# Patient Record
Sex: Male | Born: 1935
Health system: Southern US, Community
[De-identification: ages and names within clinical notes are randomized; demographics above are authoritative.]

## PROBLEM LIST (undated history)

## (undated) DIAGNOSIS — F329 Major depressive disorder, single episode, unspecified: Secondary | ICD-10-CM

## (undated) DIAGNOSIS — Z8249 Family history of ischemic heart disease and other diseases of the circulatory system: Secondary | ICD-10-CM

## (undated) DIAGNOSIS — G459 Transient cerebral ischemic attack, unspecified: Secondary | ICD-10-CM

## (undated) DIAGNOSIS — I350 Nonrheumatic aortic (valve) stenosis: Secondary | ICD-10-CM

## (undated) DIAGNOSIS — Z8781 Personal history of (healed) traumatic fracture: Secondary | ICD-10-CM

## (undated) DIAGNOSIS — F32A Depression, unspecified: Secondary | ICD-10-CM

## (undated) DIAGNOSIS — Z9889 Other specified postprocedural states: Secondary | ICD-10-CM

## (undated) DIAGNOSIS — K219 Gastro-esophageal reflux disease without esophagitis: Secondary | ICD-10-CM

## (undated) DIAGNOSIS — Z860100 Personal history of colon polyps, unspecified: Secondary | ICD-10-CM

## (undated) DIAGNOSIS — R112 Nausea with vomiting, unspecified: Secondary | ICD-10-CM

## (undated) DIAGNOSIS — I10 Essential (primary) hypertension: Secondary | ICD-10-CM

## (undated) DIAGNOSIS — F1011 Alcohol abuse, in remission: Secondary | ICD-10-CM

## (undated) DIAGNOSIS — E7800 Pure hypercholesterolemia, unspecified: Secondary | ICD-10-CM

## (undated) DIAGNOSIS — K5790 Diverticulosis of intestine, part unspecified, without perforation or abscess without bleeding: Secondary | ICD-10-CM

## (undated) DIAGNOSIS — F419 Anxiety disorder, unspecified: Secondary | ICD-10-CM

## (undated) HISTORY — DX: Pure hypercholesterolemia, unspecified: E78.00

## (undated) HISTORY — DX: Personal history of colon polyps, unspecified: Z86.0100

## (undated) HISTORY — PX: COLONOSCOPY: SHX174

## (undated) HISTORY — DX: Alcohol abuse, in remission: F10.11

## (undated) HISTORY — DX: Depression, unspecified: F32.A

## (undated) HISTORY — DX: Anxiety disorder, unspecified: F41.9

## (undated) HISTORY — DX: Essential (primary) hypertension: I10

## (undated) HISTORY — DX: Gastro-esophageal reflux disease without esophagitis: K21.9

## (undated) HISTORY — DX: Diverticulosis of intestine, part unspecified, without perforation or abscess without bleeding: K57.90

## (undated) HISTORY — DX: Major depressive disorder, single episode, unspecified: F32.9

## (undated) HISTORY — DX: Personal history of colonic polyps: Z86.010

## (undated) HISTORY — DX: Personal history of (healed) traumatic fracture: Z87.81

## (undated) HISTORY — DX: Family history of ischemic heart disease and other diseases of the circulatory system: Z82.49

## (undated) HISTORY — DX: Nonrheumatic aortic (valve) stenosis: I35.0

## (undated) HISTORY — DX: Transient cerebral ischemic attack, unspecified: G45.9

---

## 1998-11-23 ENCOUNTER — Ambulatory Visit (HOSPITAL_COMMUNITY): Admission: RE | Admit: 1998-11-23 | Discharge: 1998-11-23 | Payer: Self-pay | Admitting: *Deleted

## 2003-07-25 ENCOUNTER — Ambulatory Visit (HOSPITAL_COMMUNITY): Admission: RE | Admit: 2003-07-25 | Discharge: 2003-07-25 | Payer: Self-pay | Admitting: *Deleted

## 2005-12-04 DIAGNOSIS — G459 Transient cerebral ischemic attack, unspecified: Secondary | ICD-10-CM

## 2005-12-04 HISTORY — DX: Transient cerebral ischemic attack, unspecified: G45.9

## 2005-12-13 ENCOUNTER — Observation Stay (HOSPITAL_COMMUNITY): Admission: EM | Admit: 2005-12-13 | Discharge: 2005-12-14 | Payer: Self-pay | Admitting: Emergency Medicine

## 2005-12-14 ENCOUNTER — Ambulatory Visit: Payer: Self-pay | Admitting: Cardiovascular Disease

## 2010-07-23 ENCOUNTER — Inpatient Hospital Stay (HOSPITAL_COMMUNITY)
Admission: EM | Admit: 2010-07-23 | Discharge: 2010-07-26 | DRG: 603 | Disposition: A | Discharge status: Home / Self Care | Payer: MEDICARE | Attending: Internal Medicine | Admitting: Internal Medicine

## 2010-07-23 DIAGNOSIS — F329 Major depressive disorder, single episode, unspecified: Secondary | ICD-10-CM | POA: Diagnosis present

## 2010-07-23 DIAGNOSIS — I10 Essential (primary) hypertension: Secondary | ICD-10-CM | POA: Diagnosis present

## 2010-07-23 DIAGNOSIS — E785 Hyperlipidemia, unspecified: Secondary | ICD-10-CM | POA: Diagnosis present

## 2010-07-23 DIAGNOSIS — F3289 Other specified depressive episodes: Secondary | ICD-10-CM | POA: Diagnosis present

## 2010-07-23 DIAGNOSIS — W540XXA Bitten by dog, initial encounter: Secondary | ICD-10-CM | POA: Diagnosis present

## 2010-07-23 DIAGNOSIS — L02519 Cutaneous abscess of unspecified hand: Principal | ICD-10-CM | POA: Diagnosis present

## 2010-07-23 DIAGNOSIS — R6883 Chills (without fever): Secondary | ICD-10-CM | POA: Diagnosis present

## 2010-07-23 DIAGNOSIS — S6990XA Unspecified injury of unspecified wrist, hand and finger(s), initial encounter: Secondary | ICD-10-CM | POA: Diagnosis present

## 2010-07-23 DIAGNOSIS — Z8673 Personal history of transient ischemic attack (TIA), and cerebral infarction without residual deficits: Secondary | ICD-10-CM

## 2010-07-23 LAB — CBC
Hemoglobin: 13.8 g/dL (ref 13.0–17.0)
MCHC: 33.3 g/dL (ref 30.0–36.0)
RBC: 4.51 MIL/uL (ref 4.22–5.81)
RDW: 12.5 % (ref 11.5–15.5)
WBC: 7.9 10*3/uL (ref 4.0–10.5)

## 2010-07-23 LAB — DIFFERENTIAL
Basophils Relative: 0 % (ref 0–1)
Eosinophils Absolute: 0.1 10*3/uL (ref 0.0–0.7)
Eosinophils Relative: 1 % (ref 0–5)
Lymphs Abs: 2 10*3/uL (ref 0.7–4.0)
Monocytes Absolute: 0.7 10*3/uL (ref 0.1–1.0)
Monocytes Relative: 9 % (ref 3–12)
Neutro Abs: 5.1 10*3/uL (ref 1.7–7.7)

## 2010-07-23 LAB — COMPREHENSIVE METABOLIC PANEL
ALT: 23 U/L (ref 0–53)
AST: 22 U/L (ref 0–37)
GFR calc Af Amer: >60 mL/min (ref >60)
GFR calc non Af Amer: >60 mL/min (ref >60)
Potassium: 4 mEq/L (ref 3.5–5.1)
Sodium: 136 mEq/L (ref 135–145)
Total Bilirubin: 0.9 mg/dL (ref 0.3–1.2)

## 2010-07-24 ENCOUNTER — Inpatient Hospital Stay (HOSPITAL_COMMUNITY): Payer: MEDICARE

## 2010-07-24 LAB — URINALYSIS, ROUTINE W REFLEX MICROSCOPIC
Nitrite: NEGATIVE
Protein, ur: NEGATIVE mg/dL
Specific Gravity, Urine: 1.021 (ref 1.005–1.030)
Urine Glucose, Fasting: NEGATIVE mg/dL
pH: 5.5 (ref 5.0–8.0)

## 2010-07-24 LAB — CBC
HCT: 39.5 % (ref 39.0–52.0)
Hemoglobin: 13 g/dL (ref 13.0–17.0)
Platelets: 196 10*3/uL (ref 150–400)
WBC: 7.8 10*3/uL (ref 4.0–10.5)

## 2010-07-24 LAB — BASIC METABOLIC PANEL
BUN: 13 mg/dL (ref 6–23)
CO2: 26 mEq/L (ref 19–32)
Glucose, Bld: 103 mg/dL — ABNORMAL HIGH (ref 70–99)
Potassium: 3.8 mEq/L (ref 3.5–5.1)
Sodium: 134 mEq/L — ABNORMAL LOW (ref 135–145)

## 2010-07-24 LAB — URINE MICROSCOPIC-ADD ON: Urine-Other: NONE SEEN

## 2010-07-25 LAB — URINE CULTURE
Colony Count: NO GROWTH
Culture  Setup Time: 201202182024
Culture: NO GROWTH

## 2010-07-25 NOTE — H&P (Signed)
NAME:  Peter Gallegos, Peter Gallegos NO.:  [id number]  MEDICAL RECORD NO.:  [id number]           PATIENT TYPE:  E  LOCATION:  WLED                         FACILITY:  [facility]  PHYSICIAN:  Arne Cleveland, MD       DATE OF BIRTH:  [birth year]  DATE OF ADMISSION:  07/23/2010 DATE OF DISCHARGE:                             HISTORY & PHYSICAL   PRIMARY CARE PHYSICIAN:  Soyla Murphy. Renne Crigler, M.D.  CHIEF COMPLAINT:  Chills.  HISTORY OF PRESENT ILLNESS:  A 77 year old Caucasian male who had a history of dog bite 6 days ago.  He went and saw his primary care doctor 5 days ago and was seen by his PA who started him on Augmentin, Vicodin and Phenergan for the dog bite and wrapped up the hand and forearm because of the swelling.  The patient was doing fairly well and had the wound checked today at the doctor's office and everything seemed to be going well.  He did noted that he had some nausea and less anergy than usual but he felt that might be secondary to the antibiotics that he was on.  The swelling had decreased in size.  The wound looked fairly well. He took the wrapping out and he went home but at home he had 2 spells of bone shaking chills and he got concerned and called his primary care physician and was told to come to the emergency room.  PAST MEDICAL HISTORY:  Significant for: 1. Hypertension. 2. Transient ischemic event. 3. Hyperlipidemia. 4. Depression.  PAST SURGICAL HISTORY:  Negative.  FAMILY HISTORY:  Noncontributory.  SOCIAL HISTORY:  Is married, retired Pensions consultant.  He does not smoke, drink or use illicit drugs.  He does have a history of smoking of pipe for 10 years, he stopped smoking 25 years ago.  REVIEW OF SYSTEMS:  As per history of present illness.  PHYSICAL EXAMINATION:  GENERAL:  Well-developed, well-nourished, no acute distress. VITAL SIGNS:  Blood pressure 131/78, pulse 89, respirations 20, temperature 97.9. HEAD:  Examination of his head is atraumatic,  normocephalic. EYES:  Pupils equal, round, reactive to light.  Disks sharp. Extraocular muscle range of motion full. EAR, NOSE AND THROAT:  Mucous membranes are moist and there is no erythema or exudate or lesion noted.  NECK:  Supple without jugular venous distention, thyromegaly or thyroid mass. CHEST:  Nontender. LUNGS:  Clear to auscultation and percussion. HEART:  Regular rhythm and rate.  Normal S1, S2 without murmur, gallop or rub. ABDOMEN:  Soft, nontender.  Normoactive bowel sound.  No hepatomegaly. No splenomegaly.  No palpable mass.  GENITAL EXAM:  Normal. RECTAL EXAM:  Deferred at the patient's request. EXTREMITIES:  There is no clubbing, cyanosis or edema. NEURO:  Awake, alert and oriented x3.  Cranial nerves II-XII intact. Motor strength 5/5 in upper and lower extremities.  Sensory intact to fine touch and pinprick. SKIN:  His right hand has some swelling, 2 puncture wounds which appear to be healing well and some swelling and mild erythema of the right forearm.  IMPRESSION: 1. Dog bite right hand. 2. Right hand and forearm cellulitis.  3. Chills, new onset rule out sepsis.  PLAN:  My plan is to admit the patient for IV antibiotics.  Obtain blood cultures and monitor his course.  The patient's routine meds will be continued except for the Augmentin that will be discontinued and he will be put on Unasyn.  Further evaluation and treatment as indicated by hospital course.  The patient will be admitted to [facility] 3.     Arne Cleveland, MD     ML/MEDQ  D:  07/23/2010  T:  07/23/2010  Job:  [id number]  cc:   Soyla Murphy. Renne Crigler, M.D. Fax: [phone]  Electronically Signed by Arne Cleveland  on 07/25/2010 06:35:43 AM

## 2010-07-30 LAB — CULTURE, BLOOD (ROUTINE X 2)
Culture  Setup Time: 201202181732
Culture: NO GROWTH

## 2010-08-19 NOTE — Discharge Summary (Signed)
  NAME:  Peter Gallegos, Peter Gallegos                ACCOUNT NO.:  [id number]  MEDICAL RECORD NO.:  [id number]           PATIENT TYPE:  I  LOCATION:  1303                         FACILITY:  [facility]  PHYSICIAN:  Dayshaun Whobrey I Jakwan Sally, MD      DATE OF BIRTH:  10-19-1932  DATE OF ADMISSION:  07/23/2010 DATE OF DISCHARGE:  07/26/2010                              DISCHARGE SUMMARY   DISCHARGE DIAGNOSES: 1. Right hand dog bite. 2. Right hand cellulitis secondary to dog bite. 3. Febrile illness secondary to dog bite and cellulitis. 4. History of transient ischemic attack. 5. History of hypertension. 6. Depression and hyperlipidemia.  PROCEDURE:  X-ray of the right hand soft tissue swelling over the dorsum hand without underlying fracture, mild osteopenia without focal bone destruction to suggest osteomyelitis.  HISTORY OF PRESENT ILLNESS:  This is a 77 year old male who had a history of dog bite 6 days prior to admission.  He went and saw his primary care doctor who has started him on Augmentin, Vicodin, and Phenergan.  The patient was doing fairly well and had the wound checked today at the doctor's office and every thing seemed to be doing well. He did noted that he had some nausea and less energy.  He presented with 2 spells of bone-shaking chills and he called his primary care physician.  He asked him to come to the emergency room.  In emergency room, the patient was found to have temperature 97.9, blood pressure 131/78.  He noticed he had some right hand swelling and 2 punctate wounds, which appears to be healing well and some swelling and erythema of his right forearm.  PROBLEM LIST:  Right hand cellulitis, which was treated with Unasyn IV. Blood cultures were negative.  Urine cultures were negative.  Right hand did not show any evidence of cellulitis and his hand significantly improved.  He continued to have low grade fever.  He was kept during this period for close observation and the patient had no  further fever. There is evidence of sepsis.  The patient was advised to follow up with his primary care physician next week.  On examination of his right hand, the swelling decreased and redness decreased.  There is no evidence of compartment syndrome.  DISCHARGE MEDICATIONS: 1. Aggrenox 1 capsule twice daily. 2. Augmentin 1 tablet b.i.d. for 7 days. 3. Vicodin 1 tablet q.8 h. as needed. 4. Ibuprofen q.8 h. as needed. 5. Zocor 80 mg p.o. daily. 6. Ramipril/hydrochlorothiazide 7.5/25 mg 1 tablet daily. 7. Wellbutrin XL 300 mg p.o. daily. 8. Zoloft 50 mg p.o. daily.  As I mentioned, blood culture negative, his LFTs were normal.     Emry Tobin Bosie Helper, MD     HIE/MEDQ  D:  08/18/2010  T:  08/19/2010  Job:  [id number]  cc:   Soyla Murphy. Renne Crigler, M.D. Fax: [phone]  Electronically Signed by Ebony Cargo MD on 08/19/2010 10:04:09 AM

## 2013-02-13 ENCOUNTER — Encounter: Payer: Self-pay | Admitting: Physician Assistant

## 2013-02-14 ENCOUNTER — Encounter: Payer: Self-pay | Admitting: *Deleted

## 2013-02-18 ENCOUNTER — Encounter: Payer: Self-pay | Admitting: Physician Assistant

## 2013-02-18 ENCOUNTER — Other Ambulatory Visit: Payer: Self-pay | Admitting: *Deleted

## 2013-02-18 ENCOUNTER — Ambulatory Visit (INDEPENDENT_AMBULATORY_CARE_PROVIDER_SITE_OTHER): Payer: Medicare Other | Admitting: Physician Assistant

## 2013-02-18 ENCOUNTER — Encounter: Payer: Self-pay | Admitting: Internal Medicine

## 2013-02-18 VITALS — BP 118/68 | HR 64 | Ht 62.0 in | Wt 171.4 lb

## 2013-02-18 DIAGNOSIS — D369 Benign neoplasm, unspecified site: Secondary | ICD-10-CM

## 2013-02-18 DIAGNOSIS — Z8673 Personal history of transient ischemic attack (TIA), and cerebral infarction without residual deficits: Secondary | ICD-10-CM

## 2013-02-18 DIAGNOSIS — E785 Hyperlipidemia, unspecified: Secondary | ICD-10-CM | POA: Insufficient documentation

## 2013-02-18 DIAGNOSIS — F32A Depression, unspecified: Secondary | ICD-10-CM | POA: Insufficient documentation

## 2013-02-18 DIAGNOSIS — Z86010 Personal history of colonic polyps: Secondary | ICD-10-CM

## 2013-02-18 DIAGNOSIS — Z7901 Long term (current) use of anticoagulants: Secondary | ICD-10-CM

## 2013-02-18 DIAGNOSIS — F329 Major depressive disorder, single episode, unspecified: Secondary | ICD-10-CM

## 2013-02-18 MED ORDER — MOVIPREP 100 G PO SOLR: 1.0000 | Freq: Once | ORAL | Status: DC

## 2013-02-18 NOTE — Progress Notes (Signed)
Subjective:    Patient ID: Peter Gallegos, male    DOB: 09-23-1932, 79 y.o.   MRN: [id number]  HPI  Peter Gallegos is a pleasant 79 year old white male, and new to GI today referred by Dr. Renne Crigler for colon neoplasia surveillance. Patient is previously known to Dr. Virginia Rochester and had colonoscopy in 2000 and with removal of 1 adenomatous polyp. He had followup colonoscopy in February 2005 and was noted to have scattered diverticuli throughout the colon but no recurrent polyps He was recommended for 5 year followup but has not had another procedure since then . He does have history of a TIA about 7 years ago and has been on Aggrenox. He has not had any recurrent events He says he feels well and has no current GI symptoms. Specifically no complaints of abdominal pain change in bowel habits, melena, hematochezia, or dysphagia. His  appetite has been fine and his weight has been stable.    Review of Systems  Constitutional: Negative.   HENT: Negative.   Eyes: Negative.   Respiratory: Negative.   Cardiovascular: Negative.   Gastrointestinal: Negative.   Endocrine: Negative.   Genitourinary: Negative.   Musculoskeletal: Negative.   Skin: Negative.   Allergic/Immunologic: Negative.   Neurological: Negative.   Hematological: Negative.   Psychiatric/Behavioral: Negative.    Outpatient Prescriptions Prior to Visit  Medication Sig Dispense Refill  . buPROPion (WELLBUTRIN XL) 300 MG 24 hr tablet Take 300 mg by mouth daily.      Marland Kitchen dipyridamole-aspirin (AGGRENOX) 200-25 MG per 12 hr capsule Take 1 capsule by mouth 2 (two) times daily.      . Multiple Vitamin (MULTIVITAMIN) capsule Take 1 capsule by mouth daily.      . Omega-3 Fatty Acids (FISH OIL) 500 MG CAPS Take 1 capsule by mouth 2 (two) times daily before a meal.      . sertraline (ZOLOFT) 50 MG tablet Take 50 mg by mouth daily.      . Sildenafil Citrate (VIAGRA PO) Take by mouth daily.      . simvastatin (ZOCOR) 80 MG tablet Take 80 mg by mouth at  bedtime. Take 1/2 tab once a day      . triamterene-hydrochlorothiazide (MAXZIDE-25) 37.5-25 MG per tablet Take 1 tablet by mouth daily.       No facility-administered medications prior to visit.   No Known Allergies Patient Active Problem List   Diagnosis Date Noted  . H/O adenomatous polyp of colon 02/18/2013  . Hx of transient ischemic attack (TIA) 02/18/2013  . Depression 02/18/2013  . Other and unspecified hyperlipidemia 02/18/2013   History  Substance Use Topics  . Smoking status: Former Smoker    Types: Pipe  . Smokeless tobacco: Not on file     Comment: Quit smoking pipe years ago  . Alcohol Use: Not on file    family history includes Heart disease in his father and mother.      Objective:   Physical Exam   well-developed older white male in no acute distress, pleasant. Blood pressure 118/68 pulse 64 height 5 foot 2 weight 171. HEENT; nontraumatic normocephalic EOMI PERRLA sclera anicteric, Neck; supple no JVD, Cardiovascula;r regular rate and rhythm with S1-S2 no murmur or gallop, Pulmonary; clear, Abdomen ;soft nontender nondistended bowel sounds are active there is no palpable mass or hepatomegaly bowel sounds are active, Rectal; exam not done recently done per Dr. Talbert Forest Hemoccult negative, Extremities; no clubbing cyanosis or edema skin warm and dry, Psych; mood and affect normal and  appropriate        Assessment & Plan:  #65  79 year old white male with history of adenomatous colon polyp in 2000 and  negative colonoscopy 2005-referred for followup colonoscopy. Patient is asymptomatic #2 chronic antiplatelet therapy with Aggrenox #3 history of TIA 7 years ago #4 hyperlipidemia #5 history of depression  Plan; patient is scheduled for colonoscopy with Dr. Benjamine Mola discussed in detail with the patient and he is agreeable to proceed. We will obtain consent from Dr. Renne Crigler for patient to stop Aggrenox 3 days prior to his procedure-relative risk benefits of  holding antiplatelet therapy were discussed with the patient as well.

## 2013-02-18 NOTE — Patient Instructions (Addendum)
You have been scheduled for a colonoscopy with propofol. Please follow written instructions given to you at your visit today.  Please pick up your prep kit at the pharmacy within the next 1-3 days. If you use inhalers (even only as needed), please bring them with you on the day of your procedure. We will call you once we hear from Dr. Merri Brunette on the aggrenox instructions.

## 2013-02-20 ENCOUNTER — Telehealth: Payer: Self-pay | Admitting: *Deleted

## 2013-02-20 NOTE — Telephone Encounter (Signed)
I called the patient to advise that Dr. Merri Brunette responded and agreed that the patient can hold the Aggrenox for 3 days prior to the procedure date.  I told him to not take the medication on 9-30, 10-1, 10-2, 10-3, and resume it on 10-4. The patient repeated this back to me and understood the instructions.  ( Sent the documented anticoagulation letter to be scanned).

## 2013-02-20 NOTE — Progress Notes (Signed)
Agree with Ms. Oswald Hillock assessment and plan. He is close to 10 yrs from last colonoscopy (no polyps) so reasonable to proceed w/ repeat colonoscopy. Will probably be last routine exam unless major findings. Iva Boop, MD, [facility]

## 2013-03-08 ENCOUNTER — Encounter: Payer: Self-pay | Admitting: Internal Medicine

## 2013-03-08 ENCOUNTER — Ambulatory Visit (AMBULATORY_SURGERY_CENTER): Payer: Medicare Other | Admitting: Internal Medicine

## 2013-03-08 VITALS — BP 137/80 | HR 70 | Temp 96.7°F | Resp 24 | Ht 62.0 in | Wt 171.0 lb

## 2013-03-08 DIAGNOSIS — Z86010 Personal history of colonic polyps: Secondary | ICD-10-CM

## 2013-03-08 DIAGNOSIS — K5730 Diverticulosis of large intestine without perforation or abscess without bleeding: Secondary | ICD-10-CM

## 2013-03-08 DIAGNOSIS — D126 Benign neoplasm of colon, unspecified: Secondary | ICD-10-CM

## 2013-03-08 DIAGNOSIS — K648 Other hemorrhoids: Secondary | ICD-10-CM

## 2013-03-08 MED ORDER — SODIUM CHLORIDE 0.9 % IV SOLN: 500.0000 mL | INTRAVENOUS | Status: DC

## 2013-03-08 NOTE — Patient Instructions (Addendum)
I found and removed 6 polyps - they all look benign. You also have diverticulosis and hemorrhoids. The prostate is enlarged. Please be sure to keep follow-up of the prostate with Dr. Renne Crigler or urology.  I doubt you will need another routine colonoscopy. I will let you know pathology results.  I appreciate the opportunity to care for you. Iva Boop, MD, FACG   YOU HAD AN ENDOSCOPIC PROCEDURE TODAY AT THE [facility]: Refer to the procedure report that was given to you for any specific questions about what was found during the examination.  If the procedure report does not answer your questions, please call your gastroenterologist to clarify.  If you requested that your care partner not be given the details of your procedure findings, then the procedure report has been included in a sealed envelope for you to review at your convenience later.  YOU SHOULD EXPECT: Some feelings of bloating in the abdomen. Passage of more gas than usual.  Walking can help get rid of the air that was put into your GI tract during the procedure and reduce the bloating. If you had a lower endoscopy (such as a colonoscopy or flexible sigmoidoscopy) you may notice spotting of blood in your stool or on the toilet paper. If you underwent a bowel prep for your procedure, then you may not have a normal bowel movement for a few days.  DIET: Your first meal following the procedure should be a light meal and then it is ok to progress to your normal diet.  A half-sandwich or bowl of soup is an example of a good first meal.  Heavy or fried foods are harder to digest and may make you feel nauseous or bloated.  Likewise meals heavy in dairy and vegetables can cause extra gas to form and this can also increase the bloating.  Drink plenty of fluids but you should avoid alcoholic beverages for 24 hours.  ACTIVITY: Your care partner should take you home directly after the procedure.  You should plan to take it easy, moving  slowly for the rest of the day.  You can resume normal activity the day after the procedure however you should NOT DRIVE or use heavy machinery for 24 hours (because of the sedation medicines used during the test).    SYMPTOMS TO REPORT IMMEDIATELY: A gastroenterologist can be reached at any hour.  During normal business hours, 8:30 AM to 5:00 PM Monday through Friday, call [phone].  After hours and on weekends, please call the GI answering service at [phone] who will take a message and have the physician on call contact you.   Following lower endoscopy (colonoscopy or flexible sigmoidoscopy):  Excessive amounts of blood in the stool  Significant tenderness or worsening of abdominal pains  Swelling of the abdomen that is new, acute  Fever of 100F or higher FOLLOW UP: If any biopsies were taken you will be contacted by phone or by letter within the next 1-3 weeks.  Call your gastroenterologist if you have not heard about the biopsies in 3 weeks.  Our staff will call the home number listed on your records the next business day following your procedure to check on you and address any questions or concerns that you may have at that time regarding the information given to you following your procedure. This is a courtesy call and so if there is no answer at the home number and we have not heard from you through the emergency physician  on call, we will assume that you have returned to your regular daily activities without incident.  SIGNATURES/CONFIDENTIALITY: You and/or your care partner have signed paperwork which will be entered into your electronic medical record.  These signatures attest to the fact that that the information above on your After Visit Summary has been reviewed and is understood.  Full responsibility of the confidentiality of this discharge information lies with you and/or your care-partner.

## 2013-03-08 NOTE — Op Note (Addendum)
[facility] [address] [place], [address]   COLONOSCOPY PROCEDURE REPORT  PATIENT: Peter Gallegos, Peter Gallegos  MR#: [id number] BIRTHDATE: 06/25/1932 , 80  yrs. old GENDER: Male ENDOSCOPIST: Iva Boop, MD, [facility] REFERRED XB:JYNWGN Terri Piedra, M.D. PROCEDURE DATE:  03/08/2013 PROCEDURE:   Colonoscopy with biopsy and snare polypectomy First Screening Colonoscopy - Avg.  risk and is 50 yrs.  old or older - No.  Prior Negative Screening - Now for repeat screening. N/A  History of Adenoma - Now for follow-up colonoscopy & has been > or = to 3 yrs.  Yes hx of adenoma.  Has been 3 or more years since last colonoscopy.  Polyps Removed Today? Yes. ASA CLASS:   Class III INDICATIONS:Patient's personal history of adenomatous colon polyps.  MEDICATIONS: propofol (Diprivan) 250mg  IV, MAC sedation, administered by CRNA, and These medications were titrated to patient response per physician's verbal order  DESCRIPTION OF PROCEDURE:   After the risks benefits and alternatives of the procedure were thoroughly explained, informed consent was obtained.  A digital rectal exam revealed no prostatic nodules.   The LB [id number]  endoscope was introduced through the anus and advanced to the cecum, which was identified by both the appendix and ileocecal valve. No adverse events experienced.   The quality of the prep was Suprep good  The instrument was then slowly withdrawn as the colon was fully examined.   COLON FINDINGS: Six sessile polyps measuring 2-7 mm in size were found at the cecum, in the ascending colon, descending colon, and sigmoid colon.  A polypectomy was performed with cold forceps (2 x 2 mm polyps) and with a cold snare (4 others)  The resection was complete and the polyp tissue was completely retrieved.   Severe diverticulosis was noted The finding was in the left colon.   The colon mucosa was otherwise normal.  Retroflexed views revealed internal hemorrhoids. The  time to cecum=3 minutes 37 seconds. Withdrawal time=14 minutes 45 seconds.  The scope was withdrawn and the procedure completed. COMPLICATIONS: There were no complications.  ENDOSCOPIC IMPRESSION: 1.   Six sessile polyps measuring 2-7 mm in size were found at the cecum, in the ascending colon, descending colon, and sigmoid colon; polypectomy was performed with cold forceps and with a cold snare 2.   Severe diverticulosis was noted in the left colon 3.   The colon mucosa was otherwise normal - good prep 4.   Internal hemorrhoids  RECOMMENDATIONS: 1.  Await pathology results 2.   Resume Aggrenox tomorrow. May not need routine repeat colonoscopy - suspect not.  eSigned:  Iva Boop, MD, [facility] 03/08/2013 3:29 PMRevised: 03/08/2013 3:29 PM cc: Romero Liner, MD and The Patient

## 2013-03-08 NOTE — Progress Notes (Signed)
Report to pacu rn,, vss, bbs=clear 

## 2013-03-08 NOTE — Progress Notes (Signed)
Patient did not have preoperative order for IV antibiotic SSI prophylaxis. (G8918)  Patient did not experience any of the following events: a burn prior to discharge; a fall within the facility; wrong site/side/patient/procedure/implant event; or a hospital transfer or hospital admission upon discharge from the facility. (G8907)  

## 2013-03-08 NOTE — Progress Notes (Signed)
Called to room to assist during endoscopic procedure.  Patient ID and intended procedure confirmed with present staff. Received instructions for my participation in the procedure from the performing physician.  

## 2013-03-11 ENCOUNTER — Telehealth: Payer: Self-pay | Admitting: *Deleted

## 2013-03-11 NOTE — Telephone Encounter (Signed)
  Follow up Call-  Call back number 03/08/2013  Post procedure Call Back phone  # [phone]  Permission to leave phone message Yes     Patient questions:  Do you have a fever, pain , or abdominal swelling? no Pain Score  0 *  Have you tolerated food without any problems? yes  Have you been able to return to your normal activities? yes  Do you have any questions about your discharge instructions: Diet   no Medications  no Follow up visit  no  Do you have questions or concerns about your Care? no  Actions: * If pain score is 4 or above: No action needed, pain <4.

## 2013-03-15 ENCOUNTER — Encounter: Payer: Self-pay | Admitting: Internal Medicine

## 2013-03-15 NOTE — Progress Notes (Signed)
Quick Note:  6 adenomas No recall due to age ______

## 2013-11-20 ENCOUNTER — Emergency Department (HOSPITAL_COMMUNITY): Payer: Medicare Other

## 2013-11-20 ENCOUNTER — Encounter (HOSPITAL_COMMUNITY): Payer: Self-pay | Admitting: Emergency Medicine

## 2013-11-20 ENCOUNTER — Emergency Department (HOSPITAL_COMMUNITY)
Admission: EM | Admit: 2013-11-20 | Discharge: 2013-11-20 | Disposition: A | Discharge status: Home / Self Care | Payer: Medicare Other | Attending: Emergency Medicine | Admitting: Emergency Medicine

## 2013-11-20 DIAGNOSIS — W540XXA Bitten by dog, initial encounter: Secondary | ICD-10-CM | POA: Insufficient documentation

## 2013-11-20 DIAGNOSIS — Z8719 Personal history of other diseases of the digestive system: Secondary | ICD-10-CM | POA: Insufficient documentation

## 2013-11-20 DIAGNOSIS — Z87828 Personal history of other (healed) physical injury and trauma: Secondary | ICD-10-CM | POA: Insufficient documentation

## 2013-11-20 DIAGNOSIS — F3289 Other specified depressive episodes: Secondary | ICD-10-CM | POA: Insufficient documentation

## 2013-11-20 DIAGNOSIS — Z23 Encounter for immunization: Secondary | ICD-10-CM | POA: Insufficient documentation

## 2013-11-20 DIAGNOSIS — IMO0001 Reserved for inherently not codable concepts without codable children: Secondary | ICD-10-CM

## 2013-11-20 DIAGNOSIS — E7800 Pure hypercholesterolemia, unspecified: Secondary | ICD-10-CM | POA: Insufficient documentation

## 2013-11-20 DIAGNOSIS — F329 Major depressive disorder, single episode, unspecified: Secondary | ICD-10-CM | POA: Insufficient documentation

## 2013-11-20 DIAGNOSIS — Z860100 Personal history of colon polyps, unspecified: Secondary | ICD-10-CM | POA: Insufficient documentation

## 2013-11-20 DIAGNOSIS — Y929 Unspecified place or not applicable: Secondary | ICD-10-CM | POA: Insufficient documentation

## 2013-11-20 DIAGNOSIS — Z792 Long term (current) use of antibiotics: Secondary | ICD-10-CM | POA: Insufficient documentation

## 2013-11-20 DIAGNOSIS — S62639A Displaced fracture of distal phalanx of unspecified finger, initial encounter for closed fracture: Secondary | ICD-10-CM | POA: Insufficient documentation

## 2013-11-20 DIAGNOSIS — I10 Essential (primary) hypertension: Secondary | ICD-10-CM | POA: Insufficient documentation

## 2013-11-20 DIAGNOSIS — Z87891 Personal history of nicotine dependence: Secondary | ICD-10-CM | POA: Insufficient documentation

## 2013-11-20 DIAGNOSIS — F411 Generalized anxiety disorder: Secondary | ICD-10-CM | POA: Insufficient documentation

## 2013-11-20 DIAGNOSIS — Z8673 Personal history of transient ischemic attack (TIA), and cerebral infarction without residual deficits: Secondary | ICD-10-CM | POA: Insufficient documentation

## 2013-11-20 DIAGNOSIS — Z79899 Other long term (current) drug therapy: Secondary | ICD-10-CM | POA: Insufficient documentation

## 2013-11-20 DIAGNOSIS — Y9389 Activity, other specified: Secondary | ICD-10-CM | POA: Insufficient documentation

## 2013-11-20 LAB — BASIC METABOLIC PANEL
BUN: 13 mg/dL (ref 6–23)
CO2: 30 mEq/L (ref 19–32)
Calcium: 9.4 mg/dL (ref 8.4–10.5)
Chloride: 105 mEq/L (ref 96–112)
Creatinine, Ser: 1.01 mg/dL (ref 0.50–1.35)
GFR calc non Af Amer: 69 mL/min — ABNORMAL LOW (ref >90)
GFR, EST AFRICAN AMERICAN: 79 mL/min — AB (ref >90)
Glucose, Bld: 105 mg/dL — ABNORMAL HIGH (ref 70–99)
POTASSIUM: 4.1 meq/L (ref 3.7–5.3)
SODIUM: 146 meq/L (ref 137–147)

## 2013-11-20 LAB — CBC WITH DIFFERENTIAL/PLATELET
BASOS ABS: 0 10*3/uL (ref 0.0–0.1)
BASOS PCT: 0 % (ref 0–1)
EOS ABS: 0.1 10*3/uL (ref 0.0–0.7)
EOS PCT: 1 % (ref 0–5)
HCT: 43.8 % (ref 39.0–52.0)
Hemoglobin: 14.4 g/dL (ref 13.0–17.0)
LYMPHS ABS: 1.6 10*3/uL (ref 0.7–4.0)
Lymphocytes Relative: 17 % (ref 12–46)
MCH: 30 pg (ref 26.0–34.0)
MCHC: 32.9 g/dL (ref 30.0–36.0)
MCV: 91.3 fL (ref 78.0–100.0)
Monocytes Absolute: 0.5 10*3/uL (ref 0.1–1.0)
Monocytes Relative: 6 % (ref 3–12)
NEUTROS PCT: 76 % (ref 43–77)
Neutro Abs: 6.8 10*3/uL (ref 1.7–7.7)
PLATELETS: 193 10*3/uL (ref 150–400)
RBC: 4.8 MIL/uL (ref 4.22–5.81)
RDW: 13.7 % (ref 11.5–15.5)
WBC: 9 10*3/uL (ref 4.0–10.5)

## 2013-11-20 LAB — PROTIME-INR
INR: 0.97 (ref 0.00–1.49)
PROTHROMBIN TIME: 12.7 s (ref 11.6–15.2)

## 2013-11-20 MED ORDER — HYDROCODONE-ACETAMINOPHEN 5-325 MG PO TABS: ORAL_TABLET | ORAL | Status: DC

## 2013-11-20 MED ORDER — LIDOCAINE-EPINEPHRINE 2 %-1:100000 IJ SOLN
20.0000 mL | Freq: Once | INTRAMUSCULAR | Status: DC
Start: 2013-11-20 — End: 2013-11-20
  Filled 2013-11-20: qty 20

## 2013-11-20 MED ORDER — AMOXICILLIN-POT CLAVULANATE 875-125 MG PO TABS
1.0000 | ORAL_TABLET | Freq: Once | ORAL | Status: AC
  Administered 2013-11-20: 1 via ORAL
  Filled 2013-11-20: qty 1

## 2013-11-20 MED ORDER — AMOXICILLIN-POT CLAVULANATE 875-125 MG PO TABS: 1.0000 | ORAL_TABLET | Freq: Two times a day (BID) | ORAL | Status: DC

## 2013-11-20 MED ORDER — TETANUS-DIPHTH-ACELL PERTUSSIS 5-2.5-18.5 LF-MCG/0.5 IM SUSP
0.5000 mL | Freq: Once | INTRAMUSCULAR | Status: AC
  Administered 2013-11-20: 0.5 mL via INTRAMUSCULAR
  Filled 2013-11-20: qty 0.5

## 2013-11-20 MED ORDER — SODIUM CHLORIDE 0.9 % IV SOLN
3.0000 g | Freq: Once | INTRAVENOUS | Status: AC
  Administered 2013-11-20: 3 g via INTRAVENOUS
  Filled 2013-11-20: qty 3

## 2013-11-20 NOTE — ED Provider Notes (Signed)
CSN: [id number]     Arrival date & time 11/20/13  1454 History   First MD Initiated Contact with Patient 11/20/13 1457     Chief Complaint  Patient presents with  . Finger Injury     (Consider location/radiation/quality/duration/timing/severity/associated sxs/prior Treatment) HPI  Peter Gallegos is a 80 y.o. male with past medical history significant for hypertension, high cholesterol, TIA since the ED via private urgent care for evaluation of the left fourth digit dog bite (Shihtzu). Dogs were known, they are his, they're up-to-date on vaccinations. Pain is minimal (patient received fentanyl via EMS in route) last tetanus shot is unknown. Patient is anticoagulated with Aggrenox for TIA, bleeding controlled. Patient is right-hand dominant. Patient had orange sherbet for breakfast at 8 AM.  Past Medical History  Diagnosis Date  . Hypertension   . Hypercholesterolemia   . Anxiety and depression   . History of colon polyps   . GERD (gastroesophageal reflux disease)   . TIA (transient ischemic attack) 12/2005  . Diverticulosis   . Family history of coronary artery disease   . History of ETOH abuse     Quit in 1980  . History of arm fracture     Rt arm, LT elbow fracture   Past Surgical History  Procedure Laterality Date  . Colonoscopy     Family History  Problem Relation Age of Onset  . Heart disease Father     Deceased 69 ( Had MI )  . Heart disease Mother    History  Substance Use Topics  . Smoking status: Former Smoker    Types: Pipe  . Smokeless tobacco: Not on file     Comment: Quit smoking pipe years ago  . Alcohol Use: No    Review of Systems  10 systems reviewed and found to be negative, except as noted in the HPI.   Allergies  Review of patient's allergies indicates no known allergies.  Home Medications   Prior to Admission medications   Medication Sig Start Date End Date Taking? Authorizing Provider  buPROPion (WELLBUTRIN XL) 300 MG 24 hr tablet  Take 300 mg by mouth daily.   Yes Historical Provider, MD  dipyridamole-aspirin (AGGRENOX) 200-25 MG per 12 hr capsule Take 1 capsule by mouth 2 (two) times daily.   Yes Historical Provider, MD  Multiple Vitamin (MULTIVITAMIN) capsule Take 1 capsule by mouth daily.   Yes Historical Provider, MD  Omega-3 Fatty Acids (FISH OIL) 500 MG CAPS Take 1 capsule by mouth 2 (two) times daily before a meal.   Yes Historical Provider, MD  sertraline (ZOLOFT) 50 MG tablet Take 50 mg by mouth daily.   Yes Historical Provider, MD  Sildenafil Citrate (VIAGRA PO) Take 1 tablet by mouth daily as needed (for erectile dysfunction).    Yes Historical Provider, MD  simvastatin (ZOCOR) 80 MG tablet Take 80 mg by mouth at bedtime. Take 1/2 tab once a day   Yes Historical Provider, MD  triamterene-hydrochlorothiazide (MAXZIDE-25) 37.5-25 MG per tablet Take 1 tablet by mouth daily.   Yes Historical Provider, MD  amoxicillin-clavulanate (AUGMENTIN) 875-125 MG per tablet Take 1 tablet by mouth 2 (two) times daily. One po bid x 7 days 11/20/13   Peter Ricks Dezarae Mcclaran, PA-C  HYDROcodone-acetaminophen (NORCO/VICODIN) 5-325 MG per tablet Take 1-2 tablets by mouth every 6 hours as needed for pain. 11/20/13   Peter Chea, PA-C   BP 146/76  Pulse 71  Temp(Src) 98.1 F (36.7 C) (Oral)  Resp 23  Ht 5\' 6"  (  1.676 m)  Wt 150 lb (68.04 kg)  BMI 24.22 kg/m2  SpO2 98% Physical Exam  Nursing note and vitals reviewed. Constitutional: He is oriented to person, place, and time. He appears well-developed and well-nourished. No distress.  HENT:  Head: Normocephalic and atraumatic.  Mouth/Throat: Oropharynx is clear and moist.  Eyes: Conjunctivae and EOM are normal. Pupils are equal, round, and reactive to light.  Cardiovascular: Normal rate, regular rhythm and intact distal pulses.   Pulmonary/Chest: Effort normal and breath sounds normal. No stridor.  Musculoskeletal: Normal range of motion.       Hands: Bleeding controlled, full  thickness laceration to the radial side of the distal phalanx of the left fourth digit. No range of motion to DIP. Distal sensation is grossly intact.  On wound exploration bone is visible.   Neurological: He is alert and oriented to person, place, and time.  Psychiatric: He has a normal mood and affect.    ED Course  NERVE BLOCK Date/Time: 11/20/2013 4:48 PM Performed by: Monico Blitz Authorized by: Monico Blitz Consent: Verbal consent obtained. Risks and benefits: risks, benefits and alternatives were discussed Consent given by: patient Patient identity confirmed: arm band and verbally with patient Indications: pain relief and extensive wound Body area: upper extremity Nerve: digital Laterality: left Patient sedated: no Patient position: sitting Needle gauge: 44 G Location technique: anatomical landmarks Local anesthetic: lidocaine 2% without epinephrine Anesthetic total: 4 ml Outcome: pain improved Patient tolerance: Patient tolerated the procedure well with no immediate complications.    LACERATION REPAIR Performed by: Monico Blitz Consent: Verbal consent obtained. Risks and benefits: risks, benefits and alternatives were discussed Consent given by: patient Patient identity confirmed: Wrist band   Wound explored to depth in good light on a bloodless field, with no foreign bodies seen or palpated.  Prepped and draped in normal sterile fashion    Tetanus: Up dated today  Laceration Location: Left fourth digit dorsal side, distal phalanx  Laceration Length: 2 cm  Anesthesia: Digital block  Local anesthetic: 2% lidocaine without epinephrine  Anesthetic total: 4 ml  Irrigation method: Low Pressure  Amount of cleaning: 1L sterile NS  Skin closure: 5-0 Ethilon  Number of sutures: 3  Technique: Simple interrupted  Patient tolerance: Patient tolerated the procedure well with no immediate complications.   Antibx ointment applied. Instructions for care  discussed verbally and patient provided with additional written instructions for homecare and f/u.  Labs Review Labs Reviewed  BASIC METABOLIC PANEL - Abnormal; Notable for the following:    Glucose, Bld 105 (*)    GFR calc non Af Amer 69 (*)    GFR calc Af Amer 79 (*)    All other components within normal limits  CBC WITH DIFFERENTIAL  PROTIME-INR    Imaging Review Dg Finger Ring Left  11/20/2013   CLINICAL DATA:  Dog bite.  EXAM: LEFT RING FINGER 2+V  COMPARISON:  None.  FINDINGS: Soft tissue swelling. No radiopaque foreign bodies. Displaced fracture of the distal phalanx of the left fourth digit is present .  IMPRESSION: Displaced fracture of the distal phalanx of left fourth digit. Fracture is displaced volarly. No radiopaque foreign bodies.   Electronically Signed   By: Marcello Moores  Register   On: 11/20/2013 16:04     EKG Interpretation None      MDM   Final diagnoses:  Open fracture of phalanx of fourth finger of left hand    Filed Vitals:   11/20/13 1611 11/20/13 1630 11/20/13 1700 11/20/13  1722  BP: 129/73 131/71 146/66 146/76  Pulse: 69 65 76 71  Temp:      TempSrc:      Resp: 18 15 18 23   Height:      Weight:      SpO2: 99% 94% 96% 98%    Medications  lidocaine-EPINEPHrine (XYLOCAINE W/EPI) 2 %-1:100000 (with pres) injection 20 mL (not administered)  Ampicillin-Sulbactam (UNASYN) 3 g in sodium chloride 0.9 % 100 mL IVPB (not administered)  Tdap (BOOSTRIX) injection 0.5 mL (0.5 mLs Intramuscular Given 11/20/13 1612)  amoxicillin-clavulanate (AUGMENTIN) 875-125 MG per tablet 1 tablet (1 tablet Oral Given 11/20/13 1611)    Peter Gallegos is a 80 y.o. male presenting with dog bite an open fracture to left fourth digit. Wound irrigated under low pressure. Patient given Unasyn IV.   Consult from hand surgeon Dr. Apolonio Schneiders appreciated: Recommend closing it loosely, agrees with Unasyn. Will see him in the office tomorrow asked to advise the patient to remain n.p.o.  after midnight.  Wound closed with 3 sutures. Patient placed in a static finger splint and plan discussed  Evaluation does not show pathology that would require ongoing emergent intervention or inpatient treatment. Pt is hemodynamically stable and mentating appropriately. Discussed findings and plan with patient/guardian, who agrees with care plan. All questions answered. Return precautions discussed and outpatient follow up given.   New Prescriptions   AMOXICILLIN-CLAVULANATE (AUGMENTIN) 875-125 MG PER TABLET    Take 1 tablet by mouth 2 (two) times daily. One po bid x 7 days   HYDROCODONE-ACETAMINOPHEN (NORCO/VICODIN) 5-325 MG PER TABLET    Take 1-2 tablets by mouth every 6 hours as needed for pain.         Monico Blitz, PA-C 11/20/13 [phone]

## 2013-11-20 NOTE — ED Notes (Signed)
Pt returned from xray

## 2013-11-20 NOTE — ED Notes (Signed)
Peter Gallegos, wife - [phone].

## 2013-11-20 NOTE — ED Notes (Signed)
Per [facility], pt was at home, has two dogs whos vaccines are up to date. Broke up fight between dogs, dog bit patients left ring finger. Was told finger is nearly amputated. [facility], was transferred here to see hand specialist. Pt has bandage and splint applied to finger, no bleeding or drainage noted to the bandage. Pt given 100 of fentanyl by ems, pain 2/10 at this time. Pt is AAOX4, in NAD.

## 2013-11-20 NOTE — Discharge Instructions (Signed)
Do not eat or drink anything after midnight because she will likely go to the operating room tomorrow. Call Dr. Lequita Asal office first thing in the morning to set up an appointment for evaluation tomorrow.   Do not hesitate to return to the emergency room for any new, worsening or concerning symptoms.  Elevate the finger above the level of the heart disease swelling in control pain.  Take vicodin for breakthrough pain, do not drink alcohol, drive, care for children or do other critical tasks while taking vicodin.  Please follow with your primary care doctor in the next 2 days for a check-up. They must obtain records for further management.

## 2013-11-20 NOTE — ED Notes (Signed)
Pt states he had TIA about 5-6 years ago and has been on blood thinners since.

## 2013-11-20 NOTE — ED Notes (Signed)
Suture cart set up at bedside  

## 2013-11-21 ENCOUNTER — Encounter (HOSPITAL_COMMUNITY): Payer: Medicare Other | Admitting: Certified Registered Nurse Anesthetist

## 2013-11-21 ENCOUNTER — Ambulatory Visit (HOSPITAL_COMMUNITY)
Admission: AD | Admit: 2013-11-21 | Discharge: 2013-11-21 | Disposition: A | Discharge status: Home / Self Care | Payer: Medicare Other | Source: Ambulatory Visit | Attending: Orthopedic Surgery | Admitting: Orthopedic Surgery

## 2013-11-21 ENCOUNTER — Ambulatory Visit (HOSPITAL_COMMUNITY): Payer: Medicare Other | Admitting: Certified Registered Nurse Anesthetist

## 2013-11-21 ENCOUNTER — Ambulatory Visit (HOSPITAL_COMMUNITY): Payer: Medicare Other

## 2013-11-21 ENCOUNTER — Encounter (HOSPITAL_COMMUNITY): Payer: Self-pay | Admitting: *Deleted

## 2013-11-21 ENCOUNTER — Encounter (HOSPITAL_COMMUNITY)
Admission: AD | Disposition: A | Discharge status: Home / Self Care | Payer: Self-pay | Source: Ambulatory Visit | Attending: Orthopedic Surgery

## 2013-11-21 DIAGNOSIS — E7800 Pure hypercholesterolemia, unspecified: Secondary | ICD-10-CM | POA: Insufficient documentation

## 2013-11-21 DIAGNOSIS — Z8673 Personal history of transient ischemic attack (TIA), and cerebral infarction without residual deficits: Secondary | ICD-10-CM | POA: Insufficient documentation

## 2013-11-21 DIAGNOSIS — F411 Generalized anxiety disorder: Secondary | ICD-10-CM | POA: Insufficient documentation

## 2013-11-21 DIAGNOSIS — W540XXA Bitten by dog, initial encounter: Secondary | ICD-10-CM | POA: Insufficient documentation

## 2013-11-21 DIAGNOSIS — K219 Gastro-esophageal reflux disease without esophagitis: Secondary | ICD-10-CM | POA: Insufficient documentation

## 2013-11-21 DIAGNOSIS — F3289 Other specified depressive episodes: Secondary | ICD-10-CM | POA: Insufficient documentation

## 2013-11-21 DIAGNOSIS — S62639B Displaced fracture of distal phalanx of unspecified finger, initial encounter for open fracture: Secondary | ICD-10-CM | POA: Insufficient documentation

## 2013-11-21 DIAGNOSIS — F329 Major depressive disorder, single episode, unspecified: Secondary | ICD-10-CM | POA: Insufficient documentation

## 2013-11-21 DIAGNOSIS — S61209A Unspecified open wound of unspecified finger without damage to nail, initial encounter: Secondary | ICD-10-CM | POA: Insufficient documentation

## 2013-11-21 DIAGNOSIS — I10 Essential (primary) hypertension: Secondary | ICD-10-CM | POA: Insufficient documentation

## 2013-11-21 DIAGNOSIS — Z87891 Personal history of nicotine dependence: Secondary | ICD-10-CM | POA: Insufficient documentation

## 2013-11-21 DIAGNOSIS — K5730 Diverticulosis of large intestine without perforation or abscess without bleeding: Secondary | ICD-10-CM | POA: Insufficient documentation

## 2013-11-21 HISTORY — DX: Other specified postprocedural states: Z98.890

## 2013-11-21 HISTORY — DX: Depression, unspecified: F32.A

## 2013-11-21 HISTORY — DX: Major depressive disorder, single episode, unspecified: F32.9

## 2013-11-21 HISTORY — DX: Other specified postprocedural states: R11.2

## 2013-11-21 HISTORY — PX: OPEN REDUCTION INTERNAL FIXATION (ORIF) DISTAL PHALANX: SHX6236

## 2013-11-21 LAB — BASIC METABOLIC PANEL
BUN: 11 mg/dL (ref 6–23)
CALCIUM: 9.5 mg/dL (ref 8.4–10.5)
CO2: 30 mEq/L (ref 19–32)
Chloride: 100 mEq/L (ref 96–112)
Creatinine, Ser: 0.92 mg/dL (ref 0.50–1.35)
GFR calc Af Amer: >90 mL/min (ref >90)
GFR, EST NON AFRICAN AMERICAN: 78 mL/min — AB (ref >90)
Glucose, Bld: 92 mg/dL (ref 70–99)
Potassium: 4.3 mEq/L (ref 3.7–5.3)
SODIUM: 141 meq/L (ref 137–147)

## 2013-11-21 LAB — CBC
HEMATOCRIT: 43.4 % (ref 39.0–52.0)
Hemoglobin: 14.6 g/dL (ref 13.0–17.0)
MCH: 30.7 pg (ref 26.0–34.0)
MCHC: 33.6 g/dL (ref 30.0–36.0)
MCV: 91.4 fL (ref 78.0–100.0)
Platelets: 195 10*3/uL (ref 150–400)
RBC: 4.75 MIL/uL (ref 4.22–5.81)
RDW: 13.9 % (ref 11.5–15.5)
WBC: 9.3 10*3/uL (ref 4.0–10.5)

## 2013-11-21 SURGERY — OPEN REDUCTION INTERNAL FIXATION (ORIF) DISTAL PHALANX
Anesthesia: Monitor Anesthesia Care | Site: Finger | Laterality: Left

## 2013-11-21 MED ORDER — OXYCODONE HCL 5 MG PO TABS: 5.0000 mg | ORAL_TABLET | Freq: Once | ORAL | Status: DC | PRN

## 2013-11-21 MED ORDER — DOCUSATE SODIUM 100 MG PO CAPS: 100.0000 mg | ORAL_CAPSULE | Freq: Two times a day (BID) | ORAL | Status: DC

## 2013-11-21 MED ORDER — PROPOFOL 10 MG/ML IV BOLUS
INTRAVENOUS | Status: AC
  Filled 2013-11-21: qty 20

## 2013-11-21 MED ORDER — MIDAZOLAM HCL 5 MG/5ML IJ SOLN
INTRAMUSCULAR | Status: DC | PRN
  Administered 2013-11-21 (×2): 1 mg via INTRAVENOUS

## 2013-11-21 MED ORDER — LIDOCAINE HCL (PF) 1 % IJ SOLN
INTRAMUSCULAR | Status: AC
  Filled 2013-11-21: qty 30

## 2013-11-21 MED ORDER — FENTANYL CITRATE 0.05 MG/ML IJ SOLN: 25.0000 ug | INTRAMUSCULAR | Status: DC | PRN

## 2013-11-21 MED ORDER — FENTANYL CITRATE 0.05 MG/ML IJ SOLN
INTRAMUSCULAR | Status: AC
  Filled 2013-11-21: qty 5

## 2013-11-21 MED ORDER — CEFAZOLIN SODIUM-DEXTROSE 2-3 GM-% IV SOLR
2.0000 g | INTRAVENOUS | Status: AC
  Administered 2013-11-21: 2 g via INTRAVENOUS
  Filled 2013-11-21: qty 50

## 2013-11-21 MED ORDER — FENTANYL CITRATE 0.05 MG/ML IJ SOLN
INTRAMUSCULAR | Status: DC | PRN
  Administered 2013-11-21: 25 ug via INTRAVENOUS
  Administered 2013-11-21: 50 ug via INTRAVENOUS
  Administered 2013-11-21: 25 ug via INTRAVENOUS

## 2013-11-21 MED ORDER — ONDANSETRON HCL 4 MG/2ML IJ SOLN: 4.0000 mg | Freq: Four times a day (QID) | INTRAMUSCULAR | Status: DC | PRN

## 2013-11-21 MED ORDER — LIDOCAINE HCL (PF) 1 % IJ SOLN
INTRAMUSCULAR | Status: DC | PRN
  Administered 2013-11-21: 15 mL

## 2013-11-21 MED ORDER — MIDAZOLAM HCL 2 MG/2ML IJ SOLN
INTRAMUSCULAR | Status: AC
  Filled 2013-11-21: qty 2

## 2013-11-21 MED ORDER — ROCURONIUM BROMIDE 50 MG/5ML IV SOLN
INTRAVENOUS | Status: AC
  Filled 2013-11-21: qty 1

## 2013-11-21 MED ORDER — BUPIVACAINE HCL (PF) 0.25 % IJ SOLN
INTRAMUSCULAR | Status: DC | PRN
  Administered 2013-11-21: 15 mL

## 2013-11-21 MED ORDER — LIDOCAINE HCL (CARDIAC) 20 MG/ML IV SOLN
INTRAVENOUS | Status: AC
  Filled 2013-11-21: qty 5

## 2013-11-21 MED ORDER — ONDANSETRON HCL 4 MG/2ML IJ SOLN
INTRAMUSCULAR | Status: DC | PRN
  Administered 2013-11-21: 4 mg via INTRAVENOUS

## 2013-11-21 MED ORDER — LACTATED RINGERS IV SOLN
INTRAVENOUS | Status: DC | PRN
  Administered 2013-11-21: 15:00:00 via INTRAVENOUS

## 2013-11-21 MED ORDER — ONDANSETRON HCL 4 MG/2ML IJ SOLN
INTRAMUSCULAR | Status: AC
  Filled 2013-11-21: qty 2

## 2013-11-21 MED ORDER — PROPOFOL 10 MG/ML IV BOLUS
INTRAVENOUS | Status: DC | PRN
  Administered 2013-11-21 (×2): 20 mg via INTRAVENOUS

## 2013-11-21 MED ORDER — OXYCODONE HCL 5 MG/5ML PO SOLN: 5.0000 mg | Freq: Once | ORAL | Status: DC | PRN

## 2013-11-21 MED ORDER — LACTATED RINGERS IV SOLN
INTRAVENOUS | Status: DC
  Administered 2013-11-21: 15:00:00 via INTRAVENOUS

## 2013-11-21 MED ORDER — CHLORHEXIDINE GLUCONATE 4 % EX LIQD
60.0000 mL | Freq: Once | CUTANEOUS | Status: DC
  Filled 2013-11-21: qty 60

## 2013-11-21 MED ORDER — BUPIVACAINE HCL (PF) 0.25 % IJ SOLN
INTRAMUSCULAR | Status: AC
  Filled 2013-11-21: qty 30

## 2013-11-21 MED ORDER — HYDROCODONE-ACETAMINOPHEN 5-300 MG PO TABS: 1.0000 | ORAL_TABLET | Freq: Four times a day (QID) | ORAL | Status: DC | PRN

## 2013-11-21 SURGICAL SUPPLY — 54 items
BANDAGE ELASTIC 3 VELCRO ST LF (GAUZE/BANDAGES/DRESSINGS) IMPLANT
BANDAGE ELASTIC 4 VELCRO ST LF (GAUZE/BANDAGES/DRESSINGS) IMPLANT
BANDAGE GAUZE ELAST BULKY 4 IN (GAUZE/BANDAGES/DRESSINGS) IMPLANT
BNDG CMPR 9X4 STRL LF SNTH (GAUZE/BANDAGES/DRESSINGS) ×1
BNDG COHESIVE 1X5 TAN STRL LF (GAUZE/BANDAGES/DRESSINGS) ×3 IMPLANT
BNDG CONFORM 2 STRL LF (GAUZE/BANDAGES/DRESSINGS) ×3 IMPLANT
BNDG ELASTIC 2 VLCR STRL LF (GAUZE/BANDAGES/DRESSINGS) ×3 IMPLANT
BNDG ESMARK 4X9 LF (GAUZE/BANDAGES/DRESSINGS) ×3 IMPLANT
CAP PIN ORTHO PINK (CAP) IMPLANT
CAP PIN PROTECTOR ORTHO WHT (CAP) IMPLANT
CORDS BIPOLAR (ELECTRODE) ×3 IMPLANT
COVER SURGICAL LIGHT HANDLE (MISCELLANEOUS) ×3 IMPLANT
CUFF TOURNIQUET SINGLE 18IN (TOURNIQUET CUFF) ×3 IMPLANT
CUFF TOURNIQUET SINGLE 24IN (TOURNIQUET CUFF) IMPLANT
DRAPE OEC MINIVIEW 54X84 (DRAPES) IMPLANT
DRAPE SURG 17X23 STRL (DRAPES) ×3 IMPLANT
DRSG ADAPTIC 3X8 NADH LF (GAUZE/BANDAGES/DRESSINGS) IMPLANT
GAUZE SPONGE 2X2 8PLY STRL LF (GAUZE/BANDAGES/DRESSINGS) IMPLANT
GAUZE XEROFORM 1X8 LF (GAUZE/BANDAGES/DRESSINGS) ×3 IMPLANT
GLOVE BIOGEL PI IND STRL 8.5 (GLOVE) ×1 IMPLANT
GLOVE BIOGEL PI INDICATOR 8.5 (GLOVE) ×2
GLOVE SURG ORTHO 8.0 STRL STRW (GLOVE) ×3 IMPLANT
GOWN STRL REUS W/ TWL LRG LVL3 (GOWN DISPOSABLE) ×2 IMPLANT
GOWN STRL REUS W/ TWL XL LVL3 (GOWN DISPOSABLE) ×1 IMPLANT
GOWN STRL REUS W/TWL LRG LVL3 (GOWN DISPOSABLE) ×6
GOWN STRL REUS W/TWL XL LVL3 (GOWN DISPOSABLE) ×3
K-WIRE DBL TROCAR .045X4 ×3 IMPLANT
K-WIRE SMTH SNGL TROCAR .028X4 (WIRE)
KIT BASIN OR (CUSTOM PROCEDURE TRAY) ×3 IMPLANT
KIT ROOM TURNOVER OR (KITS) ×3 IMPLANT
KWIRE DBL TROCAR .045X4 ×1 IMPLANT
KWIRE SMTH SNGL TROCAR .028X4 (WIRE) IMPLANT
MANIFOLD NEPTUNE II (INSTRUMENTS) ×3 IMPLANT
NEEDLE HYPO 25GX1X1/2 BEV (NEEDLE) IMPLANT
NS IRRIG 1000ML POUR BTL (IV SOLUTION) ×3 IMPLANT
PACK ORTHO EXTREMITY (CUSTOM PROCEDURE TRAY) ×3 IMPLANT
PAD ARMBOARD 7.5X6 YLW CONV (MISCELLANEOUS) ×6 IMPLANT
PAD CAST 4YDX4 CTTN HI CHSV (CAST SUPPLIES) IMPLANT
PADDING CAST COTTON 4X4 STRL (CAST SUPPLIES)
PADDING UNDERCAST 2  STERILE (CAST SUPPLIES) ×3 IMPLANT
SOAP 2 % CHG 4 OZ (WOUND CARE) ×3 IMPLANT
SPONGE GAUZE 2X2 STER 10/PKG (GAUZE/BANDAGES/DRESSINGS)
SPONGE GAUZE 4X4 12PLY (GAUZE/BANDAGES/DRESSINGS) IMPLANT
SPONGE GAUZE 4X4 12PLY STER LF (GAUZE/BANDAGES/DRESSINGS) ×3 IMPLANT
SUCTION FRAZIER TIP 10 FR DISP (SUCTIONS) IMPLANT
SUT MERSILENE 4 0 P 3 (SUTURE) IMPLANT
SUT PROLENE 4 0 PS 2 18 (SUTURE) IMPLANT
SYR CONTROL 10ML LL (SYRINGE) IMPLANT
TOWEL OR 17X24 6PK STRL BLUE (TOWEL DISPOSABLE) ×3 IMPLANT
TOWEL OR 17X26 10 PK STRL BLUE (TOWEL DISPOSABLE) ×3 IMPLANT
TUBE CONNECTING 12'X1/4 (SUCTIONS)
TUBE CONNECTING 12X1/4 (SUCTIONS) IMPLANT
UNDERPAD 30X30 INCONTINENT (UNDERPADS AND DIAPERS) ×3 IMPLANT
WATER STERILE IRR 1000ML POUR (IV SOLUTION) ×3 IMPLANT

## 2013-11-21 NOTE — Anesthesia Procedure Notes (Signed)
Procedure Name: MAC Date/Time: 11/21/2013 4:40 PM Performed by: Blair Heys E Pre-anesthesia Checklist: Patient identified, Emergency Drugs available, Suction available and Patient being monitored Patient Re-evaluated:Patient Re-evaluated prior to inductionOxygen Delivery Method: Nasal cannula Preoxygenation: Pre-oxygenation with 100% oxygen Placement Confirmation: positive ETCO2 and breath sounds checked- equal and bilateral Dental Injury: Teeth and Oropharynx as per pre-operative assessment

## 2013-11-21 NOTE — Brief Op Note (Signed)
11/21/2013  4:30 PM  PATIENT:  Rosanne Ashing  80 y.o. male  PRE-OPERATIVE DIAGNOSIS:  left ring finger distal phalynx fracture  POST-OPERATIVE DIAGNOSIS: SAME  PROCEDURE:  Procedure(s): OPEN REDUCTION INTERNAL FIXATION (ORIF) DISTAL PHALANX (Left)  SURGEON:  Surgeon(s) and Role:    * Linna Hoff, MD - Primary  PHYSICIAN ASSISTANT:   ASSISTANTS: none   ANESTHESIA:   local and general  EBL:     BLOOD ADMINISTERED:none  DRAINS: none   LOCAL MEDICATIONS USED:  MARCAINE     SPECIMEN:  No Specimen  DISPOSITION OF SPECIMEN:  N/A  COUNTS:  YES  TOURNIQUET:    DICTATION: .Other Dictation: Dictation Number [id number]  PLAN OF CARE: Discharge to home after PACU  PATIENT DISPOSITION:  PACU - hemodynamically stable.   Delay start of Pharmacological VTE agent (>24hrs) due to surgical blood loss or risk of bleeding: not applicable

## 2013-11-21 NOTE — Transfer of Care (Signed)
Immediate Anesthesia Transfer of Care Note  Patient: Peter Gallegos  Procedure(s) Performed: Procedure(s): OPEN REDUCTION INTERNAL FIXATION (ORIF) DISTAL PHALANX (Left)  Patient Location: PACU  Anesthesia Type:MAC  Level of Consciousness: awake, alert  and oriented  Airway & Oxygen Therapy: Patient Spontanous Breathing and Patient connected to nasal cannula oxygen  Post-op Assessment: Report given to PACU RN, Post -op Vital signs reviewed and stable and Patient moving all extremities X 4  Post vital signs: Reviewed and stable  Complications: No apparent anesthesia complications

## 2013-11-21 NOTE — Discharge Instructions (Signed)
KEEP BANDAGE CLEAN AND DRY CALL OFFICE FOR F/U APPT [phone] DR [facility] CELL PHONE [phone] KEEP HAND ELEVATED ABOVE HEART OK TO APPLY ICE TO OPERATIVE AREA CONTACT OFFICE IF ANY WORSENING PAIN OR CONCERNS.

## 2013-11-21 NOTE — H&P (Signed)
Peter Gallegos is an 80 y.o. male.   Chief Complaint: left ring finger injury HPI: Peter Gallegos is a 80 y.o. male with past medical history significant for hypertension, high cholesterol, TIA since the ED via private urgent care for evaluation of the left fourth digit dog bite (Shihtzu). Dogs were known, they are his, they're up-to-date on vaccinations. Pain is minimal (patient received fentanyl via EMS in route) last tetanus shot is unknown. Patient is anticoagulated with Aggrenox for TIA, bleeding controlled. Patient is right-hand dominant. Pt seen/examined in my office today. Pt here for surgery. No prior surgery to left hand   Past Medical History  Diagnosis Date  . Hypertension   . Hypercholesterolemia   . Anxiety and depression   . History of colon polyps   . GERD (gastroesophageal reflux disease)   . TIA (transient ischemic attack) 12/2005  . Diverticulosis   . Family history of coronary artery disease   . History of ETOH abuse     Quit in 1980  . History of arm fracture     Rt arm, LT elbow fracture  . PONV (postoperative nausea and vomiting)   . Depression     Past Surgical History  Procedure Laterality Date  . Colonoscopy      Family History  Problem Relation Age of Onset  . Heart disease Father     Deceased 31 ( Had MI )  . Heart disease Mother    Social History:  reports that he has quit smoking. His smoking use included Pipe. He does not have any smokeless tobacco history on file. He reports that he does not drink alcohol or use illicit drugs.  Allergies: No Known Allergies  Medications Prior to Admission  Medication Sig Dispense Refill  . amoxicillin-clavulanate (AUGMENTIN) 875-125 MG per tablet Take 1 tablet by mouth 2 (two) times daily. One po bid x 7 days  14 tablet  0  . buPROPion (WELLBUTRIN XL) 300 MG 24 hr tablet Take 300 mg by mouth daily.      Marland Kitchen dipyridamole-aspirin (AGGRENOX) 200-25 MG per 12 hr capsule Take 1 capsule by mouth 2 (two)  times daily.      Marland Kitchen HYDROcodone-acetaminophen (NORCO/VICODIN) 5-325 MG per tablet Take 1-2 tablets by mouth every 6 hours as needed for pain.  15 tablet  0  . Multiple Vitamin (MULTIVITAMIN) capsule Take 1 capsule by mouth daily.      . Omega-3 Fatty Acids (FISH OIL) 500 MG CAPS Take 1 capsule by mouth 2 (two) times daily before a meal.      . sertraline (ZOLOFT) 50 MG tablet Take 50 mg by mouth daily.      . simvastatin (ZOCOR) 80 MG tablet Take 80 mg by mouth at bedtime. Take 1/2 tab once a day      . triamterene-hydrochlorothiazide (MAXZIDE-25) 37.5-25 MG per tablet Take 1 tablet by mouth daily.      . Sildenafil Citrate (VIAGRA PO) Take 1 tablet by mouth daily as needed (for erectile dysfunction).         Results for orders placed during the hospital encounter of 11/21/13 (from the past 48 hour(s))  BASIC METABOLIC PANEL     Status: Abnormal   Collection Time    11/21/13  2:24 PM      Result Value Ref Range   Sodium 141  137 - 147 mEq/L   Potassium 4.3  3.7 - 5.3 mEq/L   Chloride 100  96 - 112 mEq/L   CO2  30  19 - 32 mEq/L   Glucose, Bld 92  70 - 99 mg/dL   BUN 11  6 - 23 mg/dL   Creatinine, Ser 0.92  0.50 - 1.35 mg/dL   Calcium 9.5  8.4 - 10.5 mg/dL   GFR calc non Af Amer 78 (*) >90 mL/min   GFR calc Af Amer >90  >90 mL/min   Comment: (NOTE)     The eGFR has been calculated using the CKD EPI equation.     This calculation has not been validated in all clinical situations.     eGFR's persistently <90 mL/min signify possible Chronic Kidney     Disease.  CBC     Status: None   Collection Time    11/21/13  2:24 PM      Result Value Ref Range   WBC 9.3  4.0 - 10.5 K/uL   RBC 4.75  4.22 - 5.81 MIL/uL   Hemoglobin 14.6  13.0 - 17.0 g/dL   HCT 43.4  39.0 - 52.0 %   MCV 91.4  78.0 - 100.0 fL   MCH 30.7  26.0 - 34.0 pg   MCHC 33.6  30.0 - 36.0 g/dL   RDW 13.9  11.5 - 15.5 %   Platelets 195  150 - 400 K/uL   Dg Chest 2 View  11/21/2013   CLINICAL DATA:  Preoperative evaluation  for ORIF of a distal phalanx, history hypertension, TIA, smoking  EXAM: CHEST  2 VIEW  COMPARISON:  11/19/2009  FINDINGS: Normal heart size and pulmonary vascularity.  Calcified tortuous aorta.  Minimal eventration anterior RIGHT diaphragm noted.  Lungs clear.  No pleural effusion or pneumothorax.  Bones unremarkable.  IMPRESSION: No acute abnormalities.   Electronically Signed   By: Lavonia Dana M.D.   On: 11/21/2013 15:13   Dg Finger Ring Left  11/20/2013   CLINICAL DATA:  Dog bite.  EXAM: LEFT RING FINGER 2+V  COMPARISON:  None.  FINDINGS: Soft tissue swelling. No radiopaque foreign bodies. Displaced fracture of the distal phalanx of the left fourth digit is present .  IMPRESSION: Displaced fracture of the distal phalanx of left fourth digit. Fracture is displaced volarly. No radiopaque foreign bodies.   Electronically Signed   By: Marcello Moores  Register   On: 11/20/2013 16:04    ROS NO RECENT ILLNESSES OR HOSPITALIZATIONS  Blood pressure 131/69, pulse 71, temperature 98.1 F (36.7 C), temperature source Oral, resp. rate 16, SpO2 97.00%. Physical Exam  General Appearance:  Alert, cooperative, no distress, appears stated age  Head:  Normocephalic, without obvious abnormality, atraumatic  Eyes:  Pupils equal, conjunctiva/corneas clear,         Throat: Lips, mucosa, and tongue normal; teeth and gums normal  Neck: No visible masses     Lungs:   respirations unlabored  Chest Wall:  No tenderness or deformity  Heart:  Regular rate and rhythm,  Abdomen:   Soft, non-tender,         Extremities: LEFT RING FINGER, OPEN WOUND DORSUM OF HAND, EXTENDS TO MIDAXIAL LINE ON RADIAL SIDE FINGER WARM WELL PERFUSED ABLE TO FLEX DIP AND PIP JOINTS GOOD MP JOINTS  Pulses: 2+ and symmetric  Skin: Skin color, texture, turgor normal, no rashes or lesions     Neurologic: Normal    Assessment/Plan LEFT RING FINGER OPEN DISTAL PHALANX FRACTURE, DOG BITE  LEFT RING FINGER DEBRIDEMENT AND OPEN REPAIR AS  INDICATED  R/B/A DISCUSSED WITH PT IN OFFICE.  PT VOICED UNDERSTANDING OF PLAN  CONSENT SIGNED DAY OF SURGERY PT SEEN AND EXAMINED PRIOR TO OPERATIVE PROCEDURE/DAY OF SURGERY SITE MARKED. QUESTIONS ANSWERED WILL GO HOME FOLLOWING SURGERY  Linna Hoff 11/21/2013, 4:27 PM

## 2013-11-21 NOTE — Anesthesia Preprocedure Evaluation (Addendum)
Anesthesia Evaluation  Patient identified by MRN, date of birth, ID band Patient awake    Reviewed: Allergy & Precautions, H&P , NPO status , Patient's Chart, lab work & pertinent test results  History of Anesthesia Complications (+) PONV  Airway Mallampati: II TM Distance: >3 FB Neck ROM: full    Dental  (+) Missing, Poor Dentition, Edentulous Lower,    Pulmonary former smoker,          Cardiovascular hypertension,     Neuro/Psych Depression TIA   GI/Hepatic GERD-  ,  Endo/Other    Renal/GU      Musculoskeletal   Abdominal   Peds  Hematology   Anesthesia Other Findings   Reproductive/Obstetrics                         Anesthesia Physical Anesthesia Plan  ASA: II  Anesthesia Plan: MAC   Post-op Pain Management:    Induction: Intravenous  Airway Management Planned: Nasal Cannula and Natural Airway  Additional Equipment:   Intra-op Plan:   Post-operative Plan:   Informed Consent: I have reviewed the patients History and Physical, chart, labs and discussed the procedure including the risks, benefits and alternatives for the proposed anesthesia with the patient or authorized representative who has indicated his/her understanding and acceptance.     Plan Discussed with: CRNA, Anesthesiologist and Surgeon  Anesthesia Plan Comments:        Anesthesia Quick Evaluation

## 2013-11-22 NOTE — ED Provider Notes (Signed)
Medical screening examination/treatment/procedure(s) were conducted as a shared visit with non-physician practitioner(s) and myself.  I personally evaluated the patient during the encounter.   EKG Interpretation None       Patient with dog bite to finger. Antibiotics, loose closure and f/u with Hand tomorrow.  Ephraim Hamburger, MD 11/22/13 [phone]

## 2013-11-22 NOTE — Op Note (Signed)
NAME:  Peter Gallegos, Peter Gallegos NO.:  [id number]  MEDICAL RECORD NO.:  [id number]  LOCATION:  [facility]                         FACILITY:  [place]  PHYSICIAN:  Melrose Nakayama, MD  DATE OF BIRTH:  [birth year]  DATE OF PROCEDURE:  11/21/2013 DATE OF DISCHARGE:  11/21/2013                              OPERATIVE REPORT   PREOPERATIVE DIAGNOSIS:  Left ring finger open distal phalanx fracture from a dog bite.  POSTOPERATIVE DIAGNOSIS:  Left ring finger open distal phalanx fracture from a dog bite.  ATTENDING PHYSICIAN:  Linna Hoff IV, MD, who scrubbed and present for the entire procedure.  ASSISTANT SURGEON:  None.  ANESTHESIA:  Xylocaine 1%, 0.25% Marcaine, local block with IV sedation.  SURGICAL PROCEDURE: 1. Open debridement of skin and subcutaneous tissue, and bone     associated with open fracture, left ring finger. 2. Left ring finger open treatment of distal phalanx fracture     requiring internal fixation. 3. Repair of extensor tendon left ring finger, zone 1. 4. Radiographs 2 views, left small finger.  SURGICAL IMPLANTS:  0.045 K-wire.  SURGICAL INDICATIONS:  Peter Gallegos is a 80 year old gentleman, who was bit by his dog yesterday, presented to the office today with the open distal phalanx fracture.  The patient was seen and evaluated in the office and recommended to undergo the above procedure.  Risks, benefits, and alternatives were discussed in detail with the patient and signed informed consent was obtained.  Risks include, but not limited to bleeding, infection, damage to nearby nerves, arteries, or tendons, loss of motion of wrist and digits, incomplete relief of symptoms, and need for further surgical intervention.  DESCRIPTION OF PROCEDURE:  The patient was properly identified in the preop holding area, marked with a permanent marker made on the left ring finger to indicate correct operative site.  The patient was brought back to the operating room,  placed supine on anesthesia table where the IV sedation was administered.  A well-padded tourniquet placed on left brachium sealed with 1000 drape.  Left upper extremity was then prepped and draped in normal sterile fashion.  A time-out was called and correct site was identified, and the procedure was then begun.  Attention was then turned to the left ring finger.  After the local anesthetic was administered, the transverse laceration was opened up.  Excisional debridement was then carried out to skin, subcutaneous tissue and bone done by small curettes and rongeurs and debriding the devitalized tissue.  Excisional debridement was then carried out.  The open fracture site was then thoroughly irrigated.  Following this, the skin incision was extended proximally to expose the extensor mechanism.  The 0.045 K- wire was then placed in antegrade and then placed in retrograde across the distal interphalangeal joint with good purchase.  This K-wire was then cut and then left underneath the skin.  Following this, after open treatment of the distal phalanx fracture, extensor mechanism was repaired with 3-0 Vicryl suture and the wound was then irrigated.  The skin was then closed using simple Prolene sutures.  A Xeroform dressing was then applied.  Sterile compressive bandage applied.  Tourniquet deflated good perfusion of  the finger.  The patient tolerated the procedure well, was placed in a small finger splint and taken to recovery room in good condition.  INTRAOPERATIVE RADIOGRAPHS:  Two views of the finger do show the K-wire fixation in place in good position.  POSTPROCEDURE PLAN:  The patient discharged home and seen back in the office in 1 week for wound check, application of a small protective splint, with the therapist and then seen at the 3-week mark and the 6- week mark, pinned out likely at the 6-week mark, and begin a zone 1 extensor tendon repair protocol. Radiographs at each  visit.     Melrose Nakayama, MD     FWO/MEDQ  D:  11/21/2013  T:  11/22/2013  Job:  [id number]

## 2013-11-22 NOTE — Anesthesia Postprocedure Evaluation (Signed)
Anesthesia Post Note  Patient: Peter Gallegos  Procedure(s) Performed: Procedure(s) (LRB): OPEN REDUCTION INTERNAL FIXATION (ORIF) DISTAL PHALANX (Left)  Anesthesia type: MAC  Patient location: PACU  Post pain: Pain level controlled and Adequate analgesia  Post assessment: Post-op Vital signs reviewed, Patient's Cardiovascular Status Stable and Respiratory Function Stable  Last Vitals:  Filed Vitals:   11/21/13 1815  BP:   Pulse: 63  Temp: 36.9 C  Resp: 15    Post vital signs: Reviewed and stable  Level of consciousness: awake, alert  and oriented  Complications: No apparent anesthesia complications

## 2013-11-24 ENCOUNTER — Encounter (HOSPITAL_COMMUNITY): Payer: Self-pay | Admitting: Orthopedic Surgery

## 2014-07-08 DIAGNOSIS — E780 Pure hypercholesterolemia: Secondary | ICD-10-CM | POA: Diagnosis not present

## 2014-07-08 DIAGNOSIS — K219 Gastro-esophageal reflux disease without esophagitis: Secondary | ICD-10-CM | POA: Diagnosis not present

## 2014-07-08 DIAGNOSIS — Z0000 Encounter for general adult medical examination without abnormal findings: Secondary | ICD-10-CM | POA: Diagnosis not present

## 2014-07-08 DIAGNOSIS — I10 Essential (primary) hypertension: Secondary | ICD-10-CM | POA: Diagnosis not present

## 2014-07-14 DIAGNOSIS — Z23 Encounter for immunization: Secondary | ICD-10-CM | POA: Diagnosis not present

## 2014-07-14 DIAGNOSIS — E780 Pure hypercholesterolemia: Secondary | ICD-10-CM | POA: Diagnosis not present

## 2014-07-14 DIAGNOSIS — Z1212 Encounter for screening for malignant neoplasm of rectum: Secondary | ICD-10-CM | POA: Diagnosis not present

## 2014-07-14 DIAGNOSIS — L570 Actinic keratosis: Secondary | ICD-10-CM | POA: Diagnosis not present

## 2014-07-14 DIAGNOSIS — I10 Essential (primary) hypertension: Secondary | ICD-10-CM | POA: Diagnosis not present

## 2014-07-14 DIAGNOSIS — Z0000 Encounter for general adult medical examination without abnormal findings: Secondary | ICD-10-CM | POA: Diagnosis not present

## 2014-12-24 DIAGNOSIS — R110 Nausea: Secondary | ICD-10-CM | POA: Diagnosis not present

## 2014-12-24 DIAGNOSIS — T63481A Toxic effect of venom of other arthropod, accidental (unintentional), initial encounter: Secondary | ICD-10-CM | POA: Diagnosis not present

## 2014-12-24 DIAGNOSIS — R208 Other disturbances of skin sensation: Secondary | ICD-10-CM | POA: Diagnosis not present

## 2015-07-10 DIAGNOSIS — E7800 Pure hypercholesterolemia, unspecified: Secondary | ICD-10-CM | POA: Diagnosis not present

## 2015-07-10 DIAGNOSIS — I10 Essential (primary) hypertension: Secondary | ICD-10-CM | POA: Diagnosis not present

## 2015-07-17 DIAGNOSIS — I10 Essential (primary) hypertension: Secondary | ICD-10-CM | POA: Diagnosis not present

## 2015-07-17 DIAGNOSIS — Z1212 Encounter for screening for malignant neoplasm of rectum: Secondary | ICD-10-CM | POA: Diagnosis not present

## 2015-07-17 DIAGNOSIS — E291 Testicular hypofunction: Secondary | ICD-10-CM | POA: Diagnosis not present

## 2015-07-17 DIAGNOSIS — E7800 Pure hypercholesterolemia, unspecified: Secondary | ICD-10-CM | POA: Diagnosis not present

## 2015-09-01 DIAGNOSIS — R110 Nausea: Secondary | ICD-10-CM | POA: Diagnosis not present

## 2015-09-01 DIAGNOSIS — J111 Influenza due to unidentified influenza virus with other respiratory manifestations: Secondary | ICD-10-CM | POA: Diagnosis not present

## 2016-07-25 DIAGNOSIS — Z0000 Encounter for general adult medical examination without abnormal findings: Secondary | ICD-10-CM | POA: Diagnosis not present

## 2016-07-25 DIAGNOSIS — E7800 Pure hypercholesterolemia, unspecified: Secondary | ICD-10-CM | POA: Diagnosis not present

## 2016-07-25 DIAGNOSIS — I10 Essential (primary) hypertension: Secondary | ICD-10-CM | POA: Diagnosis not present

## 2016-07-29 DIAGNOSIS — Z1212 Encounter for screening for malignant neoplasm of rectum: Secondary | ICD-10-CM | POA: Diagnosis not present

## 2016-07-29 DIAGNOSIS — I10 Essential (primary) hypertension: Secondary | ICD-10-CM | POA: Diagnosis not present

## 2016-07-29 DIAGNOSIS — Z7901 Long term (current) use of anticoagulants: Secondary | ICD-10-CM | POA: Diagnosis not present

## 2016-07-29 DIAGNOSIS — Z0000 Encounter for general adult medical examination without abnormal findings: Secondary | ICD-10-CM | POA: Diagnosis not present

## 2016-08-11 DIAGNOSIS — L570 Actinic keratosis: Secondary | ICD-10-CM | POA: Diagnosis not present

## 2016-08-11 DIAGNOSIS — L821 Other seborrheic keratosis: Secondary | ICD-10-CM | POA: Diagnosis not present

## 2016-08-11 DIAGNOSIS — C44319 Basal cell carcinoma of skin of other parts of face: Secondary | ICD-10-CM | POA: Diagnosis not present

## 2016-08-11 DIAGNOSIS — D225 Melanocytic nevi of trunk: Secondary | ICD-10-CM | POA: Diagnosis not present

## 2016-08-11 DIAGNOSIS — X32XXXA Exposure to sunlight, initial encounter: Secondary | ICD-10-CM | POA: Diagnosis not present

## 2016-08-11 DIAGNOSIS — B078 Other viral warts: Secondary | ICD-10-CM | POA: Diagnosis not present

## 2016-08-29 DIAGNOSIS — R9431 Abnormal electrocardiogram [ECG] [EKG]: Secondary | ICD-10-CM | POA: Diagnosis not present

## 2016-08-29 DIAGNOSIS — I493 Ventricular premature depolarization: Secondary | ICD-10-CM | POA: Diagnosis not present

## 2016-08-29 DIAGNOSIS — Z7901 Long term (current) use of anticoagulants: Secondary | ICD-10-CM | POA: Diagnosis not present

## 2016-08-29 DIAGNOSIS — I10 Essential (primary) hypertension: Secondary | ICD-10-CM | POA: Diagnosis not present

## 2016-08-31 DIAGNOSIS — R002 Palpitations: Secondary | ICD-10-CM | POA: Diagnosis not present

## 2016-08-31 DIAGNOSIS — R9431 Abnormal electrocardiogram [ECG] [EKG]: Secondary | ICD-10-CM | POA: Diagnosis not present

## 2016-09-06 DIAGNOSIS — M79606 Pain in leg, unspecified: Secondary | ICD-10-CM | POA: Diagnosis not present

## 2016-09-06 DIAGNOSIS — R0989 Other specified symptoms and signs involving the circulatory and respiratory systems: Secondary | ICD-10-CM | POA: Diagnosis not present

## 2016-09-12 DIAGNOSIS — I351 Nonrheumatic aortic (valve) insufficiency: Secondary | ICD-10-CM | POA: Diagnosis not present

## 2016-09-12 DIAGNOSIS — I440 Atrioventricular block, first degree: Secondary | ICD-10-CM | POA: Diagnosis not present

## 2016-09-12 DIAGNOSIS — R9431 Abnormal electrocardiogram [ECG] [EKG]: Secondary | ICD-10-CM | POA: Diagnosis not present

## 2016-09-12 DIAGNOSIS — I340 Nonrheumatic mitral (valve) insufficiency: Secondary | ICD-10-CM | POA: Diagnosis not present

## 2016-09-15 DIAGNOSIS — Z08 Encounter for follow-up examination after completed treatment for malignant neoplasm: Secondary | ICD-10-CM | POA: Diagnosis not present

## 2016-09-15 DIAGNOSIS — Z85828 Personal history of other malignant neoplasm of skin: Secondary | ICD-10-CM | POA: Diagnosis not present

## 2016-11-23 DIAGNOSIS — E7800 Pure hypercholesterolemia, unspecified: Secondary | ICD-10-CM | POA: Diagnosis not present

## 2016-11-25 DIAGNOSIS — E7800 Pure hypercholesterolemia, unspecified: Secondary | ICD-10-CM | POA: Diagnosis not present

## 2016-11-25 DIAGNOSIS — Z7901 Long term (current) use of anticoagulants: Secondary | ICD-10-CM | POA: Diagnosis not present

## 2016-11-29 DIAGNOSIS — Z85828 Personal history of other malignant neoplasm of skin: Secondary | ICD-10-CM | POA: Diagnosis not present

## 2016-11-29 DIAGNOSIS — B079 Viral wart, unspecified: Secondary | ICD-10-CM | POA: Diagnosis not present

## 2016-11-29 DIAGNOSIS — B078 Other viral warts: Secondary | ICD-10-CM | POA: Diagnosis not present

## 2016-11-29 DIAGNOSIS — Z08 Encounter for follow-up examination after completed treatment for malignant neoplasm: Secondary | ICD-10-CM | POA: Diagnosis not present

## 2017-07-11 DIAGNOSIS — Z08 Encounter for follow-up examination after completed treatment for malignant neoplasm: Secondary | ICD-10-CM | POA: Diagnosis not present

## 2017-07-11 DIAGNOSIS — L821 Other seborrheic keratosis: Secondary | ICD-10-CM | POA: Diagnosis not present

## 2017-07-11 DIAGNOSIS — B078 Other viral warts: Secondary | ICD-10-CM | POA: Diagnosis not present

## 2017-07-11 DIAGNOSIS — L570 Actinic keratosis: Secondary | ICD-10-CM | POA: Diagnosis not present

## 2017-07-11 DIAGNOSIS — Z85828 Personal history of other malignant neoplasm of skin: Secondary | ICD-10-CM | POA: Diagnosis not present

## 2017-08-04 DIAGNOSIS — N62 Hypertrophy of breast: Secondary | ICD-10-CM | POA: Diagnosis not present

## 2017-08-04 DIAGNOSIS — I10 Essential (primary) hypertension: Secondary | ICD-10-CM | POA: Diagnosis not present

## 2017-08-04 DIAGNOSIS — E782 Mixed hyperlipidemia: Secondary | ICD-10-CM | POA: Diagnosis not present

## 2017-08-04 DIAGNOSIS — M8588 Other specified disorders of bone density and structure, other site: Secondary | ICD-10-CM | POA: Diagnosis not present

## 2017-08-09 DIAGNOSIS — M40203 Unspecified kyphosis, cervicothoracic region: Secondary | ICD-10-CM | POA: Diagnosis not present

## 2017-08-09 DIAGNOSIS — I440 Atrioventricular block, first degree: Secondary | ICD-10-CM | POA: Diagnosis not present

## 2017-08-09 DIAGNOSIS — M8589 Other specified disorders of bone density and structure, multiple sites: Secondary | ICD-10-CM | POA: Diagnosis not present

## 2017-08-09 DIAGNOSIS — H35039 Hypertensive retinopathy, unspecified eye: Secondary | ICD-10-CM | POA: Diagnosis not present

## 2017-08-09 DIAGNOSIS — Z1212 Encounter for screening for malignant neoplasm of rectum: Secondary | ICD-10-CM | POA: Diagnosis not present

## 2017-08-09 DIAGNOSIS — I10 Essential (primary) hypertension: Secondary | ICD-10-CM | POA: Diagnosis not present

## 2017-08-09 DIAGNOSIS — K219 Gastro-esophageal reflux disease without esophagitis: Secondary | ICD-10-CM | POA: Diagnosis not present

## 2017-08-09 DIAGNOSIS — K5730 Diverticulosis of large intestine without perforation or abscess without bleeding: Secondary | ICD-10-CM | POA: Diagnosis not present

## 2017-08-09 DIAGNOSIS — M8588 Other specified disorders of bone density and structure, other site: Secondary | ICD-10-CM | POA: Diagnosis not present

## 2017-08-09 DIAGNOSIS — M40204 Unspecified kyphosis, thoracic region: Secondary | ICD-10-CM | POA: Diagnosis not present

## 2017-08-09 DIAGNOSIS — Z0001 Encounter for general adult medical examination with abnormal findings: Secondary | ICD-10-CM | POA: Diagnosis not present

## 2017-11-14 DIAGNOSIS — M8588 Other specified disorders of bone density and structure, other site: Secondary | ICD-10-CM | POA: Diagnosis not present

## 2017-11-14 DIAGNOSIS — E291 Testicular hypofunction: Secondary | ICD-10-CM | POA: Diagnosis not present

## 2018-01-16 DIAGNOSIS — E291 Testicular hypofunction: Secondary | ICD-10-CM | POA: Diagnosis not present

## 2018-01-16 DIAGNOSIS — M8580 Other specified disorders of bone density and structure, unspecified site: Secondary | ICD-10-CM | POA: Diagnosis not present

## 2018-08-14 DIAGNOSIS — I10 Essential (primary) hypertension: Secondary | ICD-10-CM | POA: Diagnosis not present

## 2018-08-14 DIAGNOSIS — E7800 Pure hypercholesterolemia, unspecified: Secondary | ICD-10-CM | POA: Diagnosis not present

## 2018-08-17 DIAGNOSIS — I10 Essential (primary) hypertension: Secondary | ICD-10-CM | POA: Diagnosis not present

## 2018-08-17 DIAGNOSIS — Z23 Encounter for immunization: Secondary | ICD-10-CM | POA: Diagnosis not present

## 2018-08-17 DIAGNOSIS — M8588 Other specified disorders of bone density and structure, other site: Secondary | ICD-10-CM | POA: Diagnosis not present

## 2018-08-17 DIAGNOSIS — Z0000 Encounter for general adult medical examination without abnormal findings: Secondary | ICD-10-CM | POA: Diagnosis not present

## 2018-08-17 DIAGNOSIS — Z1212 Encounter for screening for malignant neoplasm of rectum: Secondary | ICD-10-CM | POA: Diagnosis not present

## 2019-07-05 ENCOUNTER — Emergency Department (HOSPITAL_COMMUNITY): Payer: Medicare Other

## 2019-07-05 ENCOUNTER — Encounter (HOSPITAL_COMMUNITY): Payer: Self-pay | Admitting: Emergency Medicine

## 2019-07-05 ENCOUNTER — Emergency Department (HOSPITAL_COMMUNITY)
Admission: EM | Admit: 2019-07-05 | Discharge: 2019-07-05 | Disposition: A | Discharge status: Home / Self Care | Payer: Medicare Other | Attending: Emergency Medicine | Admitting: Emergency Medicine

## 2019-07-05 ENCOUNTER — Other Ambulatory Visit: Payer: Self-pay

## 2019-07-05 DIAGNOSIS — Y9389 Activity, other specified: Secondary | ICD-10-CM | POA: Insufficient documentation

## 2019-07-05 DIAGNOSIS — Z79899 Other long term (current) drug therapy: Secondary | ICD-10-CM | POA: Insufficient documentation

## 2019-07-05 DIAGNOSIS — Y999 Unspecified external cause status: Secondary | ICD-10-CM | POA: Insufficient documentation

## 2019-07-05 DIAGNOSIS — R110 Nausea: Secondary | ICD-10-CM | POA: Diagnosis not present

## 2019-07-05 DIAGNOSIS — W010XXA Fall on same level from slipping, tripping and stumbling without subsequent striking against object, initial encounter: Secondary | ICD-10-CM | POA: Insufficient documentation

## 2019-07-05 DIAGNOSIS — S59901A Unspecified injury of right elbow, initial encounter: Secondary | ICD-10-CM | POA: Diagnosis present

## 2019-07-05 DIAGNOSIS — Z7901 Long term (current) use of anticoagulants: Secondary | ICD-10-CM | POA: Insufficient documentation

## 2019-07-05 DIAGNOSIS — Y929 Unspecified place or not applicable: Secondary | ICD-10-CM | POA: Diagnosis not present

## 2019-07-05 DIAGNOSIS — S53104A Unspecified dislocation of right ulnohumeral joint, initial encounter: Secondary | ICD-10-CM

## 2019-07-05 DIAGNOSIS — Z743 Need for continuous supervision: Secondary | ICD-10-CM | POA: Diagnosis not present

## 2019-07-05 DIAGNOSIS — I10 Essential (primary) hypertension: Secondary | ICD-10-CM | POA: Insufficient documentation

## 2019-07-05 DIAGNOSIS — Z87891 Personal history of nicotine dependence: Secondary | ICD-10-CM | POA: Insufficient documentation

## 2019-07-05 DIAGNOSIS — Z23 Encounter for immunization: Secondary | ICD-10-CM | POA: Diagnosis not present

## 2019-07-05 DIAGNOSIS — S80211A Abrasion, right knee, initial encounter: Secondary | ICD-10-CM | POA: Diagnosis not present

## 2019-07-05 DIAGNOSIS — W19XXXA Unspecified fall, initial encounter: Secondary | ICD-10-CM

## 2019-07-05 DIAGNOSIS — S53101A Unspecified subluxation of right ulnohumeral joint, initial encounter: Secondary | ICD-10-CM | POA: Diagnosis not present

## 2019-07-05 DIAGNOSIS — S53024A Posterior dislocation of right radial head, initial encounter: Secondary | ICD-10-CM | POA: Diagnosis not present

## 2019-07-05 DIAGNOSIS — M79603 Pain in arm, unspecified: Secondary | ICD-10-CM | POA: Diagnosis not present

## 2019-07-05 DIAGNOSIS — R0902 Hypoxemia: Secondary | ICD-10-CM | POA: Diagnosis not present

## 2019-07-05 LAB — CBC
HCT: 44 % (ref 39.0–52.0)
Hemoglobin: 14.7 g/dL (ref 13.0–17.0)
MCH: 31.7 pg (ref 26.0–34.0)
MCHC: 33.4 g/dL (ref 30.0–36.0)
MCV: 95 fL (ref 80.0–100.0)
Platelets: 158 10*3/uL (ref 150–400)
RBC: 4.63 MIL/uL (ref 4.22–5.81)
RDW: 12.8 % (ref 11.5–15.5)
WBC: 12.2 10*3/uL — ABNORMAL HIGH (ref 4.0–10.5)
nRBC: 0 % (ref 0.0–0.2)

## 2019-07-05 LAB — BASIC METABOLIC PANEL
Anion gap: 9 (ref 5–15)
BUN: 19 mg/dL (ref 8–23)
CO2: 26 mmol/L (ref 22–32)
Calcium: 9.1 mg/dL (ref 8.9–10.3)
Chloride: 103 mmol/L (ref 98–111)
Creatinine, Ser: 1.17 mg/dL (ref 0.61–1.24)
GFR calc Af Amer: >60 mL/min (ref >60)
GFR calc non Af Amer: 57 mL/min — ABNORMAL LOW (ref >60)
Glucose, Bld: 116 mg/dL — ABNORMAL HIGH (ref 70–99)
Potassium: 4 mmol/L (ref 3.5–5.1)
Sodium: 138 mmol/L (ref 135–145)

## 2019-07-05 MED ORDER — OXYCODONE-ACETAMINOPHEN 5-325 MG PO TABS: 0.5000 | ORAL_TABLET | ORAL | 0 refills | Status: DC | PRN

## 2019-07-05 MED ORDER — TETANUS-DIPHTHERIA TOXOIDS TD 5-2 LFU IM INJ
0.5000 mL | INJECTION | Freq: Once | INTRAMUSCULAR | Status: DC
  Filled 2019-07-05: qty 0.5

## 2019-07-05 MED ORDER — TETANUS-DIPHTH-ACELL PERTUSSIS 5-2.5-18.5 LF-MCG/0.5 IM SUSP
0.5000 mL | Freq: Once | INTRAMUSCULAR | Status: AC
  Administered 2019-07-05: 11:00:00 0.5 mL via INTRAMUSCULAR
  Filled 2019-07-05: qty 0.5

## 2019-07-05 MED ORDER — FENTANYL CITRATE (PF) 100 MCG/2ML IJ SOLN
50.0000 ug | Freq: Once | INTRAMUSCULAR | Status: AC
  Administered 2019-07-05: 50 ug via INTRAVENOUS
  Filled 2019-07-05: qty 2

## 2019-07-05 NOTE — Discharge Instructions (Addendum)
Follow up with Dr. Amedeo Plenty at [facility] Wednesday February3,2021, call office if this does not work for you Return to the ED if you have worsening pain in your arm, hand, or fingers. Keep arm elevated when sitting and use sling.

## 2019-07-05 NOTE — ED Notes (Signed)
EDP at bedside, reducing and manipulating patient's right arm to get elbow back in place.  Ortho tech notified to come.

## 2019-07-05 NOTE — ED Notes (Signed)
Pt gave permission to give his wife information on the phone.

## 2019-07-05 NOTE — ED Notes (Signed)
X-ray at bedside

## 2019-07-05 NOTE — ED Provider Notes (Signed)
[facility] DEPT Provider Note   CSN: [id number] Arrival date & time: 07/05/19  0915     History Chief Complaint  Patient presents with  . Arm Injury    Peter Gallegos is a 86 y.o. male.  HPI 86 year old male on [employer] presents today after mechanical fall with right elbow injury.  He is trying to get his [place] into the car.  Dog was scared and pulled away from him.  He fell onto his right side.  He did not strike his head and had no loss of consciousness.  His wife called EMS.  They noted deformity of the right elbow.  They state there are positive pulses with plate in place.  He received fentanyl in route and a splint placed.  They did not note any other injuries.  There is no loss of consciousness.    Past Medical History:  Diagnosis Date  . Anxiety and depression   . Depression   . Diverticulosis   . Family history of coronary artery disease   . GERD (gastroesophageal reflux disease)   . History of arm fracture    Rt arm, LT elbow fracture  . History of colon polyps   . History of ETOH abuse    Quit in 1980  . Hypercholesterolemia   . Hypertension   . PONV (postoperative nausea and vomiting)   . TIA (transient ischemic attack) 12/2005    Patient Active Problem List   Diagnosis Date Noted  . H/O adenomatous polyp of colon 02/18/2013  . Hx of transient ischemic attack (TIA) 02/18/2013  . Depression 02/18/2013  . Other and unspecified hyperlipidemia 02/18/2013    Past Surgical History:  Procedure Laterality Date  . COLONOSCOPY    . OPEN REDUCTION INTERNAL FIXATION (ORIF) DISTAL PHALANX Left 11/21/2013   Procedure: OPEN REDUCTION INTERNAL FIXATION (ORIF) DISTAL PHALANX;  Surgeon: Linna Hoff, MD;  Location: [facility];  Service: Orthopedics;  Laterality: Left;       Family History  Problem Relation Age of Onset  . Heart disease Father        Deceased 33 ( Had MI )  . Heart disease Mother     Social History   Tobacco  Use  . Smoking status: Former Smoker    Types: Pipe  . Tobacco comment: Quit smoking pipe years ago  Substance Use Topics  . Alcohol use: No  . Drug use: No    Home Medications Prior to Admission medications   Medication Sig Start Date End Date Taking? Authorizing Provider  amoxicillin-clavulanate (AUGMENTIN) 875-125 MG per tablet Take 1 tablet by mouth 2 (two) times daily. One po bid x 7 days 11/20/13   Pisciotta, Elmyra Ricks, PA-C  buPROPion (WELLBUTRIN XL) 300 MG 24 hr tablet Take 300 mg by mouth daily.    [provider]  dipyridamole-aspirin (AGGRENOX) 200-25 MG per 12 hr capsule Take 1 capsule by mouth 2 (two) times daily.    [provider]  docusate sodium (COLACE) 100 MG capsule Take 1 capsule (100 mg total) by mouth 2 (two) times daily. 11/21/13   Iran Planas, MD  HYDROcodone-acetaminophen (NORCO/VICODIN) 5-325 MG per tablet Take 1-2 tablets by mouth every 6 hours as needed for pain. 11/20/13   Pisciotta, Elmyra Ricks, PA-C  Hydrocodone-Acetaminophen (VICODIN) 5-300 MG TABS Take 1 tablet by mouth 4 (four) times daily as needed (PAIN). 11/21/13   Iran Planas, MD  Multiple Vitamin (MULTIVITAMIN) capsule Take 1 capsule by mouth daily.    [provider]  Omega-3 Fatty Acids (FISH OIL) 500 MG CAPS Take 1 capsule by mouth 2 (two) times daily before a meal.    [provider]  sertraline (ZOLOFT) 50 MG tablet Take 50 mg by mouth daily.    [provider]  Sildenafil Citrate (VIAGRA PO) Take 1 tablet by mouth daily as needed (for erectile dysfunction).     [provider]  simvastatin (ZOCOR) 80 MG tablet Take 80 mg by mouth at bedtime. Take 1/2 tab once a day    [provider]  triamterene-hydrochlorothiazide (MAXZIDE-25) 37.5-25 MG per tablet Take 1 tablet by mouth daily.    [provider]    Allergies    Patient has no known allergies.  Review of Systems   Review of Systems  All other systems reviewed and are  negative.   Physical Exam Updated Vital Signs There were no vitals taken for this visit.  Physical Exam Vitals and nursing note reviewed.  Constitutional:      Appearance: Normal appearance.  HENT:     Head: Normocephalic.     Right Ear: External ear normal.     Left Ear: External ear normal.     Nose: Nose normal.     Mouth/Throat:     Mouth: Mucous membranes are moist.  Eyes:     Pupils: Pupils are equal, round, and reactive to light.  Cardiovascular:     Rate and Rhythm: Normal rate.  Pulmonary:     Effort: Pulmonary effort is normal.  Abdominal:     General: Abdomen is flat.  Musculoskeletal:     Cervical back: Normal range of motion.     Comments: Deformity to right elbow with probable dislocation Abrasion noted Radial pulses intact No motor deficit noted Abrasion of right knee No tenderness palpation of right knee No tenderness palpation of her bilateral hips.  Pelvis appears stable  Skin:    General: Skin is warm and dry.     Capillary Refill: Capillary refill takes less than 2 seconds.  Neurological:     General: No focal deficit present.     Mental Status: He is alert.  Psychiatric:        Mood and Affect: Mood normal.     ED Results / Procedures / Treatments   Labs (all labs ordered are listed, but only abnormal results are displayed) Labs Reviewed - No data to display  EKG None  Radiology No results found.  Procedures .Ortho Injury Treatment  Date/Time: 07/05/2019 10:58 AM Performed by: Pattricia Boss, MD Authorized by: Pattricia Boss, MD   Consent:    Consent obtained:  Verbal   Consent given by:  Patient   Risks discussed:  Fracture, nerve damage and vascular damage   Alternatives discussed:  Delayed treatmentInjury location: elbow Location details: right elbow Injury type: dislocation Dislocation type: posterior Pre-procedure neurovascular assessment: neurovascularly intact Pre-procedure distal perfusion: normal Pre-procedure  neurological function: normal Pre-procedure range of motion: reduced  Anesthesia: Local anesthesia used: no Immobilization: splint Splint type: volar short arm Supplies used: cotton padding and Ortho-Glass Post-procedure neurovascular assessment: post-procedure neurovascularly intact Post-procedure distal perfusion: normal Post-procedure neurological function: normal Post-procedure range of motion: normal    (including critical care time)  Medications Ordered in ED Medications - No data to display  ED Course  I have reviewed the triage vital signs and the nursing notes.  Pertinent labs & imaging results that were available during my care of the patient were reviewed by me and considered in  my medical decision making (see chart for details). Discussed with Hilbert Odor, PA, and he also discussed with Dr. Amedeo Plenty.  I have reviewed x-rays and feel reduction is adequate.  Patient is to follow-up with Dr. Amedeo Plenty Wednesday morning at 8 AM or he can call at that time is open. I discussed return precautions and need for follow-up patient voices understanding.   MDM Rules/Calculators/A&P final Clinical Impression(s) / ED Diagnoses Final diagnoses:  Fall, initial encounter  Dislocation of right elbow, initial encounter  Abrasion, knee, right, initial encounter    Rx / DC Orders ED Discharge Orders    None       Pattricia Boss, MD 07/05/19 1232

## 2019-07-05 NOTE — ED Triage Notes (Signed)
Per [facility] pt was trying to take his dog to the vet when dog pulled and drug patient on the driveway. Patient has deformity to right arm.  20g in left hand. Fentanyl 140mcg given in route.

## 2019-07-05 NOTE — ED Notes (Signed)
Called wife and informed him that he is ready for discharge

## 2019-07-10 DIAGNOSIS — M25521 Pain in right elbow: Secondary | ICD-10-CM | POA: Diagnosis not present

## 2019-07-10 DIAGNOSIS — S53106A Unspecified dislocation of unspecified ulnohumeral joint, initial encounter: Secondary | ICD-10-CM | POA: Diagnosis not present

## 2019-07-22 DIAGNOSIS — M25629 Stiffness of unspecified elbow, not elsewhere classified: Secondary | ICD-10-CM | POA: Diagnosis not present

## 2019-07-22 DIAGNOSIS — M25521 Pain in right elbow: Secondary | ICD-10-CM | POA: Diagnosis not present

## 2019-07-22 DIAGNOSIS — S53104D Unspecified dislocation of right ulnohumeral joint, subsequent encounter: Secondary | ICD-10-CM | POA: Diagnosis not present

## 2019-07-30 DIAGNOSIS — M25629 Stiffness of unspecified elbow, not elsewhere classified: Secondary | ICD-10-CM | POA: Diagnosis not present

## 2019-08-06 DIAGNOSIS — M25629 Stiffness of unspecified elbow, not elsewhere classified: Secondary | ICD-10-CM | POA: Diagnosis not present

## 2019-08-07 DIAGNOSIS — S53104D Unspecified dislocation of right ulnohumeral joint, subsequent encounter: Secondary | ICD-10-CM | POA: Diagnosis not present

## 2019-08-07 DIAGNOSIS — M13849 Other specified arthritis, unspecified hand: Secondary | ICD-10-CM | POA: Diagnosis not present

## 2019-08-07 DIAGNOSIS — M25521 Pain in right elbow: Secondary | ICD-10-CM | POA: Diagnosis not present

## 2019-08-13 DIAGNOSIS — M25629 Stiffness of unspecified elbow, not elsewhere classified: Secondary | ICD-10-CM | POA: Diagnosis not present

## 2019-08-20 DIAGNOSIS — I10 Essential (primary) hypertension: Secondary | ICD-10-CM | POA: Diagnosis not present

## 2019-08-20 DIAGNOSIS — E7800 Pure hypercholesterolemia, unspecified: Secondary | ICD-10-CM | POA: Diagnosis not present

## 2019-08-23 DIAGNOSIS — I10 Essential (primary) hypertension: Secondary | ICD-10-CM | POA: Diagnosis not present

## 2019-08-23 DIAGNOSIS — Z0001 Encounter for general adult medical examination with abnormal findings: Secondary | ICD-10-CM | POA: Diagnosis not present

## 2019-09-05 DIAGNOSIS — S53104D Unspecified dislocation of right ulnohumeral joint, subsequent encounter: Secondary | ICD-10-CM | POA: Diagnosis not present

## 2019-09-05 DIAGNOSIS — M25521 Pain in right elbow: Secondary | ICD-10-CM | POA: Diagnosis not present

## 2019-09-06 DIAGNOSIS — M25629 Stiffness of unspecified elbow, not elsewhere classified: Secondary | ICD-10-CM | POA: Diagnosis not present

## 2019-09-13 DIAGNOSIS — M25629 Stiffness of unspecified elbow, not elsewhere classified: Secondary | ICD-10-CM | POA: Diagnosis not present

## 2019-09-18 DIAGNOSIS — E785 Hyperlipidemia, unspecified: Secondary | ICD-10-CM | POA: Diagnosis not present

## 2019-09-18 DIAGNOSIS — M810 Age-related osteoporosis without current pathological fracture: Secondary | ICD-10-CM | POA: Diagnosis not present

## 2019-09-18 DIAGNOSIS — M8589 Other specified disorders of bone density and structure, multiple sites: Secondary | ICD-10-CM | POA: Diagnosis not present

## 2019-09-19 DIAGNOSIS — M25629 Stiffness of unspecified elbow, not elsewhere classified: Secondary | ICD-10-CM | POA: Diagnosis not present

## 2019-09-27 DIAGNOSIS — M25629 Stiffness of unspecified elbow, not elsewhere classified: Secondary | ICD-10-CM | POA: Diagnosis not present

## 2019-10-02 ENCOUNTER — Ambulatory Visit: Payer: Self-pay | Admitting: Podiatry

## 2019-10-11 DIAGNOSIS — M25629 Stiffness of unspecified elbow, not elsewhere classified: Secondary | ICD-10-CM | POA: Diagnosis not present

## 2019-10-18 DIAGNOSIS — M25629 Stiffness of unspecified elbow, not elsewhere classified: Secondary | ICD-10-CM | POA: Diagnosis not present

## 2019-10-24 DIAGNOSIS — M25629 Stiffness of unspecified elbow, not elsewhere classified: Secondary | ICD-10-CM | POA: Diagnosis not present

## 2019-10-30 ENCOUNTER — Other Ambulatory Visit: Payer: Self-pay

## 2019-10-30 ENCOUNTER — Ambulatory Visit: Payer: Medicare Other | Admitting: Podiatry

## 2019-10-30 DIAGNOSIS — M79674 Pain in right toe(s): Secondary | ICD-10-CM

## 2019-10-30 DIAGNOSIS — B351 Tinea unguium: Secondary | ICD-10-CM

## 2019-10-30 DIAGNOSIS — M79675 Pain in left toe(s): Secondary | ICD-10-CM

## 2019-11-01 DIAGNOSIS — M25629 Stiffness of unspecified elbow, not elsewhere classified: Secondary | ICD-10-CM | POA: Diagnosis not present

## 2019-11-06 ENCOUNTER — Encounter: Payer: Self-pay | Admitting: Podiatry

## 2019-11-06 NOTE — Progress Notes (Signed)
  Subjective:  Patient ID: Peter Gallegos, male    DOB: 20-Mar-1933,  MRN: [id number]  Chief Complaint  Patient presents with  . Nail Problem    pt is here for bil toenail fungus, and is also looking to get his nails looked at.   86 y.o. male returns for the above complaint.  Patient presents with thickened elongated dystrophic rams horn/onychomycosis bilaterally.  They are painful to palpation.  Patient states that they are very painful to ambulate on.  He states that pain during ambulation is due to shoes constantly rubbing up on them.  He has not been able to debride himself.  He would like to have me debride them down.  He denies any other acute complaints.  Objective:  There were no vitals filed for this visit. Podiatric Exam: Vascular: dorsalis pedis and posterior tibial pulses are palpable bilateral. Capillary return is immediate. Temperature gradient is WNL. Skin turgor WNL  Sensorium: Normal Semmes Weinstein monofilament test. Normal tactile sensation bilaterally. Nail Exam: Pt has thick disfigured discolored nails with subungual debris noted bilateral entire nail hallux through fifth toenails.  Pain on palpation to the nails. Ulcer Exam: There is no evidence of ulcer or pre-ulcerative changes or infection. Orthopedic Exam: Muscle tone and strength are WNL. No limitations in general ROM. No crepitus or effusions noted. HAV  B/L.  Hammer toes 2-5  B/L. Skin: No Porokeratosis. No infection or ulcers    Assessment & Plan:  No diagnosis found.  Patient was evaluated and treated and all questions answered.  Onychomycosis with pain  -Nails palliatively debrided as below. -Educated on self-care  Procedure: Nail Debridement Rationale: pain  Type of Debridement: manual, sharp debridement. Instrumentation: Nail nipper, rotary burr. Number of Nails: 10  Procedures and Treatment: Consent by patient was obtained for treatment procedures. The patient understood the discussion of  treatment and procedures well. All questions were answered thoroughly reviewed. Debridement of mycotic and hypertrophic toenails, 1 through 5 bilateral and clearing of subungual debris. No ulceration, no infection noted.  Return Visit-Office Procedure: Patient instructed to return to the office for a follow up visit 3 months for continued evaluation and treatment.  [name], DPM    No follow-ups on file.

## 2020-02-05 ENCOUNTER — Ambulatory Visit: Payer: Medicare Other | Admitting: Podiatry

## 2020-02-19 ENCOUNTER — Ambulatory Visit: Payer: Medicare Other | Admitting: Podiatry

## 2020-02-19 ENCOUNTER — Other Ambulatory Visit: Payer: Self-pay

## 2020-02-19 DIAGNOSIS — L853 Xerosis cutis: Secondary | ICD-10-CM | POA: Diagnosis not present

## 2020-02-19 DIAGNOSIS — M79674 Pain in right toe(s): Secondary | ICD-10-CM

## 2020-02-19 DIAGNOSIS — M79675 Pain in left toe(s): Secondary | ICD-10-CM | POA: Diagnosis not present

## 2020-02-19 DIAGNOSIS — B351 Tinea unguium: Secondary | ICD-10-CM | POA: Diagnosis not present

## 2020-02-21 ENCOUNTER — Encounter: Payer: Self-pay | Admitting: Podiatry

## 2020-02-21 NOTE — Progress Notes (Signed)
  Subjective:  Patient ID: Peter Gallegos, male    DOB: January 17, 1933,  MRN: [id number]  Chief Complaint  Patient presents with  . routine foot care    nail trim    86 y.o. male returns for the above complaint.  Patient presents with thickened elongated dystrophic rams horn/onychomycosis bilaterally.  They are painful to palpation.  Patient states that they are very painful to ambulate on.  He states that pain during ambulation is due to shoes constantly rubbing up on them.  He has not been able to debride himself.  He would like to have me debride them down.  He has secondary complaint of dry skin and would like to discuss treatment options.  He has not done anything for it.  He does not moisturize.  He denies any other acute complaints.  Objective:  There were no vitals filed for this visit. Podiatric Exam: Vascular: dorsalis pedis and posterior tibial pulses are palpable bilateral. Capillary return is immediate. Temperature gradient is WNL. Skin turgor WNL  Sensorium: Normal Semmes Weinstein monofilament test. Normal tactile sensation bilaterally. Nail Exam: Pt has thick disfigured discolored nails with subungual debris noted bilateral entire nail hallux through fifth toenails.  Pain on palpation to the nails. Ulcer Exam: There is no evidence of ulcer or pre-ulcerative changes or infection. Orthopedic Exam: Muscle tone and strength are WNL. No limitations in general ROM. No crepitus or effusions noted. HAV  B/L.  Hammer toes 2-5  B/L. Skin: No Porokeratosis. No infection or ulcers.  Dry skin without fissure or pain to the bilateral lower extremity    Assessment & Plan:   1. Pain due to onychomycosis of toenails of both feet   2. Xerosis cutis     Patient was evaluated and treated and all questions answered.  Xerosis bilateral lower extremity -I explained to the patient the etiology of xerosis and various treatment options were extensively discussed.  I explained to the patient the  importance of maintaining moisturization of the skin with application of over-the-counter lotion such as Eucerin or Luciderm.  I have asked the patient to apply this twice a day.  If unable to resolve patient will benefit from prescription lotion.   Onychomycosis with pain  -Nails palliatively debrided as below. -Educated on self-care  Procedure: Nail Debridement Rationale: pain  Type of Debridement: manual, sharp debridement. Instrumentation: Nail nipper, rotary burr. Number of Nails: 10  Procedures and Treatment: Consent by patient was obtained for treatment procedures. The patient understood the discussion of treatment and procedures well. All questions were answered thoroughly reviewed. Debridement of mycotic and hypertrophic toenails, 1 through 5 bilateral and clearing of subungual debris. No ulceration, no infection noted.  Return Visit-Office Procedure: Patient instructed to return to the office for a follow up visit 3 months for continued evaluation and treatment.  [name], DPM    No follow-ups on file.

## 2020-05-20 ENCOUNTER — Other Ambulatory Visit: Payer: Self-pay

## 2020-05-20 ENCOUNTER — Ambulatory Visit: Payer: Medicare Other | Admitting: Podiatry

## 2020-05-20 ENCOUNTER — Encounter: Payer: Self-pay | Admitting: Podiatry

## 2020-05-20 DIAGNOSIS — M79674 Pain in right toe(s): Secondary | ICD-10-CM

## 2020-05-20 DIAGNOSIS — B351 Tinea unguium: Secondary | ICD-10-CM

## 2020-05-20 DIAGNOSIS — M79675 Pain in left toe(s): Secondary | ICD-10-CM | POA: Diagnosis not present

## 2020-05-20 DIAGNOSIS — Q828 Other specified congenital malformations of skin: Secondary | ICD-10-CM

## 2020-05-20 NOTE — Progress Notes (Signed)
  Subjective:  Patient ID: Peter Gallegos, Peter Gallegos    DOB: 03-Aug-1932,  MRN: [id number]  Chief Complaint  Patient presents with  . routine foot care    Nail trim    86 y.o. Peter Gallegos returns for the above complaint.  Patient presents with thickened elongated dystrophic rams horn/onychomycosis bilaterally.  They are painful to palpation.  Patient states that they are very painful to ambulate on.  He states that pain during ambulation is due to shoes constantly rubbing up on them.  He has not been able to debride himself.  He would like to have me debride them down.  He also has secondary complaint of bilateral porokeratosis that are painful to walk on.  He would like for me to debride them down.  He denies any other acute complaints.  Objective:  There were no vitals filed for this visit. Podiatric Exam: Vascular: dorsalis pedis and posterior tibial pulses are palpable bilateral. Capillary return is immediate. Temperature gradient is WNL. Skin turgor WNL  Sensorium: Normal Semmes Weinstein monofilament test. Normal tactile sensation bilaterally. Nail Exam: Pt has thick disfigured discolored nails with subungual debris noted bilateral entire nail hallux through fifth toenails.  Pain on palpation to the nails. Ulcer Exam: There is no evidence of ulcer or pre-ulcerative changes or infection. Orthopedic Exam: Muscle tone and strength are WNL. No limitations in general ROM. No crepitus or effusions noted. HAV  B/L.  Hammer toes 2-5  B/L. Skin: Hyperkeratotic lesion noted to bilateral medial aspect of the hallux.  No pinpoint bleeding noted.  Pain on palpation to the lesion. No infection or ulcers.  Dry skin without fissure or pain to the bilateral lower extremity    Assessment & Plan:   1. Pain due to onychomycosis of toenails of both feet   2. Porokeratosis     Patient was evaluated and treated and all questions answered.  Xerosis bilateral lower extremity -I explained to the patient the  etiology of xerosis and various treatment options were extensively discussed.  I explained to the patient the importance of maintaining moisturization of the skin with application of over-the-counter lotion such as Eucerin or Luciderm.  I have asked the patient to apply this twice a day.  If unable to resolve patient will benefit from prescription lotion.  Porokeratosis bilateral hallux x2 -I explained to patient the etiology of porokeratosis versus treatment options were discussed.  Given the amount of pain that is having I believe patient will benefit from aggressive debridement of the lesion.  Using chisel blade and handle the lesions were debrided down to healthy striated tissue.  No complication noted.  No pinpoint bleeding noted  Onychomycosis with pain  -Nails palliatively debrided as below. -Educated on self-care  Procedure: Nail Debridement Rationale: pain  Type of Debridement: manual, sharp debridement. Instrumentation: Nail nipper, rotary burr. Number of Nails: 10  Procedures and Treatment: Consent by patient was obtained for treatment procedures. The patient understood the discussion of treatment and procedures well. All questions were answered thoroughly reviewed. Debridement of mycotic and hypertrophic toenails, 1 through 5 bilateral and clearing of subungual debris. No ulceration, no infection noted.  Return Visit-Office Procedure: Patient instructed to return to the office for a follow up visit 3 months for continued evaluation and treatment.  [name], DPM    No follow-ups on file.

## 2020-08-19 ENCOUNTER — Ambulatory Visit: Payer: Medicare Other | Admitting: Podiatry

## 2020-08-19 ENCOUNTER — Other Ambulatory Visit: Payer: Self-pay

## 2020-08-19 ENCOUNTER — Encounter: Payer: Self-pay | Admitting: Podiatry

## 2020-08-19 DIAGNOSIS — M79675 Pain in left toe(s): Secondary | ICD-10-CM | POA: Diagnosis not present

## 2020-08-19 DIAGNOSIS — M79674 Pain in right toe(s): Secondary | ICD-10-CM | POA: Diagnosis not present

## 2020-08-19 DIAGNOSIS — B351 Tinea unguium: Secondary | ICD-10-CM

## 2020-08-25 ENCOUNTER — Encounter: Payer: Self-pay | Admitting: Podiatry

## 2020-08-25 DIAGNOSIS — I10 Essential (primary) hypertension: Secondary | ICD-10-CM | POA: Diagnosis not present

## 2020-08-25 DIAGNOSIS — E7800 Pure hypercholesterolemia, unspecified: Secondary | ICD-10-CM | POA: Diagnosis not present

## 2020-08-25 NOTE — Progress Notes (Signed)
  Subjective:  Patient ID: Peter Gallegos, male    DOB: [birth year],  MRN: [id number]  Chief Complaint  Patient presents with  . Nail Problem    Nail trim   87 y.o. male returns for the above complaint.  Patient presents with thickened elongated dystrophic onychomycosis bilaterally x10.  They are painful to palpation.  Patient states that they are very hard to and painful to ambulate on.  Patient would like to have the repair done.  He has no other acute concerns.  Objective:  There were no vitals filed for this visit. Podiatric Exam: Vascular: dorsalis pedis and posterior tibial pulses are palpable bilateral. Capillary return is immediate. Temperature gradient is WNL. Skin turgor WNL  Sensorium: Normal Semmes Weinstein monofilament test. Normal tactile sensation bilaterally. Nail Exam: Pt has thick disfigured discolored nails with subungual debris noted bilateral entire nail hallux through fifth toenails.  Pain on palpation to the nails. Ulcer Exam: There is no evidence of ulcer or pre-ulcerative changes or infection. Orthopedic Exam: Muscle tone and strength are WNL. No limitations in general ROM. No crepitus or effusions noted. HAV  B/L.  Hammer toes 2-5  B/L. Skin: Hyperkeratotic lesion noted to bilateral medial aspect of the hallux.  No pinpoint bleeding noted.  Pain on palpation to the lesion. No infection or ulcers.  Dry skin without fissure or pain to the bilateral lower extremity    Assessment & Plan:   1. Pain due to onychomycosis of toenails of both feet     Patient was evaluated and treated and all questions answered.  Xerosis bilateral lower extremity -I explained to the patient the etiology of xerosis and various treatment options were extensively discussed.  I explained to the patient the importance of maintaining moisturization of the skin with application of over-the-counter lotion such as Eucerin or Luciderm.  I have asked the patient to apply this twice a day.  If  unable to resolve patient will benefit from prescription lotion.  Porokeratosis bilateral hallux x2 -I explained to patient the etiology of porokeratosis versus treatment options were discussed.  Given the amount of pain that is having I believe patient will benefit from aggressive debridement of the lesion.  Using chisel blade and handle the lesions were debrided down to healthy striated tissue.  No complication noted.  No pinpoint bleeding noted  Onychomycosis with pain  -Nails palliatively debrided as below. -Educated on self-care  Procedure: Nail Debridement Rationale: pain  Type of Debridement: manual, sharp debridement. Instrumentation: Nail nipper, rotary burr. Number of Nails: 10  Procedures and Treatment: Consent by patient was obtained for treatment procedures. The patient understood the discussion of treatment and procedures well. All questions were answered thoroughly reviewed. Debridement of mycotic and hypertrophic toenails, 1 through 5 bilateral and clearing of subungual debris. No ulceration, no infection noted.  Return Visit-Office Procedure: Patient instructed to return to the office for a follow up visit 3 months for continued evaluation and treatment.  [name], DPM    Return in about 3 months (around 11/19/2020) for DR. Prudence Davidson .

## 2020-08-31 DIAGNOSIS — L578 Other skin changes due to chronic exposure to nonionizing radiation: Secondary | ICD-10-CM | POA: Diagnosis not present

## 2020-08-31 DIAGNOSIS — N1831 Chronic kidney disease, stage 3a: Secondary | ICD-10-CM | POA: Diagnosis not present

## 2020-08-31 DIAGNOSIS — E782 Mixed hyperlipidemia: Secondary | ICD-10-CM | POA: Diagnosis not present

## 2020-08-31 DIAGNOSIS — I10 Essential (primary) hypertension: Secondary | ICD-10-CM | POA: Diagnosis not present

## 2020-08-31 DIAGNOSIS — Z0001 Encounter for general adult medical examination with abnormal findings: Secondary | ICD-10-CM | POA: Diagnosis not present

## 2020-08-31 DIAGNOSIS — R600 Localized edema: Secondary | ICD-10-CM | POA: Diagnosis not present

## 2020-08-31 DIAGNOSIS — I440 Atrioventricular block, first degree: Secondary | ICD-10-CM | POA: Diagnosis not present

## 2020-08-31 DIAGNOSIS — H35039 Hypertensive retinopathy, unspecified eye: Secondary | ICD-10-CM | POA: Diagnosis not present

## 2020-08-31 DIAGNOSIS — K219 Gastro-esophageal reflux disease without esophagitis: Secondary | ICD-10-CM | POA: Diagnosis not present

## 2020-08-31 DIAGNOSIS — E876 Hypokalemia: Secondary | ICD-10-CM | POA: Diagnosis not present

## 2020-09-15 DIAGNOSIS — L570 Actinic keratosis: Secondary | ICD-10-CM | POA: Diagnosis not present

## 2020-09-15 DIAGNOSIS — X32XXXD Exposure to sunlight, subsequent encounter: Secondary | ICD-10-CM | POA: Diagnosis not present

## 2020-09-15 DIAGNOSIS — C4442 Squamous cell carcinoma of skin of scalp and neck: Secondary | ICD-10-CM | POA: Diagnosis not present

## 2020-09-25 DIAGNOSIS — I10 Essential (primary) hypertension: Secondary | ICD-10-CM | POA: Diagnosis not present

## 2020-11-18 ENCOUNTER — Other Ambulatory Visit: Payer: Self-pay

## 2020-11-18 ENCOUNTER — Encounter: Payer: Self-pay | Admitting: Podiatry

## 2020-11-18 ENCOUNTER — Ambulatory Visit: Payer: Medicare Other | Admitting: Podiatry

## 2020-11-18 DIAGNOSIS — B351 Tinea unguium: Secondary | ICD-10-CM | POA: Diagnosis not present

## 2020-11-18 DIAGNOSIS — M79674 Pain in right toe(s): Secondary | ICD-10-CM | POA: Diagnosis not present

## 2020-11-18 DIAGNOSIS — M79675 Pain in left toe(s): Secondary | ICD-10-CM

## 2020-11-18 NOTE — Progress Notes (Signed)
  Subjective:  Patient ID: Rosanne Ashing, male    DOB: 16-Aug-1932,  MRN: [id number]  Chief Complaint  Patient presents with   Nail Problem    Nail trim    87 y.o. male returns for the above complaint.  Patient presents with thickened elongated dystrophic onychomycosis bilaterally x10.  They are painful to palpation.  Patient states that they are very hard to and painful to ambulate on.  Patient would like to have the repair done.  He has no other acute concerns.  Objective:  There were no vitals filed for this visit. Podiatric Exam: Vascular: dorsalis pedis and posterior tibial pulses are palpable bilateral. Capillary return is immediate. Temperature gradient is WNL. Skin turgor WNL  Sensorium: Normal Semmes Weinstein monofilament test. Normal tactile sensation bilaterally. Nail Exam: Pt has thick disfigured discolored nails with subungual debris noted bilateral entire nail hallux through fifth toenails.  Pain on palpation to the nails. Ulcer Exam: There is no evidence of ulcer or pre-ulcerative changes or infection. Orthopedic Exam: Muscle tone and strength are WNL. No limitations in general ROM. No crepitus or effusions noted. HAV  B/L.  Hammer toes 2-5  B/L. Skin: Hyperkeratotic lesion noted to bilateral medial aspect of the hallux.  No pinpoint bleeding noted.  Pain on palpation to the lesion. No infection or ulcers.  Dry skin without fissure or pain to the bilateral lower extremity    Assessment & Plan:   1. Pain due to onychomycosis of toenails of both feet      Patient was evaluated and treated and all questions answered.  Xerosis bilateral lower extremity -I explained to the patient the etiology of xerosis and various treatment options were extensively discussed.  I explained to the patient the importance of maintaining moisturization of the skin with application of over-the-counter lotion such as Eucerin or Luciderm.  I have asked the patient to apply this twice a day.   If unable to resolve patient will benefit from prescription lotion.  Porokeratosis bilateral hallux x2 -I explained to patient the etiology of porokeratosis versus treatment options were discussed.  Given the amount of pain that is having I believe patient will benefit from aggressive debridement of the lesion.  Using chisel blade and handle the lesions were debrided down to healthy striated tissue.  No complication noted.  No pinpoint bleeding noted  Onychomycosis with pain  -Nails palliatively debrided as below. -Educated on self-care  Procedure: Nail Debridement Rationale: pain  Type of Debridement: manual, sharp debridement. Instrumentation: Nail nipper, rotary burr. Number of Nails: 10  Procedures and Treatment: Consent by patient was obtained for treatment procedures. The patient understood the discussion of treatment and procedures well. All questions were answered thoroughly reviewed. Debridement of mycotic and hypertrophic toenails, 1 through 5 bilateral and clearing of subungual debris. No ulceration, no infection noted.  Return Visit-Office Procedure: Patient instructed to return to the office for a follow up visit 3 months for continued evaluation and treatment.  [name], DPM    No follow-ups on file.

## 2020-11-22 ENCOUNTER — Emergency Department (HOSPITAL_COMMUNITY): Payer: Medicare Other

## 2020-11-22 ENCOUNTER — Inpatient Hospital Stay (HOSPITAL_COMMUNITY)
Admission: EM | Admit: 2020-11-22 | Discharge: 2020-11-26 | DRG: 510 | Disposition: A | Discharge status: Home / Self Care | Payer: Medicare Other | Attending: General Surgery | Admitting: General Surgery

## 2020-11-22 ENCOUNTER — Other Ambulatory Visit: Payer: Self-pay

## 2020-11-22 ENCOUNTER — Encounter (HOSPITAL_COMMUNITY): Payer: Self-pay | Admitting: Emergency Medicine

## 2020-11-22 ENCOUNTER — Inpatient Hospital Stay (HOSPITAL_COMMUNITY): Payer: Medicare Other

## 2020-11-22 DIAGNOSIS — S066X0A Traumatic subarachnoid hemorrhage without loss of consciousness, initial encounter: Secondary | ICD-10-CM | POA: Diagnosis not present

## 2020-11-22 DIAGNOSIS — R402252 Coma scale, best verbal response, oriented, at arrival to emergency department: Secondary | ICD-10-CM | POA: Diagnosis present

## 2020-11-22 DIAGNOSIS — I6782 Cerebral ischemia: Secondary | ICD-10-CM | POA: Diagnosis not present

## 2020-11-22 DIAGNOSIS — G319 Degenerative disease of nervous system, unspecified: Secondary | ICD-10-CM | POA: Diagnosis not present

## 2020-11-22 DIAGNOSIS — S066X9A Traumatic subarachnoid hemorrhage with loss of consciousness of unspecified duration, initial encounter: Secondary | ICD-10-CM | POA: Diagnosis present

## 2020-11-22 DIAGNOSIS — I10 Essential (primary) hypertension: Secondary | ICD-10-CM | POA: Diagnosis present

## 2020-11-22 DIAGNOSIS — Z8673 Personal history of transient ischemic attack (TIA), and cerebral infarction without residual deficits: Secondary | ICD-10-CM | POA: Diagnosis not present

## 2020-11-22 DIAGNOSIS — M79641 Pain in right hand: Secondary | ICD-10-CM | POA: Diagnosis not present

## 2020-11-22 DIAGNOSIS — M47814 Spondylosis without myelopathy or radiculopathy, thoracic region: Secondary | ICD-10-CM | POA: Diagnosis not present

## 2020-11-22 DIAGNOSIS — W19XXXA Unspecified fall, initial encounter: Secondary | ICD-10-CM

## 2020-11-22 DIAGNOSIS — F419 Anxiety disorder, unspecified: Secondary | ICD-10-CM | POA: Diagnosis present

## 2020-11-22 DIAGNOSIS — Z8249 Family history of ischemic heart disease and other diseases of the circulatory system: Secondary | ICD-10-CM | POA: Diagnosis not present

## 2020-11-22 DIAGNOSIS — R402362 Coma scale, best motor response, obeys commands, at arrival to emergency department: Secondary | ICD-10-CM | POA: Diagnosis present

## 2020-11-22 DIAGNOSIS — Z743 Need for continuous supervision: Secondary | ICD-10-CM | POA: Diagnosis not present

## 2020-11-22 DIAGNOSIS — Z9181 History of falling: Secondary | ICD-10-CM | POA: Diagnosis not present

## 2020-11-22 DIAGNOSIS — F32A Depression, unspecified: Secondary | ICD-10-CM | POA: Diagnosis present

## 2020-11-22 DIAGNOSIS — E876 Hypokalemia: Secondary | ICD-10-CM | POA: Diagnosis not present

## 2020-11-22 DIAGNOSIS — Z20822 Contact with and (suspected) exposure to covid-19: Secondary | ICD-10-CM | POA: Diagnosis present

## 2020-11-22 DIAGNOSIS — S52301S Unspecified fracture of shaft of right radius, sequela: Secondary | ICD-10-CM | POA: Diagnosis not present

## 2020-11-22 DIAGNOSIS — W010XXA Fall on same level from slipping, tripping and stumbling without subsequent striking against object, initial encounter: Secondary | ICD-10-CM | POA: Diagnosis present

## 2020-11-22 DIAGNOSIS — S0081XA Abrasion of other part of head, initial encounter: Secondary | ICD-10-CM | POA: Diagnosis not present

## 2020-11-22 DIAGNOSIS — S065X0A Traumatic subdural hemorrhage without loss of consciousness, initial encounter: Secondary | ICD-10-CM | POA: Diagnosis not present

## 2020-11-22 DIAGNOSIS — R402142 Coma scale, eyes open, spontaneous, at arrival to emergency department: Secondary | ICD-10-CM | POA: Diagnosis present

## 2020-11-22 DIAGNOSIS — S06360A Traumatic hemorrhage of cerebrum, unspecified, without loss of consciousness, initial encounter: Secondary | ICD-10-CM | POA: Diagnosis not present

## 2020-11-22 DIAGNOSIS — S065X9A Traumatic subdural hemorrhage with loss of consciousness of unspecified duration, initial encounter: Secondary | ICD-10-CM | POA: Diagnosis not present

## 2020-11-22 DIAGNOSIS — S199XXA Unspecified injury of neck, initial encounter: Secondary | ICD-10-CM | POA: Diagnosis not present

## 2020-11-22 DIAGNOSIS — Z87891 Personal history of nicotine dependence: Secondary | ICD-10-CM | POA: Diagnosis not present

## 2020-11-22 DIAGNOSIS — S52501A Unspecified fracture of the lower end of right radius, initial encounter for closed fracture: Secondary | ICD-10-CM | POA: Diagnosis not present

## 2020-11-22 DIAGNOSIS — K219 Gastro-esophageal reflux disease without esophagitis: Secondary | ICD-10-CM | POA: Diagnosis not present

## 2020-11-22 DIAGNOSIS — S52571A Other intraarticular fracture of lower end of right radius, initial encounter for closed fracture: Secondary | ICD-10-CM | POA: Diagnosis not present

## 2020-11-22 DIAGNOSIS — Z043 Encounter for examination and observation following other accident: Secondary | ICD-10-CM | POA: Diagnosis not present

## 2020-11-22 DIAGNOSIS — E7800 Pure hypercholesterolemia, unspecified: Secondary | ICD-10-CM | POA: Diagnosis present

## 2020-11-22 DIAGNOSIS — Y92009 Unspecified place in unspecified non-institutional (private) residence as the place of occurrence of the external cause: Secondary | ICD-10-CM | POA: Diagnosis not present

## 2020-11-22 DIAGNOSIS — I629 Nontraumatic intracranial hemorrhage, unspecified: Secondary | ICD-10-CM

## 2020-11-22 DIAGNOSIS — M5020 Other cervical disc displacement, unspecified cervical region: Secondary | ICD-10-CM | POA: Diagnosis not present

## 2020-11-22 DIAGNOSIS — S52301A Unspecified fracture of shaft of right radius, initial encounter for closed fracture: Secondary | ICD-10-CM | POA: Diagnosis not present

## 2020-11-22 DIAGNOSIS — S52321A Displaced transverse fracture of shaft of right radius, initial encounter for closed fracture: Secondary | ICD-10-CM | POA: Diagnosis not present

## 2020-11-22 DIAGNOSIS — S065XAA Traumatic subdural hemorrhage with loss of consciousness status unknown, initial encounter: Secondary | ICD-10-CM | POA: Diagnosis present

## 2020-11-22 DIAGNOSIS — S62101A Fracture of unspecified carpal bone, right wrist, initial encounter for closed fracture: Secondary | ICD-10-CM

## 2020-11-22 DIAGNOSIS — M189 Osteoarthritis of first carpometacarpal joint, unspecified: Secondary | ICD-10-CM | POA: Diagnosis not present

## 2020-11-22 DIAGNOSIS — M4312 Spondylolisthesis, cervical region: Secondary | ICD-10-CM | POA: Diagnosis not present

## 2020-11-22 DIAGNOSIS — R609 Edema, unspecified: Secondary | ICD-10-CM | POA: Diagnosis not present

## 2020-11-22 DIAGNOSIS — S0511XA Contusion of eyeball and orbital tissues, right eye, initial encounter: Secondary | ICD-10-CM | POA: Diagnosis not present

## 2020-11-22 DIAGNOSIS — S52601A Unspecified fracture of lower end of right ulna, initial encounter for closed fracture: Secondary | ICD-10-CM | POA: Diagnosis not present

## 2020-11-22 DIAGNOSIS — M47812 Spondylosis without myelopathy or radiculopathy, cervical region: Secondary | ICD-10-CM | POA: Diagnosis not present

## 2020-11-22 LAB — BASIC METABOLIC PANEL
Anion gap: 6 (ref 5–15)
BUN: 22 mg/dL (ref 8–23)
CO2: 32 mmol/L (ref 22–32)
Calcium: 9.5 mg/dL (ref 8.9–10.3)
Chloride: 100 mmol/L (ref 98–111)
Creatinine, Ser: 1.14 mg/dL (ref 0.61–1.24)
GFR, Estimated: >60 mL/min (ref >60)
Glucose, Bld: 105 mg/dL — ABNORMAL HIGH (ref 70–99)
Potassium: 3.6 mmol/L (ref 3.5–5.1)
Sodium: 138 mmol/L (ref 135–145)

## 2020-11-22 LAB — CBC WITH DIFFERENTIAL/PLATELET
Abs Immature Granulocytes: 0.06 10*3/uL (ref 0.00–0.07)
Basophils Absolute: 0 10*3/uL (ref 0.0–0.1)
Basophils Relative: 0 %
Eosinophils Absolute: 0.1 10*3/uL (ref 0.0–0.5)
Eosinophils Relative: 1 %
HCT: 45.4 % (ref 39.0–52.0)
Hemoglobin: 15.3 g/dL (ref 13.0–17.0)
Immature Granulocytes: 1 %
Lymphocytes Relative: 16 %
Lymphs Abs: 1.7 10*3/uL (ref 0.7–4.0)
MCH: 31.4 pg (ref 26.0–34.0)
MCHC: 33.7 g/dL (ref 30.0–36.0)
MCV: 93.2 fL (ref 80.0–100.0)
Monocytes Absolute: 0.6 10*3/uL (ref 0.1–1.0)
Monocytes Relative: 6 %
Neutro Abs: 8.6 10*3/uL — ABNORMAL HIGH (ref 1.7–7.7)
Neutrophils Relative %: 76 %
Platelets: 213 10*3/uL (ref 150–400)
RBC: 4.87 MIL/uL (ref 4.22–5.81)
RDW: 12.9 % (ref 11.5–15.5)
WBC: 11.2 10*3/uL — ABNORMAL HIGH (ref 4.0–10.5)
nRBC: 0 % (ref 0.0–0.2)

## 2020-11-22 LAB — RESP PANEL BY RT-PCR (FLU A&B, COVID) ARPGX2
Influenza A by PCR: NEGATIVE
Influenza B by PCR: NEGATIVE
SARS Coronavirus 2 by RT PCR: NEGATIVE

## 2020-11-22 MED ORDER — LEVETIRACETAM IN NACL 500 MG/100ML IV SOLN
500.0000 mg | Freq: Two times a day (BID) | INTRAVENOUS | Status: DC
  Administered 2020-11-23 – 2020-11-26 (×7): 500 mg via INTRAVENOUS
  Filled 2020-11-22 (×7): qty 100

## 2020-11-22 MED ORDER — POTASSIUM CHLORIDE IN NACL 20-0.9 MEQ/L-% IV SOLN
INTRAVENOUS | Status: DC
  Filled 2020-11-22 (×5): qty 1000

## 2020-11-22 MED ORDER — BISACODYL 10 MG RE SUPP: 10.0000 mg | Freq: Every day | RECTAL | Status: DC | PRN

## 2020-11-22 MED ORDER — OXYCODONE HCL 5 MG PO TABS
5.0000 mg | ORAL_TABLET | ORAL | Status: DC | PRN
  Administered 2020-11-22 – 2020-11-26 (×5): 5 mg via ORAL
  Filled 2020-11-22 (×5): qty 1

## 2020-11-22 MED ORDER — BUPROPION HCL ER (XL) 150 MG PO TB24
300.0000 mg | ORAL_TABLET | Freq: Every day | ORAL | Status: DC
  Administered 2020-11-22 – 2020-11-26 (×5): 300 mg via ORAL
  Filled 2020-11-22 (×2): qty 2
  Filled 2020-11-22: qty 1
  Filled 2020-11-22 (×2): qty 2

## 2020-11-22 MED ORDER — TRIAMTERENE-HCTZ 37.5-25 MG PO TABS
1.0000 | ORAL_TABLET | Freq: Every day | ORAL | Status: DC
  Administered 2020-11-23 – 2020-11-26 (×4): 1 via ORAL
  Filled 2020-11-22 (×4): qty 1

## 2020-11-22 MED ORDER — SERTRALINE HCL 50 MG PO TABS
50.0000 mg | ORAL_TABLET | Freq: Every day | ORAL | Status: DC
  Administered 2020-11-22 – 2020-11-26 (×5): 50 mg via ORAL
  Filled 2020-11-22 (×5): qty 1

## 2020-11-22 MED ORDER — DOCUSATE SODIUM 100 MG PO CAPS
100.0000 mg | ORAL_CAPSULE | Freq: Two times a day (BID) | ORAL | Status: DC
  Administered 2020-11-22 – 2020-11-26 (×9): 100 mg via ORAL
  Filled 2020-11-22 (×8): qty 1

## 2020-11-22 MED ORDER — PANTOPRAZOLE SODIUM 40 MG PO TBEC
40.0000 mg | DELAYED_RELEASE_TABLET | Freq: Every day | ORAL | Status: DC
  Administered 2020-11-22 – 2020-11-26 (×5): 40 mg via ORAL
  Filled 2020-11-22 (×5): qty 1

## 2020-11-22 MED ORDER — OXYCODONE HCL 5 MG PO TABS: 2.5000 mg | ORAL_TABLET | ORAL | Status: DC | PRN

## 2020-11-22 MED ORDER — CHLORHEXIDINE GLUCONATE CLOTH 2 % EX PADS
6.0000 | MEDICATED_PAD | Freq: Every day | CUTANEOUS | Status: DC
  Administered 2020-11-22 – 2020-11-26 (×4): 6 via TOPICAL

## 2020-11-22 MED ORDER — ONDANSETRON HCL 4 MG/2ML IJ SOLN: 4.0000 mg | Freq: Four times a day (QID) | INTRAMUSCULAR | Status: DC | PRN

## 2020-11-22 MED ORDER — TRAMADOL HCL 50 MG PO TABS: 50.0000 mg | ORAL_TABLET | Freq: Three times a day (TID) | ORAL | 0 refills | Status: DC | PRN

## 2020-11-22 MED ORDER — ACETAMINOPHEN 325 MG PO TABS: 650.0000 mg | ORAL_TABLET | ORAL | Status: DC | PRN

## 2020-11-22 MED ORDER — LEVETIRACETAM IN NACL 500 MG/100ML IV SOLN: 500.0000 mg | Freq: Two times a day (BID) | INTRAVENOUS | Status: DC

## 2020-11-22 MED ORDER — ONDANSETRON 4 MG PO TBDP: 4.0000 mg | ORAL_TABLET | Freq: Four times a day (QID) | ORAL | Status: DC | PRN

## 2020-11-22 MED ORDER — MORPHINE SULFATE (PF) 2 MG/ML IV SOLN
1.0000 mg | INTRAVENOUS | Status: DC | PRN
  Administered 2020-11-22 – 2020-11-23 (×4): 1 mg via INTRAVENOUS
  Filled 2020-11-22 (×4): qty 1

## 2020-11-22 MED ORDER — PANTOPRAZOLE SODIUM 40 MG IV SOLR
40.0000 mg | Freq: Every day | INTRAVENOUS | Status: DC
  Filled 2020-11-22: qty 40

## 2020-11-22 MED ORDER — MORPHINE SULFATE (PF) 4 MG/ML IV SOLN
4.0000 mg | Freq: Once | INTRAVENOUS | Status: AC
  Administered 2020-11-22: 4 mg via INTRAVENOUS
  Filled 2020-11-22: qty 1

## 2020-11-22 MED ORDER — LEVETIRACETAM IN NACL 500 MG/100ML IV SOLN
500.0000 mg | Freq: Once | INTRAVENOUS | Status: AC
  Administered 2020-11-22: 500 mg via INTRAVENOUS
  Filled 2020-11-22: qty 100

## 2020-11-22 NOTE — ED Triage Notes (Signed)
BIB EMS from home, had a mechanical hall, tripped outside, hit his face, laceration to R forehead and upper eyebrow, pupils PERLLA, alert and oriented, takes strong-antiplatelet medication (aspirin w/ dipyridamole). R wrist deformity as well, pulse, movement and sensory intact. Denies LOC.

## 2020-11-22 NOTE — Progress Notes (Signed)
Patient arrived on unit.

## 2020-11-22 NOTE — Progress Notes (Signed)
Orthopedic Tech Progress Note Patient Details:  Peter Gallegos 01/11/1933 [id number]   Ortho Devices Type of Ortho Device: Sugartong splint Ortho Device/Splint Location: RUE Ortho Device/Splint Interventions: Ordered, Application, Adjustment   Post Interventions Patient Tolerated: Well Instructions Provided: Care of device  Keyetta Hollingworth Jeri Modena 11/22/2020, 11:56 AM

## 2020-11-22 NOTE — ED Provider Notes (Addendum)
[facility] DEPT Provider Note   CSN: [id number] Arrival date & time: 11/22/20  0748     History Chief Complaint  Patient presents with   Peter Gallegos is a 87 y.o. male.  Mechanical fall at home.  Pain mostly to the right wrist.  Abrasions over the right side of the face.  Did not lose consciousness.  Not on blood thinners but on aggrenox.  No neck pain or headache.  The history is provided by the patient.  Fall This is a new problem. The current episode started less than 1 hour ago. The problem has been resolved. Pertinent negatives include no chest pain, no abdominal pain, no headaches and no shortness of breath. Nothing aggravates the symptoms. Nothing relieves the symptoms. He has tried nothing for the symptoms. The treatment provided no relief.      Past Medical History:  Diagnosis Date   Anxiety and depression    Depression    Diverticulosis    Family history of coronary artery disease    GERD (gastroesophageal reflux disease)    History of arm fracture    Rt arm, LT elbow fracture   History of colon polyps    History of ETOH abuse    Quit in 1980   Hypercholesterolemia    Hypertension    PONV (postoperative nausea and vomiting)    TIA (transient ischemic attack) 12/2005    Patient Active Problem List   Diagnosis Date Noted   H/O adenomatous polyp of colon 02/18/2013   Hx of transient ischemic attack (TIA) 02/18/2013   Depression 02/18/2013   Other and unspecified hyperlipidemia 02/18/2013    Past Surgical History:  Procedure Laterality Date   COLONOSCOPY     OPEN REDUCTION INTERNAL FIXATION (ORIF) DISTAL PHALANX Left 11/21/2013   Procedure: OPEN REDUCTION INTERNAL FIXATION (ORIF) DISTAL PHALANX;  Surgeon: Linna Hoff, MD;  Location: [facility];  Service: Orthopedics;  Laterality: Left;       Family History  Problem Relation Age of Onset   Heart disease Father        Deceased 34 ( Had MI )   Heart disease  Mother     Social History   Tobacco Use   Smoking status: Former    Pack years: 0.00    Types: Pipe   Smokeless tobacco: Never   Tobacco comments:    Quit smoking pipe years ago  Substance Use Topics   Alcohol use: No   Drug use: No    Home Medications Prior to Admission medications   Medication Sig Start Date End Date Taking? Authorizing Provider  alendronate (FOSAMAX) 70 MG tablet Take 70 mg by mouth once a week. 10/20/19  Yes [provider]  atorvastatin (LIPITOR) 40 MG tablet Take 40 mg by mouth daily. 06/27/19  Yes [provider]  buPROPion (WELLBUTRIN XL) 300 MG 24 hr tablet Take 300 mg by mouth daily.   Yes [provider]  CALCIUM PO Take 1 tablet by mouth daily.   Yes [provider]  Cholecalciferol (VITAMIN D) 125 MCG (5000 UT) CAPS Take 5,000 Units by mouth daily.   Yes [provider]  dipyridamole-aspirin (AGGRENOX) 200-25 MG 12hr capsule Take 1 capsule by mouth 2 (two) times daily.   Yes [provider]  Multiple Vitamin (MULTIVITAMIN WITH MINERALS) TABS tablet Take 1 tablet by mouth daily.   Yes [provider]  potassium chloride (KLOR-CON) 10 MEQ tablet Take 10 mEq by  mouth daily. 11/09/20  Yes [provider]  sertraline (ZOLOFT) 50 MG tablet Take 50 mg by mouth daily.   Yes [provider]  triamterene-hydrochlorothiazide (MAXZIDE-25) 37.5-25 MG per tablet Take 1 tablet by mouth daily.   Yes [provider]    Allergies    Patient has no known allergies.  Review of Systems   Review of Systems  Constitutional:  Negative for chills and fever.  HENT:  Negative for ear pain and sore throat.   Eyes:  Negative for pain and visual disturbance.  Respiratory:  Negative for cough and shortness of breath.   Cardiovascular:  Negative for chest pain and palpitations.  Gastrointestinal:  Negative for abdominal pain and vomiting.  Genitourinary:  Negative for dysuria and hematuria.   Musculoskeletal:  Positive for arthralgias. Negative for back pain.  Skin:  Positive for wound. Negative for color change and rash.  Neurological:  Negative for seizures, syncope and headaches.  All other systems reviewed and are negative.  Physical Exam Updated Vital Signs BP 133/65 (BP Location: Right Arm)   Pulse 62   Temp 98.6 F (37 C) (Oral)   Resp 16   Ht 5\' 6"  (1.676 m)   Wt 68 kg   SpO2 99%   BMI 24.20 kg/m   Physical Exam Vitals and nursing note reviewed.  Constitutional:      Appearance: He is well-developed.  HENT:     Head:     Comments: Abrasion over the right side of the face and forehead    Nose: Nose normal.     Mouth/Throat:     Mouth: Mucous membranes are moist.  Eyes:     Extraocular Movements: Extraocular movements intact.     Conjunctiva/sclera: Conjunctivae normal.     Pupils: Pupils are equal, round, and reactive to light.  Neck:     Comments: In cervical collar Cardiovascular:     Rate and Rhythm: Normal rate and regular rhythm.     Heart sounds: No murmur heard. Pulmonary:     Effort: Pulmonary effort is normal. No respiratory distress.     Breath sounds: Normal breath sounds.  Abdominal:     Palpations: Abdomen is soft.     Tenderness: There is no abdominal tenderness.  Musculoskeletal:        General: Swelling and tenderness present.     Cervical back: Neck supple. No tenderness.     Comments: Tenderness and swelling to the back of the right hand/wrist   Skin:    General: Skin is warm and dry.     Capillary Refill: Capillary refill takes less than 2 seconds.  Neurological:     General: No focal deficit present.     Mental Status: He is alert and oriented to person, place, and time.     Cranial Nerves: No cranial nerve deficit.     Sensory: No sensory deficit.     Coordination: Coordination normal.    ED Results / Procedures / Treatments   Labs (all labs ordered are listed, but only abnormal results are displayed) Labs Reviewed   CBC WITH DIFFERENTIAL/PLATELET - Abnormal; Notable for the following components:      Result Value   WBC 11.2 (*)    Neutro Abs 8.6 (*)    All other components within normal limits  BASIC METABOLIC PANEL - Abnormal; Notable for the following components:   Glucose, Bld 105 (*)    All other components within normal limits  RESP PANEL BY RT-PCR (FLU  A&B, COVID) [id number]    EKG None  Radiology DG Chest 2 View  Result Date: 11/22/2020 CLINICAL DATA:  Status post fall. EXAM: CHEST - 2 VIEW COMPARISON:  Chest radiograph 11/21/2013. FINDINGS: Stable cardiac and mediastinal contours. Low lung volumes. No large area pulmonary consolidation. No pleural effusion or pneumothorax. Thoracic spine degenerative changes. IMPRESSION: No acute cardiopulmonary process.  Low lung volumes. Electronically Signed   By: Lovey Newcomer M.D.   On: 11/22/2020 10:10   DG Pelvis 1-2 Views  Result Date: 11/22/2020 CLINICAL DATA:  87 year old male status post fall with pain. EXAM: PELVIS - 1-2 VIEW COMPARISON:  None. FINDINGS: Femoral heads are normally located. Grossly intact proximal femurs. No pelvis fracture identified. Questionable partial ankylosis of the SI joints, more so the right. Negative lower abdominal and pelvic visceral contours. IMPRESSION: No acute fracture or dislocation identified about the pelvis. Electronically Signed   By: Genevie Ann M.D.   On: 11/22/2020 10:17   DG Wrist Complete Right  Result Date: 11/22/2020 CLINICAL DATA:  87 year old male status post fall with pain. EXAM: RIGHT WRIST - COMPLETE 3+ VIEW COMPARISON:  Right hand series 07/24/2010. FINDINGS: Comminuted and impacted distal right radius fracture. Moderate dorsal impaction. Radiocarpal joint and DRU involvement. Mildly displaced ulnar styloid fracture. Carpal bone alignment is maintained. No carpal bone fracture identified. Metacarpals appear stable and intact. IMPRESSION: 1. Comminuted anddistal right radius fracture with moderate dorsal  impaction. Radiocarpal and DRU involvement. 2. Mildly displaced ulnar styloid fracture. Electronically Signed   By: Genevie Ann M.D.   On: 11/22/2020 10:17   CT Head Wo Contrast  Result Date: 11/22/2020 CLINICAL DATA:  Fall.  Laceration to forehead and upper eyebrow. EXAM: CT HEAD WITHOUT CONTRAST CT CERVICAL SPINE WITHOUT CONTRAST TECHNIQUE: Multidetector CT imaging of the head and cervical spine was performed following the standard protocol without intravenous contrast. Multiplanar CT image reconstructions of the cervical spine were also generated. COMPARISON:  Brain MRI 12/13/2005. FINDINGS: CT HEAD FINDING: Brain: Generalized cerebral and cerebellar atrophy. Thin acute subdural hematoma overlying the anterior right frontal lobe, measuring 3 mm in thickness. Additional small volume acute subarachnoid hemorrhage overlying the anterior right frontal lobe. Underlying right frontal lobe hemorrhagic parenchymal contusion cannot be excluded. Associated mass effect with 2 mm leftward midline shift measured at the level of the septum pellucidum. Advanced patchy and confluent hypoattenuation within the cerebral white matter, nonspecific but compatible with chronic small vessel ischemic disease. No demarcated cortical infarct. No extra-axial fluid collection. No evidence of intracranial mass. No midline shift. Vascular: No hyperdense vessel.  Atherosclerotic calcifications. Skull: Normal. Negative for fracture or focal lesion. Sinuses/Orbits: Visualized orbits show no acute finding. No significant paranasal sinus disease at the imaged levels. Other: Right forehead, periorbital and maxillofacial hematoma. CT CERVICAL SPINE FINDINGS Alignment: Trace C4-C5 grade 1 anterolisthesis. Skull base and vertebrae: The basion-dental and atlanto-dental intervals are maintained.No evidence of acute fracture to the cervical spine. Soft tissues and spinal canal: No prevertebral fluid or swelling. No visible canal hematoma. Disc levels:  Cervical spondylosis with multilevel disc space narrowing, disc bulges, posterior disc osteophytes, uncovertebral hypertrophy and facet arthrosis. Multilevel spinal canal stenosis. Most notably, a C5-C6 posterior disc osteophyte complex contributes to suspected mild/moderate spinal canal stenosis. Multilevel bony neural foraminal narrowing. Upper chest: No consolidation within the imaged lung apices. No visible pneumothorax. These results were called by telephone at the time of interpretation on 11/22/2020 at 9:20 am to provider Andrej Spagnoli , who verbally acknowledged these results. IMPRESSION: CT head:  1. Thin acute subdural hematoma overlying the anterior right frontal lobe. 2. Small volume acute subarachnoid hemorrhage overlying the anterior right frontal lobe. 3. Underlying right frontal lobe hemorrhagic parenchymal contusion is difficult to exclude. 4. Mass effect with 2 mm leftward midline shift. 5. Right forehead, periorbital and maxillofacial hematoma. 6. Generalized atrophy with advanced cerebral white matter chronic small vessel ischemic disease. CT cervical spine: 1. No evidence of acute fracture to the cervical spine. 2. Trace C4-C5 grade 1 anterolisthesis. 3. Cervical spondylosis, as described. Electronically Signed   By: Kellie Simmering DO   On: 11/22/2020 09:21   CT Cervical Spine Wo Contrast  Result Date: 11/22/2020 CLINICAL DATA:  Fall.  Laceration to forehead and upper eyebrow. EXAM: CT HEAD WITHOUT CONTRAST CT CERVICAL SPINE WITHOUT CONTRAST TECHNIQUE: Multidetector CT imaging of the head and cervical spine was performed following the standard protocol without intravenous contrast. Multiplanar CT image reconstructions of the cervical spine were also generated. COMPARISON:  Brain MRI 12/13/2005. FINDINGS: CT HEAD FINDING: Brain: Generalized cerebral and cerebellar atrophy. Thin acute subdural hematoma overlying the anterior right frontal lobe, measuring 3 mm in thickness. Additional small volume  acute subarachnoid hemorrhage overlying the anterior right frontal lobe. Underlying right frontal lobe hemorrhagic parenchymal contusion cannot be excluded. Associated mass effect with 2 mm leftward midline shift measured at the level of the septum pellucidum. Advanced patchy and confluent hypoattenuation within the cerebral white matter, nonspecific but compatible with chronic small vessel ischemic disease. No demarcated cortical infarct. No extra-axial fluid collection. No evidence of intracranial mass. No midline shift. Vascular: No hyperdense vessel.  Atherosclerotic calcifications. Skull: Normal. Negative for fracture or focal lesion. Sinuses/Orbits: Visualized orbits show no acute finding. No significant paranasal sinus disease at the imaged levels. Other: Right forehead, periorbital and maxillofacial hematoma. CT CERVICAL SPINE FINDINGS Alignment: Trace C4-C5 grade 1 anterolisthesis. Skull base and vertebrae: The basion-dental and atlanto-dental intervals are maintained.No evidence of acute fracture to the cervical spine. Soft tissues and spinal canal: No prevertebral fluid or swelling. No visible canal hematoma. Disc levels: Cervical spondylosis with multilevel disc space narrowing, disc bulges, posterior disc osteophytes, uncovertebral hypertrophy and facet arthrosis. Multilevel spinal canal stenosis. Most notably, a C5-C6 posterior disc osteophyte complex contributes to suspected mild/moderate spinal canal stenosis. Multilevel bony neural foraminal narrowing. Upper chest: No consolidation within the imaged lung apices. No visible pneumothorax. These results were called by telephone at the time of interpretation on 11/22/2020 at 9:20 am to provider Kinsie Belford , who verbally acknowledged these results. IMPRESSION: CT head: 1. Thin acute subdural hematoma overlying the anterior right frontal lobe. 2. Small volume acute subarachnoid hemorrhage overlying the anterior right frontal lobe. 3. Underlying right  frontal lobe hemorrhagic parenchymal contusion is difficult to exclude. 4. Mass effect with 2 mm leftward midline shift. 5. Right forehead, periorbital and maxillofacial hematoma. 6. Generalized atrophy with advanced cerebral white matter chronic small vessel ischemic disease. CT cervical spine: 1. No evidence of acute fracture to the cervical spine. 2. Trace C4-C5 grade 1 anterolisthesis. 3. Cervical spondylosis, as described. Electronically Signed   By: Kellie Simmering DO   On: 11/22/2020 09:21   DG Hand Complete Left  Result Date: 11/22/2020 CLINICAL DATA:  Status post fall. EXAM: LEFT HAND - COMPLETE 3+ VIEW COMPARISON:  None. FINDINGS: [facility] joint degenerative changes. Oblique lucency at the base of the first metacarpal likely secondary to degenerative changes. The IP joint degenerative changes. Normal anatomic alignment. No evidence for acute fracture or dislocation. Regional soft  tissues are unremarkable. IMPRESSION: There is an oblique lucency at the base of the first metacarpal likely secondary to degenerative changes. Recommend correlation for point tenderness to exclude the possibility of nondisplaced fracture. Electronically Signed   By: Lovey Newcomer M.D.   On: 11/22/2020 10:09   DG Hand Complete Right  Result Date: 11/22/2020 CLINICAL DATA:  87 year old male status post fall with pain. EXAM: RIGHT HAND - COMPLETE 3+ VIEW COMPARISON:  Right wrist series today. Right hand series 07/24/2010. FINDINGS: Distal radius and ulna fractures reported separately. Carpal bone alignment appears maintained. Metacarpals appear stable since 2012 and intact, probably with a remote 5th metacarpal fracture, mild volar angulation there. Second MCP joint space loss since that time. Phalanges appear intact. Normal distal joint spaces for age. IMPRESSION: 1. Distal radius and ulna fractures detailed on the wrist series. 2. No other acute fracture or dislocation identified about the right hand. Electronically Signed    By: Genevie Ann M.D.   On: 11/22/2020 10:19   CT Maxillofacial Wo Contrast  Result Date: 11/22/2020 CLINICAL DATA:  87 year old male status post fall with pain. EXAM: CT MAXILLOFACIAL WITHOUT CONTRAST TECHNIQUE: Multidetector CT imaging of the maxillofacial structures was performed. Multiplanar CT image reconstructions were also generated. COMPARISON:  Head and cervical spine CT 0822 hours today. FINDINGS: Osseous: Absent dentition. Mandible intact and normally located. No maxilla or zygoma fracture. Pterygoid plates are intact. Central skull base appears intact. Visible calvarium intact. Orbits: No orbital wall fracture. Right forehead and moderate sized right periorbital and lateral face superficial hematoma, also visible on the earlier head CT. Globes and intraorbital soft tissues appears symmetric and normal. Sinuses: Clear throughout. Soft tissues: Negative visible noncontrast deep soft tissue spaces of the face. Limited intracranial: Mixed density right anterior subdural hematoma appears stable since 0822 hours. Stable ventricle size and configuration. Basilar cisterns remain normal. IMPRESSION: 1. No facial fracture identified. 2. Right forehead, periorbital, and lateral face superficial hematoma as seen on the earlier head CT. 3. Visible right side subdural hematoma is stable since 0822 hours. Electronically Signed   By: Genevie Ann M.D.   On: 11/22/2020 10:23    Procedures .Critical Care  Date/Time: 11/22/2020 10:24 AM Performed by: Lennice Sites, DO Authorized by: Lennice Sites, DO   Critical care provider statement:    Critical care time (minutes):  45   Critical care time was exclusive of:  Separately billable procedures and treating other patients   Critical care was necessary to treat or prevent imminent or life-threatening deterioration of the following conditions: intracranial hemorrhage.   Critical care was time spent personally by me on the following activities:  Blood draw for specimens,  development of treatment plan with patient or surrogate, discussions with consultants, discussions with primary provider, evaluation of patient's response to treatment, examination of patient, interpretation of cardiac output measurements, obtaining history from patient or surrogate, ordering and performing treatments and interventions, ordering and review of laboratory studies, ordering and review of radiographic studies, pulse oximetry, re-evaluation of patient's condition and review of old charts   I assumed direction of critical care for this patient from another provider in my specialty: no     Care discussed with: admitting provider     Medications Ordered in ED Medications - No data to display  ED Course  I have reviewed the triage vital signs and the nursing notes.  Pertinent labs & imaging results that were available during my care of the patient were reviewed by me and considered in  my medical decision making (see chart for details).    MDM Rules/Calculators/A&P                          Rosanne Ashing is here after mechanical fall.  History of TIA, hypertension.  Not on blood thinners but on aggrenox.  Abrasion to the right side of the face and forehead with associated swelling around right side of face, EOM intact.  Pain mostly to the right wrist and hand. There is swelling to the back of the right hand/wrist.  Will get CT scan of the head, neck, extremity x-rays.  Patient with small subdural head bleed with some subarachnoid hemorrhage with 3 mm shift.  Talked with Dr. Venetia Constable with neurosurgery who recommends repeat head CT in about 6 to 8 hours.  Neurologically patient is intact.  Patient also with right wrist fracture.  Will place in a sugar-tong splint and talk with orthopedics, patient prefers [facility] as Dr. Amedeo Plenty has done prior surgeries.  Neurovascularly intact.  Does not really have any focal tenderness on left hand exam where lucency is suspect arthritis.  Will admit  to trauma team.  This chart was dictated using voice recognition software.  Despite best efforts to proofread,  errors can occur which can change the documentation meaning.   Final Clinical Impression(s) / ED Diagnoses Final diagnoses:  Intracranial bleed ([place])  Closed fracture of right wrist, initial encounter  Fall, initial encounter    Rx / DC Orders ED Discharge Orders          Ordered    traMADol (ULTRAM) 50 MG tablet  Every 8 hours PRN,   Status:  Discontinued        11/22/20 Mathews, [place], DO 11/22/20 [place], [place], DO 11/22/20 [place], [place], DO 11/22/20 1047

## 2020-11-22 NOTE — ED Notes (Signed)
Called [facility] ortho tech for sugar tong, will be over when available to apply sugar tong splint.

## 2020-11-22 NOTE — ED Notes (Signed)
Bed withdrawn, [facility] cancelled.

## 2020-11-22 NOTE — ED Notes (Addendum)
Attempted to call report to floor, stated I needed to call back in about 15 min. [facility] called for transport. [facility] papers at bedside.

## 2020-11-22 NOTE — H&P (Signed)
CC: I fell  Requesting provider: Dr Clelia Schaumann  HPI: Peter Gallegos is an 87 y.o. male who is here for evaluation after sustaining a ground-level fall earlier this morning around 6 AM.  He states that he tripped on the corner of a iron chair outside and landed on his right side on the concrete patio.  He denies loss of consciousness.  He states he mainly hurts in his right wrist.  He denies any neck pain, back pain, abdominal pain, left upper extremity, or bilateral lower extremity.  He denies any hip pain.  He denies any headache or blurry vision.  He denies any jaw pain.  He has hypertension.  He has a remote history of TIA and is on twice a day Aggrenox  Lives at home with his wife.  Has had several orthopedic procedures performed by [facility] and request them to manage his radius and ulnar fracture  Past Medical History:  Diagnosis Date   Anxiety and depression    Depression    Diverticulosis    Family history of coronary artery disease    GERD (gastroesophageal reflux disease)    History of arm fracture    Rt arm, LT elbow fracture   History of colon polyps    History of ETOH abuse    Quit in 1980   Hypercholesterolemia    Hypertension    PONV (postoperative nausea and vomiting)    TIA (transient ischemic attack) 12/2005    Past Surgical History:  Procedure Laterality Date   COLONOSCOPY     OPEN REDUCTION INTERNAL FIXATION (ORIF) DISTAL PHALANX Left 11/21/2013   Procedure: OPEN REDUCTION INTERNAL FIXATION (ORIF) DISTAL PHALANX;  Surgeon: Linna Hoff, MD;  Location: [facility];  Service: Orthopedics;  Laterality: Left;    Family History  Problem Relation Age of Onset   Heart disease Father        Deceased 61 ( Had MI )   Heart disease Mother     Social:  reports that he has quit smoking. His smoking use included pipe. He has never used smokeless tobacco. He reports that he does not drink alcohol and does not use drugs.  Allergies: No Known  Allergies  Medications: I have reviewed the patient's current medications.   ROS - all of the below systems have been reviewed with the patient and positives are indicated with bold text General: chills, fever or night sweats Eyes: blurry vision or double vision ENT: epistaxis or sore throat Allergy/Immunology: itchy/watery eyes or nasal congestion Hematologic/Lymphatic: bleeding problems, blood clots or swollen lymph nodes Endocrine: temperature intolerance or unexpected weight changes Breast: new or changing breast lumps or nipple discharge Resp: cough, shortness of breath, or wheezing CV: chest pain or dyspnea on exertion GI: as per HPI GU: dysuria, trouble voiding, or hematuria MSK: joint pain or joint stiffness Neuro: remote h/o TIA TIA or stroke symptoms Derm: pruritus and skin lesion changes Psych: anxiety and depression  PE Blood pressure 128/68, pulse (!) 57, temperature 98.6 F (37 C), temperature source Oral, resp. rate 17, height 5\' 6"  (1.676 m), weight 68 kg, SpO2 99 %. Constitutional: NAD; conversant; no deformities Head: large right forehead/cheek abrasion Ears: ext ear wnl, pinna wnl, TM ok Mouth: tongue midline, edentulous Eyes: Moist conjunctiva; no lid lag; anicteric; PERRL, EOMI Neck: Trachea midline; no thyromegaly Lungs: Normal respiratory effort; no tactile fremitus CV: RRR; no palpable thrills; no pitting edema GI: Abd soft, nt, nd; no palpable hepatosplenomegaly MSK:  no clubbing/cyanosis;' RUE in  sugar tong splint. Good cap refill, able to move fingers, sensation grossly intact; other extremities - no palpable deformities, MAE, good pulses Back: no step offs, no midline tenderness Psychiatric: Appropriate affect; alert and oriented x3 Lymphatic: No palpable cervical or axillary lymphadenopathy Skin:abrasions as above  Results for orders placed or performed during the hospital encounter of 11/22/20 (from the past 48 hour(s))  CBC with Differential      Status: Abnormal   Collection Time: 11/22/20  9:24 AM  Result Value Ref Range   WBC 11.2 (H) 4.0 - 10.5 K/uL   RBC 4.87 4.22 - 5.81 MIL/uL   Hemoglobin 15.3 13.0 - 17.0 g/dL   HCT 45.4 39.0 - 52.0 %   MCV 93.2 80.0 - 100.0 fL   MCH 31.4 26.0 - 34.0 pg   MCHC 33.7 30.0 - 36.0 g/dL   RDW 12.9 11.5 - 15.5 %   Platelets 213 150 - 400 K/uL   nRBC 0.0 0.0 - 0.2 %   Neutrophils Relative % 76 %   Neutro Abs 8.6 (H) 1.7 - 7.7 K/uL   Lymphocytes Relative 16 %   Lymphs Abs 1.7 0.7 - 4.0 K/uL   Monocytes Relative 6 %   Monocytes Absolute 0.6 0.1 - 1.0 K/uL   Eosinophils Relative 1 %   Eosinophils Absolute 0.1 0.0 - 0.5 K/uL   Basophils Relative 0 %   Basophils Absolute 0.0 0.0 - 0.1 K/uL   Immature Granulocytes 1 %   Abs Immature Granulocytes 0.06 0.00 - 0.07 K/uL    Comment: Performed at [facility], [place] [address], [place], [place] [address]  Basic metabolic panel     Status: Abnormal   Collection Time: 11/22/20  9:24 AM  Result Value Ref Range   Sodium 138 135 - 145 mmol/L   Potassium 3.6 3.5 - 5.1 mmol/L   Chloride 100 98 - 111 mmol/L   CO2 32 22 - 32 mmol/L   Glucose, Bld 105 (H) 70 - 99 mg/dL    Comment: Glucose reference range applies only to samples taken after fasting for at least 8 hours.   BUN 22 8 - 23 mg/dL   Creatinine, Ser 1.14 0.61 - 1.24 mg/dL   Calcium 9.5 8.9 - 10.3 mg/dL   GFR, Estimated >60 >60 mL/min    Comment: (NOTE) Calculated using the CKD-EPI Creatinine Equation (2021)    Anion gap 6 5 - 15    Comment: Performed at [facility], [place] [address] [place], [place] [address]  Resp Panel by RT-PCR (Flu A&B, Covid) Nasopharyngeal Swab     Status: None   Collection Time: 11/22/20  9:25 AM   Specimen: Nasopharyngeal Swab; Nasopharyngeal(NP) swabs in vial transport medium  Result Value Ref Range   SARS Coronavirus 2 by RT PCR NEGATIVE NEGATIVE    Comment: (NOTE) SARS-CoV-2 target nucleic acids are NOT DETECTED.  The  SARS-CoV-2 RNA is generally detectable in upper respiratory specimens during the acute phase of infection. The lowest concentration of SARS-CoV-2 viral copies this assay can detect is 138 copies/mL. A negative result does not preclude SARS-Cov-2 infection and should not be used as the sole basis for treatment or other patient management decisions. A negative result may occur with  improper specimen collection/handling, submission of specimen other than nasopharyngeal swab, presence of viral mutation(s) within the areas targeted by this assay, and inadequate number of viral copies(<138 copies/mL). A negative result must be combined with clinical observations, patient history, and epidemiological information.  The expected result is Negative.  Fact Sheet for Patients:  EntrepreneurPulse.com.au  Fact Sheet for Healthcare Providers:  IncredibleEmployment.be  This test is no t yet approved or cleared by the [place] FDA and  has been authorized for detection and/or diagnosis of SARS-CoV-2 by FDA under an Emergency Use Authorization (EUA). This EUA will remain  in effect (meaning this test can be used) for the duration of the COVID-19 declaration under Section 564(b)(1) of the Act, 21 U.S.C.section 360bbb-3(b)(1), unless the authorization is terminated  or revoked sooner.       Influenza A by PCR NEGATIVE NEGATIVE   Influenza B by PCR NEGATIVE NEGATIVE    Comment: (NOTE) The Xpert Xpress SARS-CoV-2/FLU/RSV plus assay is intended as an aid in the diagnosis of influenza from Nasopharyngeal swab specimens and should not be used as a sole basis for treatment. Nasal washings and aspirates are unacceptable for Xpert Xpress SARS-CoV-2/FLU/RSV testing.  Fact Sheet for Patients: EntrepreneurPulse.com.au  Fact Sheet for Healthcare Providers: IncredibleEmployment.be  This test is not yet approved or cleared by the  [place] FDA and has been authorized for detection and/or diagnosis of SARS-CoV-2 by FDA under an Emergency Use Authorization (EUA). This EUA will remain in effect (meaning this test can be used) for the duration of the COVID-19 declaration under Section 564(b)(1) of the Act, 21 U.S.C. section 360bbb-3(b)(1), unless the authorization is terminated or revoked.  Performed at [facility], [place] [address] [place], [place] [address]     DG Chest 2 View  Result Date: 11/22/2020 CLINICAL DATA:  Status post fall. EXAM: CHEST - 2 VIEW COMPARISON:  Chest radiograph 11/21/2013. FINDINGS: Stable cardiac and mediastinal contours. Low lung volumes. No large area pulmonary consolidation. No pleural effusion or pneumothorax. Thoracic spine degenerative changes. IMPRESSION: No acute cardiopulmonary process.  Low lung volumes. Electronically Signed   By: Lovey Newcomer M.D.   On: 11/22/2020 10:10   DG Pelvis 1-2 Views  Result Date: 11/22/2020 CLINICAL DATA:  87 year old male status post fall with pain. EXAM: PELVIS - 1-2 VIEW COMPARISON:  None. FINDINGS: Femoral heads are normally located. Grossly intact proximal femurs. No pelvis fracture identified. Questionable partial ankylosis of the SI joints, more so the right. Negative lower abdominal and pelvic visceral contours. IMPRESSION: No acute fracture or dislocation identified about the pelvis. Electronically Signed   By: Genevie Ann M.D.   On: 11/22/2020 10:17   DG Wrist Complete Right  Result Date: 11/22/2020 CLINICAL DATA:  87 year old male status post fall with pain. EXAM: RIGHT WRIST - COMPLETE 3+ VIEW COMPARISON:  Right hand series 07/24/2010. FINDINGS: Comminuted and impacted distal right radius fracture. Moderate dorsal impaction. Radiocarpal joint and DRU involvement. Mildly displaced ulnar styloid fracture. Carpal bone alignment is maintained. No carpal bone fracture identified. Metacarpals appear stable and intact. IMPRESSION: 1.  Comminuted anddistal right radius fracture with moderate dorsal impaction. Radiocarpal and DRU involvement. 2. Mildly displaced ulnar styloid fracture. Electronically Signed   By: Genevie Ann M.D.   On: 11/22/2020 10:17   CT Head Wo Contrast  Result Date: 11/22/2020 CLINICAL DATA:  Fall.  Laceration to forehead and upper eyebrow. EXAM: CT HEAD WITHOUT CONTRAST CT CERVICAL SPINE WITHOUT CONTRAST TECHNIQUE: Multidetector CT imaging of the head and cervical spine was performed following the standard protocol without intravenous contrast. Multiplanar CT image reconstructions of the cervical spine were also generated. COMPARISON:  Brain MRI 12/13/2005. FINDINGS: CT HEAD FINDING: Brain: Generalized cerebral and cerebellar atrophy. Thin acute subdural hematoma overlying the  anterior right frontal lobe, measuring 3 mm in thickness. Additional small volume acute subarachnoid hemorrhage overlying the anterior right frontal lobe. Underlying right frontal lobe hemorrhagic parenchymal contusion cannot be excluded. Associated mass effect with 2 mm leftward midline shift measured at the level of the septum pellucidum. Advanced patchy and confluent hypoattenuation within the cerebral white matter, nonspecific but compatible with chronic small vessel ischemic disease. No demarcated cortical infarct. No extra-axial fluid collection. No evidence of intracranial mass. No midline shift. Vascular: No hyperdense vessel.  Atherosclerotic calcifications. Skull: Normal. Negative for fracture or focal lesion. Sinuses/Orbits: Visualized orbits show no acute finding. No significant paranasal sinus disease at the imaged levels. Other: Right forehead, periorbital and maxillofacial hematoma. CT CERVICAL SPINE FINDINGS Alignment: Trace C4-C5 grade 1 anterolisthesis. Skull base and vertebrae: The basion-dental and atlanto-dental intervals are maintained.No evidence of acute fracture to the cervical spine. Soft tissues and spinal canal: No  prevertebral fluid or swelling. No visible canal hematoma. Disc levels: Cervical spondylosis with multilevel disc space narrowing, disc bulges, posterior disc osteophytes, uncovertebral hypertrophy and facet arthrosis. Multilevel spinal canal stenosis. Most notably, a C5-C6 posterior disc osteophyte complex contributes to suspected mild/moderate spinal canal stenosis. Multilevel bony neural foraminal narrowing. Upper chest: No consolidation within the imaged lung apices. No visible pneumothorax. These results were called by telephone at the time of interpretation on 11/22/2020 at 9:20 am to provider ADAM CURATOLO , who verbally acknowledged these results. IMPRESSION: CT head: 1. Thin acute subdural hematoma overlying the anterior right frontal lobe. 2. Small volume acute subarachnoid hemorrhage overlying the anterior right frontal lobe. 3. Underlying right frontal lobe hemorrhagic parenchymal contusion is difficult to exclude. 4. Mass effect with 2 mm leftward midline shift. 5. Right forehead, periorbital and maxillofacial hematoma. 6. Generalized atrophy with advanced cerebral white matter chronic small vessel ischemic disease. CT cervical spine: 1. No evidence of acute fracture to the cervical spine. 2. Trace C4-C5 grade 1 anterolisthesis. 3. Cervical spondylosis, as described. Electronically Signed   By: Kellie Simmering DO   On: 11/22/2020 09:21   CT Cervical Spine Wo Contrast  Result Date: 11/22/2020 CLINICAL DATA:  Fall.  Laceration to forehead and upper eyebrow. EXAM: CT HEAD WITHOUT CONTRAST CT CERVICAL SPINE WITHOUT CONTRAST TECHNIQUE: Multidetector CT imaging of the head and cervical spine was performed following the standard protocol without intravenous contrast. Multiplanar CT image reconstructions of the cervical spine were also generated. COMPARISON:  Brain MRI 12/13/2005. FINDINGS: CT HEAD FINDING: Brain: Generalized cerebral and cerebellar atrophy. Thin acute subdural hematoma overlying the anterior  right frontal lobe, measuring 3 mm in thickness. Additional small volume acute subarachnoid hemorrhage overlying the anterior right frontal lobe. Underlying right frontal lobe hemorrhagic parenchymal contusion cannot be excluded. Associated mass effect with 2 mm leftward midline shift measured at the level of the septum pellucidum. Advanced patchy and confluent hypoattenuation within the cerebral white matter, nonspecific but compatible with chronic small vessel ischemic disease. No demarcated cortical infarct. No extra-axial fluid collection. No evidence of intracranial mass. No midline shift. Vascular: No hyperdense vessel.  Atherosclerotic calcifications. Skull: Normal. Negative for fracture or focal lesion. Sinuses/Orbits: Visualized orbits show no acute finding. No significant paranasal sinus disease at the imaged levels. Other: Right forehead, periorbital and maxillofacial hematoma. CT CERVICAL SPINE FINDINGS Alignment: Trace C4-C5 grade 1 anterolisthesis. Skull base and vertebrae: The basion-dental and atlanto-dental intervals are maintained.No evidence of acute fracture to the cervical spine. Soft tissues and spinal canal: No prevertebral fluid or swelling. No visible canal hematoma. Disc  levels: Cervical spondylosis with multilevel disc space narrowing, disc bulges, posterior disc osteophytes, uncovertebral hypertrophy and facet arthrosis. Multilevel spinal canal stenosis. Most notably, a C5-C6 posterior disc osteophyte complex contributes to suspected mild/moderate spinal canal stenosis. Multilevel bony neural foraminal narrowing. Upper chest: No consolidation within the imaged lung apices. No visible pneumothorax. These results were called by telephone at the time of interpretation on 11/22/2020 at 9:20 am to provider ADAM CURATOLO , who verbally acknowledged these results. IMPRESSION: CT head: 1. Thin acute subdural hematoma overlying the anterior right frontal lobe. 2. Small volume acute subarachnoid  hemorrhage overlying the anterior right frontal lobe. 3. Underlying right frontal lobe hemorrhagic parenchymal contusion is difficult to exclude. 4. Mass effect with 2 mm leftward midline shift. 5. Right forehead, periorbital and maxillofacial hematoma. 6. Generalized atrophy with advanced cerebral white matter chronic small vessel ischemic disease. CT cervical spine: 1. No evidence of acute fracture to the cervical spine. 2. Trace C4-C5 grade 1 anterolisthesis. 3. Cervical spondylosis, as described. Electronically Signed   By: Kellie Simmering DO   On: 11/22/2020 09:21   DG Hand Complete Left  Result Date: 11/22/2020 CLINICAL DATA:  Status post fall. EXAM: LEFT HAND - COMPLETE 3+ VIEW COMPARISON:  None. FINDINGS: First [facility] joint degenerative changes. Oblique lucency at the base of the first metacarpal likely secondary to degenerative changes. The IP joint degenerative changes. Normal anatomic alignment. No evidence for acute fracture or dislocation. Regional soft tissues are unremarkable. IMPRESSION: There is an oblique lucency at the base of the first metacarpal likely secondary to degenerative changes. Recommend correlation for point tenderness to exclude the possibility of nondisplaced fracture. Electronically Signed   By: Lovey Newcomer M.D.   On: 11/22/2020 10:09   DG Hand Complete Right  Result Date: 11/22/2020 CLINICAL DATA:  87 year old male status post fall with pain. EXAM: RIGHT HAND - COMPLETE 3+ VIEW COMPARISON:  Right wrist series today. Right hand series 07/24/2010. FINDINGS: Distal radius and ulna fractures reported separately. Carpal bone alignment appears maintained. Metacarpals appear stable since 2012 and intact, probably with a remote 5th metacarpal fracture, mild volar angulation there. Second MCP joint space loss since that time. Phalanges appear intact. Normal distal joint spaces for age. IMPRESSION: 1. Distal radius and ulna fractures detailed on the wrist series. 2. No other acute fracture  or dislocation identified about the right hand. Electronically Signed   By: Genevie Ann M.D.   On: 11/22/2020 10:19   CT Maxillofacial Wo Contrast  Result Date: 11/22/2020 CLINICAL DATA:  87 year old male status post fall with pain. EXAM: CT MAXILLOFACIAL WITHOUT CONTRAST TECHNIQUE: Multidetector CT imaging of the maxillofacial structures was performed. Multiplanar CT image reconstructions were also generated. COMPARISON:  Head and cervical spine CT 0822 hours today. FINDINGS: Osseous: Absent dentition. Mandible intact and normally located. No maxilla or zygoma fracture. Pterygoid plates are intact. Central skull base appears intact. Visible calvarium intact. Orbits: No orbital wall fracture. Right forehead and moderate sized right periorbital and lateral face superficial hematoma, also visible on the earlier head CT. Globes and intraorbital soft tissues appears symmetric and normal. Sinuses: Clear throughout. Soft tissues: Negative visible noncontrast deep soft tissue spaces of the face. Limited intracranial: Mixed density right anterior subdural hematoma appears stable since 0822 hours. Stable ventricle size and configuration. Basilar cisterns remain normal. IMPRESSION: 1. No facial fracture identified. 2. Right forehead, periorbital, and lateral face superficial hematoma as seen on the earlier head CT. 3. Visible right side subdural hematoma is stable since 0822  hours. Electronically Signed   By: Genevie Ann M.D.   On: 11/22/2020 10:23    Imaging: reviewed  A/P: Tierre Netto is an 87 y.o. male  S/p ground level fall Right SDH SAH with midline shift Right forehead/facial abrasions/hematoma Right distal radius/ulna fracture HTN H/o TIA on aggrenox  Admit trauma ICU at [facility] Repeat head ct this pm Serial neuro exams Sz prophylaxis Home BP med EDP discussed case with NSG and [facility]- see his note - [facility] will see pt tomorrow Splint RUE No chemical vte prophylaxis due to  TBI Scds Ot/pt in am  Discussed with Dr Kieth Brightly Discussed with wife at [facility]. Redmond Pulling, MD, [place], Bariatric, & Minimally Invasive Surgery [facility], [place]   Leighton Ruff. Redmond Pulling, MD, FACS General, Bariatric, & Minimally Invasive Surgery [facility], [place]

## 2020-11-22 NOTE — ED Notes (Signed)
Patient transported to CT and X-ray via stretcher.

## 2020-11-22 NOTE — ED Notes (Signed)
[facility] called to request transport to [facility]

## 2020-11-22 NOTE — Consult Note (Signed)
Neurosurgery Consultation  Reason for Consult: Subdural hematoma Referring Physician: Ronnald Nian  CC: Left wrist pain / fall  HPI: This is a 87 y.o. man that presents with after a fall with R facial / head trauma and R wrist pain.Denies LOC, pt is on aggranox for TIAs. Today he does endorse some facial pain, minimal headache, and R wrist pain which is the worst. No other complaints. No new weakness, numbness, or parasthesias, but obviously limited at the R wrist. o recent use of anti-platelet or anti-coagulant medications aside from the aggranox.    ROS: A 14 point ROS was performed and is negative except as noted in the HPI.   PMHx:  Past Medical History:  Diagnosis Date   Anxiety and depression    Depression    Diverticulosis    Family history of coronary artery disease    GERD (gastroesophageal reflux disease)    History of arm fracture    Rt arm, LT elbow fracture   History of colon polyps    History of ETOH abuse    Quit in 1980   Hypercholesterolemia    Hypertension    PONV (postoperative nausea and vomiting)    TIA (transient ischemic attack) 12/2005   FamHx:  Family History  Problem Relation Age of Onset   Heart disease Father        Deceased 60 ( Had MI )   Heart disease Mother    SocHx:  reports that he has quit smoking. His smoking use included pipe. He has never used smokeless tobacco. He reports that he does not drink alcohol and does not use drugs.  Exam: Vital signs in last 24 hours: Temp:  [97.8 F (36.6 C)-98.6 F (37 C)] 97.8 F (36.6 C) (06/20 0400) Pulse Rate:  [57-77] 57 (06/20 0600) Resp:  [14-21] 21 (06/20 0600) BP: (100-144)/(51-82) 121/63 (06/20 0600) SpO2:  [92 %-100 %] 96 % (06/20 0600) Weight:  [68 kg] 68 kg (06/19 0756) General: Awake, alert, cooperative, lying in bed in NAD Head: Normocephalic, +R orbital hematoma / swelling, R forehead abrasion HEENT: Neck supple Pulmonary: breathing room air comfortably, no evidence of increased work  of breathing Cardiac: RRR Abdomen: S NT ND Extremities: Warm and well perfused x4, RUE casted Neuro: AOx3, PERRL, EOMI, FS Strength 5/5 x4, SILTx4, no drift on L   Assessment and Plan: 87 y.o. man s/p ground level fall. [place] personally reviewed, which shows right frontal subdural hematoma measuring 2-85mm in size with an associated 2-85mm of midline shift. Rpt CTH stable.   -no acute neurosurgical intervention indicated at this time, okay to transfer out of the unit from my perspective -hold aggranox for 1 week, okay for DVT chemoPPx on 6/21 -please call with any concerns or questions  Judith Part, MD 11/23/20 7:16 AM [facility]

## 2020-11-23 ENCOUNTER — Encounter (HOSPITAL_COMMUNITY): Payer: Self-pay

## 2020-11-23 LAB — CBC
HCT: 40.8 % (ref 39.0–52.0)
Hemoglobin: 14 g/dL (ref 13.0–17.0)
MCH: 31.6 pg (ref 26.0–34.0)
MCHC: 34.3 g/dL (ref 30.0–36.0)
MCV: 92.1 fL (ref 80.0–100.0)
Platelets: 194 10*3/uL (ref 150–400)
RBC: 4.43 MIL/uL (ref 4.22–5.81)
RDW: 12.9 % (ref 11.5–15.5)
WBC: 9.4 10*3/uL (ref 4.0–10.5)
nRBC: 0 % (ref 0.0–0.2)

## 2020-11-23 LAB — COMPREHENSIVE METABOLIC PANEL
ALT: 23 U/L (ref 0–44)
AST: 23 U/L (ref 15–41)
Albumin: 3.1 g/dL — ABNORMAL LOW (ref 3.5–5.0)
Alkaline Phosphatase: 49 U/L (ref 38–126)
Anion gap: 11 (ref 5–15)
BUN: 16 mg/dL (ref 8–23)
CO2: 28 mmol/L (ref 22–32)
Calcium: 8.8 mg/dL — ABNORMAL LOW (ref 8.9–10.3)
Chloride: 99 mmol/L (ref 98–111)
Creatinine, Ser: 1.09 mg/dL (ref 0.61–1.24)
GFR, Estimated: >60 mL/min (ref >60)
Glucose, Bld: 108 mg/dL — ABNORMAL HIGH (ref 70–99)
Potassium: 3.3 mmol/L — ABNORMAL LOW (ref 3.5–5.1)
Sodium: 138 mmol/L (ref 135–145)
Total Bilirubin: 1.1 mg/dL (ref 0.3–1.2)
Total Protein: 5.6 g/dL — ABNORMAL LOW (ref 6.5–8.1)

## 2020-11-23 LAB — MRSA NEXT GEN BY PCR, NASAL: MRSA by PCR Next Gen: NOT DETECTED

## 2020-11-23 MED ORDER — POTASSIUM CHLORIDE 20 MEQ PO PACK
40.0000 meq | PACK | Freq: Once | ORAL | Status: AC
  Administered 2020-11-23: 40 meq via ORAL
  Filled 2020-11-23: qty 2

## 2020-11-23 MED ORDER — POTASSIUM CHLORIDE 20 MEQ PO PACK
20.0000 meq | PACK | Freq: Once | ORAL | Status: AC
  Administered 2020-11-23: 20 meq via ORAL
  Filled 2020-11-23: qty 1

## 2020-11-23 NOTE — Evaluation (Signed)
Occupational Therapy Evaluation Patient Details Name: Peter Gallegos MRN: [id number] DOB: 1933/04/06 Today's Date: 11/23/2020    History of Present Illness The pt is an 87 yo male presenting 6/19 after a fall at home resulting in laceration to R forehead, R wrist deformity. Work-up revealed small R frontal SAH with 3 mm shift and R distal radius/ulna fx. PMH includes: anxiety/depression, GERD, HTN, TIA, and R arm fx.   Clinical Impression   Pt PTA: Pt living with spouse and reports independence with ADL and mobility. Pt currently, limited by decreased strength, decreased acitivity tolerance and decreased ability to care for self. Pt NWB on RUE until orders received per ortho. Pt mobility with minguardA to minA overall. Pt's cognition intact. Pt would benefit from continued OT skilled services for ADL, mobility and safety in OP OT setting (if needed for RUE) and acutely.    Follow Up Recommendations  Outpatient OT (for RUE when applicable)    Equipment Recommendations  None recommended by OT    Recommendations for Other Services       Precautions / Restrictions Precautions Precautions: Fall Required Braces or Orthoses: Splint/Cast Splint/Cast: R wrist Restrictions Weight Bearing Restrictions: Yes RUE Weight Bearing: Non weight bearing Other Position/Activity Restrictions: no orders in yet per ortho, maintained NWB through session      Mobility Bed Mobility Overal bed mobility: Needs Assistance Bed Mobility: Supine to Sit     Supine to sit: Min guard     General bed mobility comments: no assist given, able to complete with increased time and no use of RUE    Transfers Overall transfer level: Needs assistance Equipment used: 1 person hand held assist Transfers: Sit to/from Stand Sit to Stand: Min assist         General transfer comment: minA to power up and steady in standing. No overt LOB    Balance Overall balance assessment: Needs  assistance Sitting-balance support: No upper extremity supported;Feet supported Sitting balance-Leahy Scale: Normal     Standing balance support: Single extremity supported Standing balance-Leahy Scale: Poor Standing balance comment: reliant on at least single UE support                           ADL either performed or assessed with clinical judgement   ADL Overall ADL's : Needs assistance/impaired Eating/Feeding: Set up Eating/Feeding Details (indicate cue type and reason): LUE mostly Grooming: Min guard;Standing   Upper Body Bathing: Minimal assistance;Standing   Lower Body Bathing: Sitting/lateral leans;Sit to/from stand;Minimal assistance   Upper Body Dressing : Minimal assistance;Standing   Lower Body Dressing: Minimal assistance;Sitting/lateral leans;Sit to/from stand;Cueing for safety;Cueing for sequencing   Toilet Transfer: Min guard;Ambulation   Toileting- Clothing Manipulation and Hygiene: Minimal assistance;Sit to/from stand   Tub/ Shower Transfer: Min guard;Ambulation   Functional mobility during ADLs: Min guard;Cueing for safety General ADL Comments: Pt limited by decreased strength, decreased acitivity tolerance and decreased ability to care for self.     Vision Baseline Vision/History: No visual deficits Patient Visual Report: No change from baseline Vision Assessment?: No apparent visual deficits     Perception     Praxis      Pertinent Vitals/Pain Pain Assessment: Faces Faces Pain Scale: Hurts a little bit Pain Location: RUE Pain Descriptors / Indicators: Discomfort;Sore Pain Intervention(s): Limited activity within patient's tolerance;Monitored during session;Premedicated before session     Hand Dominance Right   Extremity/Trunk Assessment Upper Extremity Assessment Upper Extremity Assessment: RUE deficits/detail RUE  Deficits / Details: splinted elbow to finger tips RUE Coordination: decreased fine motor;decreased gross motor    Lower Extremity Assessment Lower Extremity Assessment: Generalized weakness   Cervical / Trunk Assessment Cervical / Trunk Assessment: Normal   Communication Communication Communication: No difficulties   Cognition Arousal/Alertness: Awake/alert Behavior During Therapy: WFL for tasks assessed/performed Overall Cognitive Status: Within Functional Limits for tasks assessed                                 General Comments: pt following all commands, slightly increased time to process but able to make safe decisions and ask for help appropriately. Pt passed Short Blessed Test with <4 errors representing normal cognition for memory, attention and sequencing.   General Comments  VSS through session, pt reporting nausea with mobility, given snacks as pt has not had any breakfast and had pain meds on empty stomach    Exercises     Shoulder Instructions      Home Living Family/patient expects to be discharged to:: Private residence Living Arrangements: Spouse/significant other Available Help at Discharge: Family;Available 24 hours/day Type of Home: House Home Access: Stairs to enter [employer] of Steps: 3 Entrance Stairs-Rails: Can reach both Home Layout: Two level Alternate Level Stairs-Number of Steps: full flight Alternate Level Stairs-Rails: Right Bathroom Shower/Tub: Teacher, early years/pre: Handicapped height     Home Equipment: Shower seat;Grab bars - toilet;Grab bars - tub/shower;Hand held shower head;Toilet riser;Walker - 2 wheels;Walker - 4 wheels;Cane - quad   Additional Comments: Pt able to sleep on futon on ML ; reports ML has a walk-in shower      Prior Functioning/Environment Level of Independence: Independent        Comments: reports fall 3 months ago off of a ladder fixing a lightbulb        OT Problem List: Decreased strength;Decreased activity tolerance;Decreased coordination;Pain      OT Treatment/Interventions:  Self-care/ADL training;Therapeutic exercise;Energy conservation;DME and/or AE instruction;Therapeutic activities;Patient/family education;Balance training    OT Goals(Current goals can be found in the care plan section) Acute Rehab OT Goals Patient Stated Goal: to go home to his wife OT Goal Formulation: With patient Time For Goal Achievement: 12/07/20 Potential to Achieve Goals: Good ADL Goals Pt Will Perform Upper Body Dressing: with min guard assist;sitting Pt Will Perform Lower Body Dressing: with min guard assist;sitting/lateral leans Pt/caregiver will Perform Home Exercise Program: Increased ROM;Right Upper extremity;With minimal assist;With written HEP provided Additional ADL Goal #1: Pt will increase to supervisionA for OOB ADL x10 mins with minimal cues for safety.  OT Frequency: Min 2X/week   Barriers to D/C:            Co-evaluation              AM-PAC OT "6 Clicks" Daily Activity     Outcome Measure Help from another person eating meals?: A Little Help from another person taking care of personal grooming?: A Little Help from another person toileting, which includes using toliet, bedpan, or urinal?: A Little Help from another person bathing (including washing, rinsing, drying)?: A Little Help from another person to put on and taking off regular upper body clothing?: A Little Help from another person to put on and taking off regular lower body clothing?: A Little 6 Click Score: 18   End of Session Equipment Utilized During Treatment: Gait belt Nurse Communication: Mobility status;Weight bearing status  Activity Tolerance: Patient  limited by pain Patient left: in chair;with call bell/phone within reach;with chair alarm set  OT Visit Diagnosis: Unsteadiness on feet (R26.81);Muscle weakness (generalized) (M62.81);Pain Pain - Right/Left: Right Pain - part of body: Arm;Hand                Time: 1025-1105 OT Time Calculation (min): 40 min Charges:  OT General  Charges $OT Visit: 1 Visit OT Evaluation $OT Eval Moderate Complexity: 1 Mod OT Treatments $Self Care/Home Management : 8-22 mins Jefferey Pica, OTR/L Acute Rehabilitation Services Pager: [phone] Office: [place] 11/23/2020, 9:22 PM

## 2020-11-23 NOTE — Evaluation (Signed)
Physical Therapy Evaluation Patient Details Name: Peter Gallegos MRN: [id number] DOB: 03-Nov-1932 Today's Date: 11/23/2020   History of Present Illness  The pt is an 87 yo male presenting 6/19 after a fall at home resulting in laceration to R forehead, R wrist deformity. Work-up revealed small R frontal SAH with 3 mm shift and R distal radius/ulna fx. PMH includes: anxiety/depression, GERD, HTN, TIA, and R arm fx.   Clinical Impression  Pt in bed upon arrival of PT, agreeable to evaluation at this time. Prior to admission the pt was completely independent, living in a home with 3 steps to enter with his wife who has recently retired and is now home 24/7. The pt now presents with limitations in functional mobility, power, dynamic stability, and activity tolerance due to above dx, and will continue to benefit from skilled PT to address these deficits. The pt was able to demo good bed mobility without use of RUE, but requires increased time and effort. He was then able to complete multiple sit-stand transfers from various surfaces with single UE support, and complete multiple short bouts of ambulation in the room with minA to steady, but was limited by further progression of mobility by onset of nausea. The pt will continue to benefit from skilled PT acutely as well as after d/c to address ongoing balance deficits (this is 2nd recent fall according to the pt).      Follow Up Recommendations Outpatient PT;Supervision for mobility/OOB (for balance)    Equipment Recommendations  None recommended by PT    Recommendations for Other Services       Precautions / Restrictions Precautions Precautions: Fall Required Braces or Orthoses: Splint/Cast Splint/Cast: R wrist Restrictions Weight Bearing Restrictions: Yes RUE Weight Bearing: Non weight bearing Other Position/Activity Restrictions: no orders in yet per ortho, maintained NWB through session      Mobility  Bed Mobility Overal bed  mobility: Needs Assistance Bed Mobility: Supine to Sit     Supine to sit: Min guard     General bed mobility comments: no assist given, able to complete with increased time and no use of RUE    Transfers Overall transfer level: Needs assistance Equipment used: 1 person hand held assist Transfers: Sit to/from Stand Sit to Stand: Min assist         General transfer comment: minA to power up and steady in standing. No overt LOB  Ambulation/Gait Ambulation/Gait assistance: Min assist Gait Distance (Feet): 15 Feet (+ 10) Assistive device: 1 person hand held assist Gait Pattern/deviations: Step-to pattern;Decreased stride length;Shuffle Gait velocity: decreased Gait velocity interpretation: <1.31 ft/sec, indicative of household ambulator General Gait Details: pt with small steps with minimal clearance, wide BOS, no overt LOB but pt needing at least single UE support   Modified Rankin (Stroke Patients Only) Modified Rankin (Stroke Patients Only) Pre-Morbid Rankin Score: No symptoms Modified Rankin: Moderately severe disability     Balance Overall balance assessment: Needs assistance Sitting-balance support: No upper extremity supported;Feet supported Sitting balance-Leahy Scale: Normal     Standing balance support: Single extremity supported Standing balance-Leahy Scale: Poor Standing balance comment: reliant on at least single UE support                             Pertinent Vitals/Pain Pain Assessment: Faces Faces Pain Scale: Hurts a little bit Pain Location: RUE Pain Descriptors / Indicators: Discomfort;Sore Pain Intervention(s): Limited activity within patient's tolerance;Monitored during session;Repositioned  Home Living Family/patient expects to be discharged to:: Private residence Living Arrangements: Spouse/significant other Available Help at Discharge: Family;Available 24 hours/day Type of Home: House Home Access: Stairs to enter Entrance  Stairs-Rails: Can reach both Entrance Stairs-Number of Steps: 3 Home Layout: Two level Home Equipment: Shower seat;Grab bars - toilet;Grab bars - tub/shower;Hand held shower head;Toilet riser;Walker - 2 wheels;Walker - 4 wheels;Cane - quad Additional Comments: Pt able to sleep on futon on ML ; reports ML has a walk-in shower    Prior Function Level of Independence: Independent         Comments: reports fall 3 months ago off of a ladder fixing a lightbulb     Hand Dominance   Dominant Hand: Right    Extremity/Trunk Assessment   Upper Extremity Assessment Upper Extremity Assessment: Defer to OT evaluation    Lower Extremity Assessment Lower Extremity Assessment: Overall WFL for tasks assessed    Cervical / Trunk Assessment Cervical / Trunk Assessment: Normal  Communication   Communication: No difficulties  Cognition Arousal/Alertness: Awake/alert Behavior During Therapy: WFL for tasks assessed/performed Overall Cognitive Status: Within Functional Limits for tasks assessed                                 General Comments: pt following all commands, slightly increased time to process but able to make safe decisions and ask for help appropriately      General Comments General comments (skin integrity, edema, etc.): VSS through session, pt reporting nausea with mobility, given snacks as pt has not had any breakfast and had pain meds on empty stomach    Exercises     Assessment/Plan    PT Assessment Patient needs continued PT services  PT Problem List Decreased activity tolerance;Decreased range of motion;Decreased balance;Decreased mobility       PT Treatment Interventions DME instruction;Gait training;Functional mobility training;Stair training;Therapeutic activities;Therapeutic exercise;Balance training;Patient/family education    PT Goals (Current goals can be found in the Care Plan section)  Acute Rehab PT Goals Patient Stated Goal: to go home to  his wife PT Goal Formulation: With patient Time For Goal Achievement: 12/07/20 Potential to Achieve Goals: Good    Frequency Min 3X/week    AM-PAC PT "6 Clicks" Mobility  Outcome Measure Help needed turning from your back to your side while in a flat bed without using bedrails?: A Little Help needed moving from lying on your back to sitting on the side of a flat bed without using bedrails?: A Little Help needed moving to and from a bed to a chair (including a wheelchair)?: A Little Help needed standing up from a chair using your arms (e.g., wheelchair or bedside chair)?: A Little Help needed to walk in hospital room?: A Little Help needed climbing 3-5 steps with a railing? : A Little 6 Click Score: 18    End of Session Equipment Utilized During Treatment: Gait belt Activity Tolerance: Patient tolerated treatment well;Treatment limited secondary to medical complications (Comment) (limited by nausea) Patient left: in chair;with chair alarm set;with call bell/phone within reach Nurse Communication: Mobility status PT Visit Diagnosis: Unsteadiness on feet (R26.81);Other abnormalities of gait and mobility (R26.89)    Time: 0347-4259 PT Time Calculation (min) (ACUTE ONLY): 35 min   Charges:   PT Evaluation $PT Eval Low Complexity: 1 Low          Karma Ganja, PT, DPT   Acute Rehabilitation Department Pager #: [phone]  Otho Bellows 11/23/2020, 11:24 AM

## 2020-11-23 NOTE — Progress Notes (Signed)
[facility] Progress Note     Subjective: CC:  RUE pain, improves with pain meds. Denies headache, nausea, vomiting. States he lives at home with his wife of over 55 years and they are independent of ADLs. No reported CP or urinary sxs.   AFVSS Objective: Vital signs in last 24 hours: Temp:  [97.8 F (36.6 C)-99.4 F (37.4 C)] 99.4 F (37.4 C) (06/20 0800) Pulse Rate:  [57-77] 57 (06/20 0800) Resp:  [14-21] 17 (06/20 0800) BP: (100-140)/(51-82) 126/63 (06/20 0800) SpO2:  [92 %-100 %] 96 % (06/20 0800) Last BM Date: 11/20/20  Intake/Output from previous day: 06/19 0701 - 06/20 0700 In: 815.2 [I.V.:615.2; IV Piggyback:200] Out: 400 [Urine:400] Intake/Output this shift: Total I/O In: 100.1 [I.V.:100.1] Out: -   PE: Gen:  Alert, NAD, pleasant and cooperative elderly male  HEENT: obvious facial trauma with right periorbital hematoma and edema. PERRL, EOMs in tact.  Card:  Regular rate and rhythm, pedal pulses 2+ BL, no peripheral edema Pulm:  Normal effort, clear to auscultation bilaterally Abd: Soft, non-tender, non-distended, bowel sounds present in all 4 quadrants, no HSM Skin: warm and dry, no rashes  MSK: RUE splinted, fingers with mild edema, sensation in tact, wiggles all fingers, cap refill <2 Psych: A&Ox3 (person, 2022, ICU at [facility])  Lab Results:  Recent Labs    11/22/20 0924 11/23/20 0435  WBC 11.2* 9.4  HGB 15.3 14.0  HCT 45.4 40.8  PLT 213 194   BMET Recent Labs    11/22/20 0924 11/23/20 0435  NA 138 138  K 3.6 3.3*  CL 100 99  CO2 32 28  GLUCOSE 105* 108*  BUN 22 16  CREATININE 1.14 1.09  CALCIUM 9.5 8.8*   PT/INR No results for input(s): LABPROT, INR in the last 72 hours. CMP     Component Value Date/Time   NA 138 11/23/2020 0435   K 3.3 (L) 11/23/2020 0435   CL 99 11/23/2020 0435   CO2 28 11/23/2020 0435   GLUCOSE 108 (H) 11/23/2020 0435   BUN 16 11/23/2020 0435   CREATININE 1.09 11/23/2020 0435   CALCIUM 8.8 (L)  11/23/2020 0435   PROT 5.6 (L) 11/23/2020 0435   ALBUMIN 3.1 (L) 11/23/2020 0435   AST 23 11/23/2020 0435   ALT 23 11/23/2020 0435   ALKPHOS 49 11/23/2020 0435   BILITOT 1.1 11/23/2020 0435   GFRNONAA >60 11/23/2020 0435   GFRAA >60 07/05/2019 1102   Lipase  No results found for: LIPASE     Studies/Results: DG Chest 2 View  Result Date: 11/22/2020 CLINICAL DATA:  Status post fall. EXAM: CHEST - 2 VIEW COMPARISON:  Chest radiograph 11/21/2013. FINDINGS: Stable cardiac and mediastinal contours. Low lung volumes. No large area pulmonary consolidation. No pleural effusion or pneumothorax. Thoracic spine degenerative changes. IMPRESSION: No acute cardiopulmonary process.  Low lung volumes. Electronically Signed   By: Lovey Newcomer M.D.   On: 11/22/2020 10:10   DG Pelvis 1-2 Views  Result Date: 11/22/2020 CLINICAL DATA:  87 year old male status post fall with pain. EXAM: PELVIS - 1-2 VIEW COMPARISON:  None. FINDINGS: Femoral heads are normally located. Grossly intact proximal femurs. No pelvis fracture identified. Questionable partial ankylosis of the SI joints, more so the right. Negative lower abdominal and pelvic visceral contours. IMPRESSION: No acute fracture or dislocation identified about the pelvis. Electronically Signed   By: Genevie Ann M.D.   On: 11/22/2020 10:17   DG Wrist Complete Right  Result Date: 11/22/2020 CLINICAL DATA:  87 year old male status post fall with pain. EXAM: RIGHT WRIST - COMPLETE 3+ VIEW COMPARISON:  Right hand series 07/24/2010. FINDINGS: Comminuted and impacted distal right radius fracture. Moderate dorsal impaction. Radiocarpal joint and DRU involvement. Mildly displaced ulnar styloid fracture. Carpal bone alignment is maintained. No carpal bone fracture identified. Metacarpals appear stable and intact. IMPRESSION: 1. Comminuted anddistal right radius fracture with moderate dorsal impaction. Radiocarpal and DRU involvement. 2. Mildly displaced ulnar styloid  fracture. Electronically Signed   By: Genevie Ann M.D.   On: 11/22/2020 10:17   CT Head Wo Contrast  Result Date: 11/22/2020 CLINICAL DATA:  Status post fall. EXAM: CT HEAD WITHOUT CONTRAST TECHNIQUE: Contiguous axial images were obtained from the base of the skull through the vertex without intravenous contrast. COMPARISON:  November 22, 2020 at 8:22 a.m. FINDINGS: Brain: Redemonstrated is acute subdural hematoma overlying the anterior right frontal lobe, stable in size measuring 3 mm in thickness. Additional small amount of subarachnoid hemorrhage in the anterior right frontal lobe is also stable. Persistent 2-3 mm leftward midline shift. Mild brain parenchymal volume loss and extensive deep white matter ischemic microangiopathy. Vascular: No hyperdense vessel or unexpected calcification. Skull: Normal. Negative for fracture or focal lesion. Sinuses/Orbits: No acute finding. Other: Right frontal periorbital and maxillofacial hematoma. IMPRESSION: 1. Stable size of the acute subdural hematoma overlying the anterior right frontal lobe with stable 2-3 mm leftward midline shift. 2. Stable small amount of subarachnoid hemorrhage in the anterior right frontal lobe. 3. Mild brain parenchymal volume loss and extensive deep white matter ischemic microangiopathy. 4. Right frontal periorbital and maxillofacial hematoma. Electronically Signed   By: Fidela Salisbury M.D.   On: 11/22/2020 16:09   CT Head Wo Contrast  Result Date: 11/22/2020 CLINICAL DATA:  Fall.  Laceration to forehead and upper eyebrow. EXAM: CT HEAD WITHOUT CONTRAST CT CERVICAL SPINE WITHOUT CONTRAST TECHNIQUE: Multidetector CT imaging of the head and cervical spine was performed following the standard protocol without intravenous contrast. Multiplanar CT image reconstructions of the cervical spine were also generated. COMPARISON:  Brain MRI 12/13/2005. FINDINGS: CT HEAD FINDING: Brain: Generalized cerebral and cerebellar atrophy. Thin acute subdural  hematoma overlying the anterior right frontal lobe, measuring 3 mm in thickness. Additional small volume acute subarachnoid hemorrhage overlying the anterior right frontal lobe. Underlying right frontal lobe hemorrhagic parenchymal contusion cannot be excluded. Associated mass effect with 2 mm leftward midline shift measured at the level of the septum pellucidum. Advanced patchy and confluent hypoattenuation within the cerebral white matter, nonspecific but compatible with chronic small vessel ischemic disease. No demarcated cortical infarct. No extra-axial fluid collection. No evidence of intracranial mass. No midline shift. Vascular: No hyperdense vessel.  Atherosclerotic calcifications. Skull: Normal. Negative for fracture or focal lesion. Sinuses/Orbits: Visualized orbits show no acute finding. No significant paranasal sinus disease at the imaged levels. Other: Right forehead, periorbital and maxillofacial hematoma. CT CERVICAL SPINE FINDINGS Alignment: Trace C4-C5 grade 1 anterolisthesis. Skull base and vertebrae: The basion-dental and atlanto-dental intervals are maintained.No evidence of acute fracture to the cervical spine. Soft tissues and spinal canal: No prevertebral fluid or swelling. No visible canal hematoma. Disc levels: Cervical spondylosis with multilevel disc space narrowing, disc bulges, posterior disc osteophytes, uncovertebral hypertrophy and facet arthrosis. Multilevel spinal canal stenosis. Most notably, a C5-C6 posterior disc osteophyte complex contributes to suspected mild/moderate spinal canal stenosis. Multilevel bony neural foraminal narrowing. Upper chest: No consolidation within the imaged lung apices. No visible pneumothorax. These results were called by telephone at the time of  interpretation on 11/22/2020 at 9:20 am to provider ADAM CURATOLO , who verbally acknowledged these results. IMPRESSION: CT head: 1. Thin acute subdural hematoma overlying the anterior right frontal lobe. 2.  Small volume acute subarachnoid hemorrhage overlying the anterior right frontal lobe. 3. Underlying right frontal lobe hemorrhagic parenchymal contusion is difficult to exclude. 4. Mass effect with 2 mm leftward midline shift. 5. Right forehead, periorbital and maxillofacial hematoma. 6. Generalized atrophy with advanced cerebral white matter chronic small vessel ischemic disease. CT cervical spine: 1. No evidence of acute fracture to the cervical spine. 2. Trace C4-C5 grade 1 anterolisthesis. 3. Cervical spondylosis, as described. Electronically Signed   By: Kellie Simmering DO   On: 11/22/2020 09:21   CT Cervical Spine Wo Contrast  Result Date: 11/22/2020 CLINICAL DATA:  Fall.  Laceration to forehead and upper eyebrow. EXAM: CT HEAD WITHOUT CONTRAST CT CERVICAL SPINE WITHOUT CONTRAST TECHNIQUE: Multidetector CT imaging of the head and cervical spine was performed following the standard protocol without intravenous contrast. Multiplanar CT image reconstructions of the cervical spine were also generated. COMPARISON:  Brain MRI 12/13/2005. FINDINGS: CT HEAD FINDING: Brain: Generalized cerebral and cerebellar atrophy. Thin acute subdural hematoma overlying the anterior right frontal lobe, measuring 3 mm in thickness. Additional small volume acute subarachnoid hemorrhage overlying the anterior right frontal lobe. Underlying right frontal lobe hemorrhagic parenchymal contusion cannot be excluded. Associated mass effect with 2 mm leftward midline shift measured at the level of the septum pellucidum. Advanced patchy and confluent hypoattenuation within the cerebral white matter, nonspecific but compatible with chronic small vessel ischemic disease. No demarcated cortical infarct. No extra-axial fluid collection. No evidence of intracranial mass. No midline shift. Vascular: No hyperdense vessel.  Atherosclerotic calcifications. Skull: Normal. Negative for fracture or focal lesion. Sinuses/Orbits: Visualized orbits show no  acute finding. No significant paranasal sinus disease at the imaged levels. Other: Right forehead, periorbital and maxillofacial hematoma. CT CERVICAL SPINE FINDINGS Alignment: Trace C4-C5 grade 1 anterolisthesis. Skull base and vertebrae: The basion-dental and atlanto-dental intervals are maintained.No evidence of acute fracture to the cervical spine. Soft tissues and spinal canal: No prevertebral fluid or swelling. No visible canal hematoma. Disc levels: Cervical spondylosis with multilevel disc space narrowing, disc bulges, posterior disc osteophytes, uncovertebral hypertrophy and facet arthrosis. Multilevel spinal canal stenosis. Most notably, a C5-C6 posterior disc osteophyte complex contributes to suspected mild/moderate spinal canal stenosis. Multilevel bony neural foraminal narrowing. Upper chest: No consolidation within the imaged lung apices. No visible pneumothorax. These results were called by telephone at the time of interpretation on 11/22/2020 at 9:20 am to provider ADAM CURATOLO , who verbally acknowledged these results. IMPRESSION: CT head: 1. Thin acute subdural hematoma overlying the anterior right frontal lobe. 2. Small volume acute subarachnoid hemorrhage overlying the anterior right frontal lobe. 3. Underlying right frontal lobe hemorrhagic parenchymal contusion is difficult to exclude. 4. Mass effect with 2 mm leftward midline shift. 5. Right forehead, periorbital and maxillofacial hematoma. 6. Generalized atrophy with advanced cerebral white matter chronic small vessel ischemic disease. CT cervical spine: 1. No evidence of acute fracture to the cervical spine. 2. Trace C4-C5 grade 1 anterolisthesis. 3. Cervical spondylosis, as described. Electronically Signed   By: Kellie Simmering DO   On: 11/22/2020 09:21   DG Hand Complete Left  Result Date: 11/22/2020 CLINICAL DATA:  Status post fall. EXAM: LEFT HAND - COMPLETE 3+ VIEW COMPARISON:  None. FINDINGS: First [facility] joint degenerative changes.  Oblique lucency at the base of the first metacarpal likely secondary  to degenerative changes. The IP joint degenerative changes. Normal anatomic alignment. No evidence for acute fracture or dislocation. Regional soft tissues are unremarkable. IMPRESSION: There is an oblique lucency at the base of the first metacarpal likely secondary to degenerative changes. Recommend correlation for point tenderness to exclude the possibility of nondisplaced fracture. Electronically Signed   By: Lovey Newcomer M.D.   On: 11/22/2020 10:09   DG Hand Complete Right  Result Date: 11/22/2020 CLINICAL DATA:  87 year old male status post fall with pain. EXAM: RIGHT HAND - COMPLETE 3+ VIEW COMPARISON:  Right wrist series today. Right hand series 07/24/2010. FINDINGS: Distal radius and ulna fractures reported separately. Carpal bone alignment appears maintained. Metacarpals appear stable since 2012 and intact, probably with a remote 5th metacarpal fracture, mild volar angulation there. Second MCP joint space loss since that time. Phalanges appear intact. Normal distal joint spaces for age. IMPRESSION: 1. Distal radius and ulna fractures detailed on the wrist series. 2. No other acute fracture or dislocation identified about the right hand. Electronically Signed   By: Genevie Ann M.D.   On: 11/22/2020 10:19   CT Maxillofacial Wo Contrast  Result Date: 11/22/2020 CLINICAL DATA:  87 year old male status post fall with pain. EXAM: CT MAXILLOFACIAL WITHOUT CONTRAST TECHNIQUE: Multidetector CT imaging of the maxillofacial structures was performed. Multiplanar CT image reconstructions were also generated. COMPARISON:  Head and cervical spine CT 0822 hours today. FINDINGS: Osseous: Absent dentition. Mandible intact and normally located. No maxilla or zygoma fracture. Pterygoid plates are intact. Central skull base appears intact. Visible calvarium intact. Orbits: No orbital wall fracture. Right forehead and moderate sized right periorbital and  lateral face superficial hematoma, also visible on the earlier head CT. Globes and intraorbital soft tissues appears symmetric and normal. Sinuses: Clear throughout. Soft tissues: Negative visible noncontrast deep soft tissue spaces of the face. Limited intracranial: Mixed density right anterior subdural hematoma appears stable since 0822 hours. Stable ventricle size and configuration. Basilar cisterns remain normal. IMPRESSION: 1. No facial fracture identified. 2. Right forehead, periorbital, and lateral face superficial hematoma as seen on the earlier head CT. 3. Visible right side subdural hematoma is stable since 0822 hours. Electronically Signed   By: Genevie Ann M.D.   On: 11/22/2020 10:23    Anti-infectives: Anti-infectives (From admission, onward)    None      Assessment/Plan 87 y/o M s/p ground level fall  6/19 0600  Right SDH, SAH with midline shift - repeat head CT head yesterday at 1609 stable SDH w/ 2-3 mm left shift, stable SAH anterior right frontal lobe. NS consulted (Ostergard) w/ no acute NS  intervention recommended. Keppra for seizure prophylaxis. Right forehead/facial abrasions/hematoma Right distal radius/ulna fracture - EDP ordered sugar-tong splint, pt requesting [facility] as he knows Dr. Amedeo Plenty. Ortho to see today. HTN H/o TIA on aggrenox - hold aggrenox x 1 week per NS   FEN: IVF @ 50 cc/hr. diet heart healthy, hypokalemia 3.3 - replete with 60 mEq PO KCl  ID:  VTE: SCDs, chemical VTE held 2/2 intracranial bleed, ok to start chemical VTE ppx tomorrow 6/21 per NS.  Dispo: transfer to [facility], PT/OT/SLP, await ortho recs      LOS: 1 day    Obie Dredge, [facility] Please see Amion for pager number during day hours 7:00am-4:30pm

## 2020-11-23 NOTE — Consult Note (Addendum)
Reason for Consult:Right wrist fx Referring Physician: Greer Pickerel Time called: [id number] Time at bedside: Peter Gallegos is an 87 y.o. male.  HPI: Peter Gallegos tripped over a chair on his patio and fell onto the concrete. He hurt his right wrist mainly but also hit his head. He was brought to the ED and workup showed a right wrist fracture. He was admitted by the trauma service and orthopedic surgery was consulted. He is RHD and lives at home with his wife.  Past Medical History:  Diagnosis Date   Anxiety and depression    Depression    Diverticulosis    Family history of coronary artery disease    GERD (gastroesophageal reflux disease)    History of arm fracture    Rt arm, LT elbow fracture   History of colon polyps    History of ETOH abuse    Quit in 1980   Hypercholesterolemia    Hypertension    PONV (postoperative nausea and vomiting)    TIA (transient ischemic attack) 12/2005    Past Surgical History:  Procedure Laterality Date   COLONOSCOPY     OPEN REDUCTION INTERNAL FIXATION (ORIF) DISTAL PHALANX Left 11/21/2013   Procedure: OPEN REDUCTION INTERNAL FIXATION (ORIF) DISTAL PHALANX;  Surgeon: Linna Hoff, MD;  Location: [place];  Service: Orthopedics;  Laterality: Left;    Family History  Problem Relation Age of Onset   Heart disease Father        Deceased 65 ( Had MI )   Heart disease Mother     Social History:  reports that he has quit smoking. His smoking use included pipe. He has never used smokeless tobacco. He reports that he does not drink alcohol and does not use drugs.  Allergies: No Known Allergies  Medications: I have reviewed the patient's current medications.  Results for orders placed or performed during the hospital encounter of 11/22/20 (from the past 48 hour(s))  CBC with Differential     Status: Abnormal   Collection Time: 11/22/20  9:24 AM  Result Value Ref Range   WBC 11.2 (H) 4.0 - 10.5 K/uL   RBC 4.87 4.22 - 5.81 MIL/uL   Hemoglobin  15.3 13.0 - 17.0 g/dL   HCT 45.4 39.0 - 52.0 %   MCV 93.2 80.0 - 100.0 fL   MCH 31.4 26.0 - 34.0 pg   MCHC 33.7 30.0 - 36.0 g/dL   RDW 12.9 11.5 - 15.5 %   Platelets 213 150 - 400 K/uL   nRBC 0.0 0.0 - 0.2 %   Neutrophils Relative % 76 %   Neutro Abs 8.6 (H) 1.7 - 7.7 K/uL   Lymphocytes Relative 16 %   Lymphs Abs 1.7 0.7 - 4.0 K/uL   Monocytes Relative 6 %   Monocytes Absolute 0.6 0.1 - 1.0 K/uL   Eosinophils Relative 1 %   Eosinophils Absolute 0.1 0.0 - 0.5 K/uL   Basophils Relative 0 %   Basophils Absolute 0.0 0.0 - 0.1 K/uL   Immature Granulocytes 1 %   Abs Immature Granulocytes 0.06 0.00 - 0.07 K/uL    Comment: Performed at [facility], [place] [address], [place], [place] [address]  Basic metabolic panel     Status: Abnormal   Collection Time: 11/22/20  9:24 AM  Result Value Ref Range   Sodium 138 135 - 145 mmol/L   Potassium 3.6 3.5 - 5.1 mmol/L   Chloride 100 98 - 111 mmol/L   CO2  32 22 - 32 mmol/L   Glucose, Bld 105 (H) 70 - 99 mg/dL    Comment: Glucose reference range applies only to samples taken after fasting for at least 8 hours.   BUN 22 8 - 23 mg/dL   Creatinine, Ser 1.14 0.61 - 1.24 mg/dL   Calcium 9.5 8.9 - 10.3 mg/dL   GFR, Estimated >60 >60 mL/min    Comment: (NOTE) Calculated using the CKD-EPI Creatinine Equation (2021)    Anion gap 6 5 - 15    Comment: Performed at [facility], [place] [address], [place], [place] [address]  Resp Panel by RT-PCR (Flu A&B, Covid) Nasopharyngeal Swab     Status: None   Collection Time: 11/22/20  9:25 AM   Specimen: Nasopharyngeal Swab; Nasopharyngeal(NP) swabs in vial transport medium  Result Value Ref Range   SARS Coronavirus 2 by RT PCR NEGATIVE NEGATIVE    Comment: (NOTE) SARS-CoV-2 target nucleic acids are NOT DETECTED.  The SARS-CoV-2 RNA is generally detectable in upper respiratory specimens during the acute phase of infection. The lowest concentration of SARS-CoV-2 viral copies  this assay can detect is 138 copies/mL. A negative result does not preclude SARS-Cov-2 infection and should not be used as the sole basis for treatment or other patient management decisions. A negative result may occur with  improper specimen collection/handling, submission of specimen other than nasopharyngeal swab, presence of viral mutation(s) within the areas targeted by this assay, and inadequate number of viral copies(<138 copies/mL). A negative result must be combined with clinical observations, patient history, and epidemiological information. The expected result is Negative.  Fact Sheet for Patients:  EntrepreneurPulse.com.au  Fact Sheet for Healthcare Providers:  IncredibleEmployment.be  This test is no t yet approved or cleared by the [place] FDA and  has been authorized for detection and/or diagnosis of SARS-CoV-2 by FDA under an Emergency Use Authorization (EUA). This EUA will remain  in effect (meaning this test can be used) for the duration of the COVID-19 declaration under Section 564(b)(1) of the Act, 21 U.S.C.section 360bbb-3(b)(1), unless the authorization is terminated  or revoked sooner.       Influenza A by PCR NEGATIVE NEGATIVE   Influenza B by PCR NEGATIVE NEGATIVE    Comment: (NOTE) The Xpert Xpress SARS-CoV-2/FLU/RSV plus assay is intended as an aid in the diagnosis of influenza from Nasopharyngeal swab specimens and should not be used as a sole basis for treatment. Nasal washings and aspirates are unacceptable for Xpert Xpress SARS-CoV-2/FLU/RSV testing.  Fact Sheet for Patients: EntrepreneurPulse.com.au  Fact Sheet for Healthcare Providers: IncredibleEmployment.be  This test is not yet approved or cleared by the [place] FDA and has been authorized for detection and/or diagnosis of SARS-CoV-2 by FDA under an Emergency Use Authorization (EUA). This EUA will  remain in effect (meaning this test can be used) for the duration of the COVID-19 declaration under Section 564(b)(1) of the Act, 21 U.S.C. section 360bbb-3(b)(1), unless the authorization is terminated or revoked.  Performed at [facility], [place] [address] [place], [place] [address]   MRSA Next Gen by PCR, Nasal     Status: None   Collection Time: 11/22/20  8:23 PM   Specimen: Nasal Mucosa; Nasal Swab  Result Value Ref Range   MRSA by PCR Next Gen NOT DETECTED NOT DETECTED    Comment: (NOTE) The GeneXpert MRSA Assay (FDA approved for NASAL specimens only), is one component of a comprehensive MRSA colonization surveillance program. It is not intended  to diagnose MRSA infection nor to guide or monitor treatment for MRSA infections. Test performance is not FDA approved in patients less than 65 years old. Performed at [facility], [place] [address], [place] [address]   CBC     Status: None   Collection Time: 11/23/20  4:35 AM  Result Value Ref Range   WBC 9.4 4.0 - 10.5 K/uL   RBC 4.43 4.22 - 5.81 MIL/uL   Hemoglobin 14.0 13.0 - 17.0 g/dL   HCT 40.8 39.0 - 52.0 %   MCV 92.1 80.0 - 100.0 fL   MCH 31.6 26.0 - 34.0 pg   MCHC 34.3 30.0 - 36.0 g/dL   RDW 12.9 11.5 - 15.5 %   Platelets 194 150 - 400 K/uL   nRBC 0.0 0.0 - 0.2 %    Comment: Performed at [facility], [place] [address], [place], [place] [address]  Comprehensive metabolic panel     Status: Abnormal   Collection Time: 11/23/20  4:35 AM  Result Value Ref Range   Sodium 138 135 - 145 mmol/L   Potassium 3.3 (L) 3.5 - 5.1 mmol/L   Chloride 99 98 - 111 mmol/L   CO2 28 22 - 32 mmol/L   Glucose, Bld 108 (H) 70 - 99 mg/dL    Comment: Glucose reference range applies only to samples taken after fasting for at least 8 hours.   BUN 16 8 - 23 mg/dL   Creatinine, Ser 1.09 0.61 - 1.24 mg/dL   Calcium 8.8 (L) 8.9 - 10.3 mg/dL   Total Protein 5.6 (L) 6.5 - 8.1 g/dL   Albumin 3.1 (L) 3.5 - 5.0  g/dL   AST 23 15 - 41 U/L   ALT 23 0 - 44 U/L   Alkaline Phosphatase 49 38 - 126 U/L   Total Bilirubin 1.1 0.3 - 1.2 mg/dL   GFR, Estimated >60 >60 mL/min    Comment: (NOTE) Calculated using the CKD-EPI Creatinine Equation (2021)    Anion gap 11 5 - 15    Comment: Performed at [facility], [place] [address], [place], [place] [address]    DG Chest 2 View  Result Date: 11/22/2020 CLINICAL DATA:  Status post fall. EXAM: CHEST - 2 VIEW COMPARISON:  Chest radiograph 11/21/2013. FINDINGS: Stable cardiac and mediastinal contours. Low lung volumes. No large area pulmonary consolidation. No pleural effusion or pneumothorax. Thoracic spine degenerative changes. IMPRESSION: No acute cardiopulmonary process.  Low lung volumes. Electronically Signed   By: Lovey Newcomer M.D.   On: 11/22/2020 10:10   DG Pelvis 1-2 Views  Result Date: 11/22/2020 CLINICAL DATA:  87 year old male status post fall with pain. EXAM: PELVIS - 1-2 VIEW COMPARISON:  None. FINDINGS: Femoral heads are normally located. Grossly intact proximal femurs. No pelvis fracture identified. Questionable partial ankylosis of the SI joints, more so the right. Negative lower abdominal and pelvic visceral contours. IMPRESSION: No acute fracture or dislocation identified about the pelvis. Electronically Signed   By: Genevie Ann M.D.   On: 11/22/2020 10:17   DG Wrist Complete Right  Result Date: 11/22/2020 CLINICAL DATA:  87 year old male status post fall with pain. EXAM: RIGHT WRIST - COMPLETE 3+ VIEW COMPARISON:  Right hand series 07/24/2010. FINDINGS: Comminuted and impacted distal right radius fracture. Moderate dorsal impaction. Radiocarpal joint and DRU involvement. Mildly displaced ulnar styloid fracture. Carpal bone alignment is maintained. No carpal bone fracture identified. Metacarpals appear stable and intact. IMPRESSION: 1. Comminuted anddistal right radius  fracture with moderate dorsal impaction. Radiocarpal and DRU involvement. 2. Mildly  displaced ulnar styloid fracture. Electronically Signed   By: Genevie Ann M.D.   On: 11/22/2020 10:17   CT Head Wo Contrast  Result Date: 11/22/2020 CLINICAL DATA:  Status post fall. EXAM: CT HEAD WITHOUT CONTRAST TECHNIQUE: Contiguous axial images were obtained from the base of the skull through the vertex without intravenous contrast. COMPARISON:  November 22, 2020 at 8:22 a.m. FINDINGS: Brain: Redemonstrated is acute subdural hematoma overlying the anterior right frontal lobe, stable in size measuring 3 mm in thickness. Additional small amount of subarachnoid hemorrhage in the anterior right frontal lobe is also stable. Persistent 2-3 mm leftward midline shift. Mild brain parenchymal volume loss and extensive deep white matter ischemic microangiopathy. Vascular: No hyperdense vessel or unexpected calcification. Skull: Normal. Negative for fracture or focal lesion. Sinuses/Orbits: No acute finding. Other: Right frontal periorbital and maxillofacial hematoma. IMPRESSION: 1. Stable size of the acute subdural hematoma overlying the anterior right frontal lobe with stable 2-3 mm leftward midline shift. 2. Stable small amount of subarachnoid hemorrhage in the anterior right frontal lobe. 3. Mild brain parenchymal volume loss and extensive deep white matter ischemic microangiopathy. 4. Right frontal periorbital and maxillofacial hematoma. Electronically Signed   By: Fidela Salisbury M.D.   On: 11/22/2020 16:09   CT Head Wo Contrast  Result Date: 11/22/2020 CLINICAL DATA:  Fall.  Laceration to forehead and upper eyebrow. EXAM: CT HEAD WITHOUT CONTRAST CT CERVICAL SPINE WITHOUT CONTRAST TECHNIQUE: Multidetector CT imaging of the head and cervical spine was performed following the standard protocol without intravenous contrast. Multiplanar CT image reconstructions of the cervical spine were also generated. COMPARISON:  Brain MRI 12/13/2005. FINDINGS: CT HEAD FINDING: Brain: Generalized cerebral and cerebellar atrophy.  Thin acute subdural hematoma overlying the anterior right frontal lobe, measuring 3 mm in thickness. Additional small volume acute subarachnoid hemorrhage overlying the anterior right frontal lobe. Underlying right frontal lobe hemorrhagic parenchymal contusion cannot be excluded. Associated mass effect with 2 mm leftward midline shift measured at the level of the septum pellucidum. Advanced patchy and confluent hypoattenuation within the cerebral white matter, nonspecific but compatible with chronic small vessel ischemic disease. No demarcated cortical infarct. No extra-axial fluid collection. No evidence of intracranial mass. No midline shift. Vascular: No hyperdense vessel.  Atherosclerotic calcifications. Skull: Normal. Negative for fracture or focal lesion. Sinuses/Orbits: Visualized orbits show no acute finding. No significant paranasal sinus disease at the imaged levels. Other: Right forehead, periorbital and maxillofacial hematoma. CT CERVICAL SPINE FINDINGS Alignment: Trace C4-C5 grade 1 anterolisthesis. Skull base and vertebrae: The basion-dental and atlanto-dental intervals are maintained.No evidence of acute fracture to the cervical spine. Soft tissues and spinal canal: No prevertebral fluid or swelling. No visible canal hematoma. Disc levels: Cervical spondylosis with multilevel disc space narrowing, disc bulges, posterior disc osteophytes, uncovertebral hypertrophy and facet arthrosis. Multilevel spinal canal stenosis. Most notably, a C5-C6 posterior disc osteophyte complex contributes to suspected mild/moderate spinal canal stenosis. Multilevel bony neural foraminal narrowing. Upper chest: No consolidation within the imaged lung apices. No visible pneumothorax. These results were called by telephone at the time of interpretation on 11/22/2020 at 9:20 am to provider ADAM CURATOLO , who verbally acknowledged these results. IMPRESSION: CT head: 1. Thin acute subdural hematoma overlying the anterior right  frontal lobe. 2. Small volume acute subarachnoid hemorrhage overlying the anterior right frontal lobe. 3. Underlying right frontal lobe hemorrhagic parenchymal contusion is difficult to exclude. 4. Mass effect with 2 mm leftward  midline shift. 5. Right forehead, periorbital and maxillofacial hematoma. 6. Generalized atrophy with advanced cerebral white matter chronic small vessel ischemic disease. CT cervical spine: 1. No evidence of acute fracture to the cervical spine. 2. Trace C4-C5 grade 1 anterolisthesis. 3. Cervical spondylosis, as described. Electronically Signed   By: Kellie Simmering DO   On: 11/22/2020 09:21   CT Cervical Spine Wo Contrast  Result Date: 11/22/2020 CLINICAL DATA:  Fall.  Laceration to forehead and upper eyebrow. EXAM: CT HEAD WITHOUT CONTRAST CT CERVICAL SPINE WITHOUT CONTRAST TECHNIQUE: Multidetector CT imaging of the head and cervical spine was performed following the standard protocol without intravenous contrast. Multiplanar CT image reconstructions of the cervical spine were also generated. COMPARISON:  Brain MRI 12/13/2005. FINDINGS: CT HEAD FINDING: Brain: Generalized cerebral and cerebellar atrophy. Thin acute subdural hematoma overlying the anterior right frontal lobe, measuring 3 mm in thickness. Additional small volume acute subarachnoid hemorrhage overlying the anterior right frontal lobe. Underlying right frontal lobe hemorrhagic parenchymal contusion cannot be excluded. Associated mass effect with 2 mm leftward midline shift measured at the level of the septum pellucidum. Advanced patchy and confluent hypoattenuation within the cerebral white matter, nonspecific but compatible with chronic small vessel ischemic disease. No demarcated cortical infarct. No extra-axial fluid collection. No evidence of intracranial mass. No midline shift. Vascular: No hyperdense vessel.  Atherosclerotic calcifications. Skull: Normal. Negative for fracture or focal lesion. Sinuses/Orbits:  Visualized orbits show no acute finding. No significant paranasal sinus disease at the imaged levels. Other: Right forehead, periorbital and maxillofacial hematoma. CT CERVICAL SPINE FINDINGS Alignment: Trace C4-C5 grade 1 anterolisthesis. Skull base and vertebrae: The basion-dental and atlanto-dental intervals are maintained.No evidence of acute fracture to the cervical spine. Soft tissues and spinal canal: No prevertebral fluid or swelling. No visible canal hematoma. Disc levels: Cervical spondylosis with multilevel disc space narrowing, disc bulges, posterior disc osteophytes, uncovertebral hypertrophy and facet arthrosis. Multilevel spinal canal stenosis. Most notably, a C5-C6 posterior disc osteophyte complex contributes to suspected mild/moderate spinal canal stenosis. Multilevel bony neural foraminal narrowing. Upper chest: No consolidation within the imaged lung apices. No visible pneumothorax. These results were called by telephone at the time of interpretation on 11/22/2020 at 9:20 am to provider ADAM CURATOLO , who verbally acknowledged these results. IMPRESSION: CT head: 1. Thin acute subdural hematoma overlying the anterior right frontal lobe. 2. Small volume acute subarachnoid hemorrhage overlying the anterior right frontal lobe. 3. Underlying right frontal lobe hemorrhagic parenchymal contusion is difficult to exclude. 4. Mass effect with 2 mm leftward midline shift. 5. Right forehead, periorbital and maxillofacial hematoma. 6. Generalized atrophy with advanced cerebral white matter chronic small vessel ischemic disease. CT cervical spine: 1. No evidence of acute fracture to the cervical spine. 2. Trace C4-C5 grade 1 anterolisthesis. 3. Cervical spondylosis, as described. Electronically Signed   By: Kellie Simmering DO   On: 11/22/2020 09:21   DG Hand Complete Left  Result Date: 11/22/2020 CLINICAL DATA:  Status post fall. EXAM: LEFT HAND - COMPLETE 3+ VIEW COMPARISON:  None. FINDINGS: First [facility] joint  degenerative changes. Oblique lucency at the base of the first metacarpal likely secondary to degenerative changes. The IP joint degenerative changes. Normal anatomic alignment. No evidence for acute fracture or dislocation. Regional soft tissues are unremarkable. IMPRESSION: There is an oblique lucency at the base of the first metacarpal likely secondary to degenerative changes. Recommend correlation for point tenderness to exclude the possibility of nondisplaced fracture. Electronically Signed   By: Polly Cobia.D.  On: 11/22/2020 10:09   DG Hand Complete Right  Result Date: 11/22/2020 CLINICAL DATA:  87 year old male status post fall with pain. EXAM: RIGHT HAND - COMPLETE 3+ VIEW COMPARISON:  Right wrist series today. Right hand series 07/24/2010. FINDINGS: Distal radius and ulna fractures reported separately. Carpal bone alignment appears maintained. Metacarpals appear stable since 2012 and intact, probably with a remote 5th metacarpal fracture, mild volar angulation there. Second MCP joint space loss since that time. Phalanges appear intact. Normal distal joint spaces for age. IMPRESSION: 1. Distal radius and ulna fractures detailed on the wrist series. 2. No other acute fracture or dislocation identified about the right hand. Electronically Signed   By: Genevie Ann M.D.   On: 11/22/2020 10:19   CT Maxillofacial Wo Contrast  Result Date: 11/22/2020 CLINICAL DATA:  87 year old male status post fall with pain. EXAM: CT MAXILLOFACIAL WITHOUT CONTRAST TECHNIQUE: Multidetector CT imaging of the maxillofacial structures was performed. Multiplanar CT image reconstructions were also generated. COMPARISON:  Head and cervical spine CT 0822 hours today. FINDINGS: Osseous: Absent dentition. Mandible intact and normally located. No maxilla or zygoma fracture. Pterygoid plates are intact. Central skull base appears intact. Visible calvarium intact. Orbits: No orbital wall fracture. Right forehead and moderate sized  right periorbital and lateral face superficial hematoma, also visible on the earlier head CT. Globes and intraorbital soft tissues appears symmetric and normal. Sinuses: Clear throughout. Soft tissues: Negative visible noncontrast deep soft tissue spaces of the face. Limited intracranial: Mixed density right anterior subdural hematoma appears stable since 0822 hours. Stable ventricle size and configuration. Basilar cisterns remain normal. IMPRESSION: 1. No facial fracture identified. 2. Right forehead, periorbital, and lateral face superficial hematoma as seen on the earlier head CT. 3. Visible right side subdural hematoma is stable since 0822 hours. Electronically Signed   By: Genevie Ann M.D.   On: 11/22/2020 10:23    Review of Systems  HENT:  Negative for ear discharge, ear pain, hearing loss and tinnitus.   Eyes:  Negative for photophobia and pain.  Respiratory:  Negative for cough and shortness of breath.   Cardiovascular:  Negative for chest pain.  Gastrointestinal:  Negative for abdominal pain, nausea and vomiting.  Genitourinary:  Negative for dysuria, flank pain, frequency and urgency.  Musculoskeletal:  Positive for arthralgias (Right wrist). Negative for back pain, myalgias and neck pain.  Neurological:  Negative for dizziness and headaches.  Hematological:  Does not bruise/bleed easily.  Psychiatric/Behavioral:  The patient is not nervous/anxious.   Blood pressure 126/63, pulse (!) 57, temperature 99.4 F (37.4 C), temperature source Oral, resp. rate 17, height 5\' 6"  (1.676 m), weight 68 kg, SpO2 96 %. Physical Exam Constitutional:      General: He is not in acute distress.    Appearance: He is well-developed. He is not diaphoretic.  HENT:     Head: Normocephalic and atraumatic.  Eyes:     General: No scleral icterus.       Right eye: No discharge.        Left eye: No discharge.     Conjunctiva/sclera: Conjunctivae normal.  Cardiovascular:     Rate and Rhythm: Normal rate and  regular rhythm.  Pulmonary:     Effort: Pulmonary effort is normal. No respiratory distress.  Musculoskeletal:     Cervical back: Normal range of motion.     Comments: Right  shoulder, elbow, wrist, digits- no skin wounds, sugar tong splint in place, no instability, no blocks to motion  Sens  Ax/R/M/U intact  Mot   Ax/ R/ PIN/ M/ AIN/ U intact  Fingers perfused  Skin:    General: Skin is warm and dry.  Neurological:     Mental Status: He is alert.  Psychiatric:        Mood and Affect: Mood normal.        Behavior: Behavior normal.    Assessment/Plan: Right wrist fx -- Would benefit from fixation. Could do tomorrow afternoon but could also discharge and schedule electively. Dr. Amedeo Plenty to evaluate tomorrow.  Seen and reviewed  Kana Reimann MD  Lisette Abu, PA-C Orthopedic Surgery [phone] 11/23/2020, 9:17 AM

## 2020-11-24 LAB — BASIC METABOLIC PANEL
Anion gap: 7 (ref 5–15)
BUN: 17 mg/dL (ref 8–23)
CO2: 26 mmol/L (ref 22–32)
Calcium: 8.4 mg/dL — ABNORMAL LOW (ref 8.9–10.3)
Chloride: 102 mmol/L (ref 98–111)
Creatinine, Ser: 0.97 mg/dL (ref 0.61–1.24)
GFR, Estimated: >60 mL/min (ref >60)
Glucose, Bld: 108 mg/dL — ABNORMAL HIGH (ref 70–99)
Potassium: 3.4 mmol/L — ABNORMAL LOW (ref 3.5–5.1)
Sodium: 135 mmol/L (ref 135–145)

## 2020-11-24 LAB — MAGNESIUM: Magnesium: 2.1 mg/dL (ref 1.7–2.4)

## 2020-11-24 MED ORDER — POVIDONE-IODINE 10 % EX SWAB: 2.0000 "application " | Freq: Once | CUTANEOUS | Status: DC

## 2020-11-24 MED ORDER — SODIUM CHLORIDE 0.9 % IV SOLN: INTRAVENOUS | Status: DC

## 2020-11-24 MED ORDER — CHLORHEXIDINE GLUCONATE 4 % EX LIQD
60.0000 mL | Freq: Once | CUTANEOUS | Status: AC
  Administered 2020-11-25: 4 via TOPICAL
  Filled 2020-11-24: qty 15

## 2020-11-24 MED ORDER — POTASSIUM CHLORIDE 20 MEQ PO PACK: 20.0000 meq | PACK | Freq: Once | ORAL | Status: DC

## 2020-11-24 MED ORDER — CEFAZOLIN SODIUM-DEXTROSE 2-4 GM/100ML-% IV SOLN
2.0000 g | INTRAVENOUS | Status: AC
  Administered 2020-11-25: 2 g via INTRAVENOUS
  Filled 2020-11-24: qty 100

## 2020-11-24 NOTE — Progress Notes (Signed)
Patient ID: Peter Gallegos, male   DOB: December 23, 1932, 87 y.o.   MRN: [id number] Patient was seen and examined at bedside.  Patient is stable.  Patient is awake alert and oriented.  Patient and I discussed all issues.  At present juncture the patient is stable and would like to proceed with surgical intervention for his right wrist fracture.  I discussed and the relevant issues.  We will plan to proceed with open reduction internal fixation tomorrow evening at 5 PM  I discussed all issues with the patient at length.  We are planning surgery for your upper extremity. The risk and benefits of surgery to include risk of bleeding, infection, anesthesia,  damage to normal structures and failure of the surgery to accomplish its intended goals of relieving symptoms and restoring function have been discussed in detail. With this in mind we plan to proceed. I have specifically discussed with the patient the pre-and postoperative regime and the dos and don'ts and risk and benefits in great detail. Risk and benefits of surgery also include risk of dystrophy(CRPS), chronic nerve pain, failure of the healing process to go onto completion and other inherent risks of surgery The relavent the pathophysiology of the disease/injury process, as well as the alternatives for treatment and postoperative course of action has been discussed in great detail with the patient who desires to proceed.  We will do everything in our power to help you (the patient) restore function to the upper extremity. It is a pleasure to see this patient today.   Roseanne Kaufman, MD

## 2020-11-24 NOTE — TOC Initial Note (Signed)
Transition of Care [facility]) - Initial/Assessment Note    Patient Details  Name: Peter Gallegos MRN: [id number] Date of Birth: [birth year]  Transition of Care [facility]) CM/SW Contact:    Ella Bodo, RN Phone Number: 11/24/2020, 1210 Clinical Narrative:  The pt is an 87 yo male presenting 6/19 after a fall at home resulting in laceration to R forehead, R wrist deformity. Work-up revealed small R frontal SAH with 3 mm shift and R distal radius/ulna fx. Prior to admission, patient independent and living at home with his wife.  PT/OT recommending outpatient therapies.  Patient agreeable to referral to outpatient therapy, and states that his wife is able to transport him there.  Referral made to [facility] for follow-up.               Expected Discharge Plan: OP Rehab Barriers to Discharge: Continued Medical Work up   Patient Goals and CMS Choice Patient states their goals for this hospitalization and ongoing recovery are:: to go home      Expected Discharge Plan and Services Expected Discharge Plan: OP Rehab   Discharge Planning Services: CM Consult   Living arrangements for the past 2 months: Single Family Home                                      Prior Living Arrangements/Services Living arrangements for the past 2 months: Single Family Home Lives with:: Spouse Patient language and need for interpreter reviewed:: Yes Do you feel safe going back to the place where you live?: Yes      Need for Family Participation in Patient Care: Yes (Comment) Care giver support system in place?: Yes (comment) Current home services: DME Criminal Activity/Legal Involvement Pertinent to Current Situation/Hospitalization: No - Comment as needed  Activities of Daily Living Home Assistive Devices/Equipment: None ADL Screening (condition at time of admission) Patient's cognitive ability adequate to safely complete daily activities?: Yes Is the patient deaf or have  difficulty hearing?: Yes Does the patient have difficulty seeing, even when wearing glasses/contacts?: No Does the patient have difficulty concentrating, remembering, or making decisions?: No Patient able to express need for assistance with ADLs?: Yes Does the patient have difficulty dressing or bathing?: No Independently performs ADLs?: Yes (appropriate for developmental age) Does the patient have difficulty walking or climbing stairs?: No Weakness of Legs: None Weakness of Arms/Hands: None  Permission Sought/Granted                  Emotional Assessment Appearance:: Appears stated age Attitude/Demeanor/Rapport: Engaged Affect (typically observed): Accepting Orientation: : Oriented to Self, Oriented to Place, Oriented to  Time, Oriented to Situation      Admission diagnosis:  SDH (subdural hematoma) ([place]) [S06.5X9A] Intracranial bleed ([place]) [I62.9] Fall, initial encounter [W19.XXXA] Closed fracture of right wrist, initial encounter [S62.101A] Patient Active Problem List   Diagnosis Date Noted   SDH (subdural hematoma) ([place]) 11/22/2020   H/O adenomatous polyp of colon 02/18/2013   Hx of transient ischemic attack (TIA) 02/18/2013   Depression 02/18/2013   Other and unspecified hyperlipidemia 02/18/2013   PCP:  Deland Pretty, MD Pharmacy:   [facility], [place] [address] Phone: [phone] Fax: [phone]     Social Determinants of Health (SDOH) Interventions    Readmission Risk Interventions No flowsheet data found.  Corliss Marcus.  Oren Section, RN, BSN  Trauma/Neuro ICU Case Manager [phone]

## 2020-11-24 NOTE — Progress Notes (Signed)
[facility] Progress Note     Subjective: CC:  Pain controlled. Tolerating PO - ate breakfast around 7:30-8 AM. Denies urinary sxs. Mobilized with PT/OT yesterday. His wife, Mrs. Noga, is at bedside.   AFVSS Objective: Vital signs in last 24 hours: Temp:  [97.7 F (36.5 C)-98.7 F (37.1 C)] 98.7 F (37.1 C) (06/21 0700) Pulse Rate:  [57-83] 65 (06/21 0500) Resp:  [12-23] 20 (06/21 0500) BP: (105-139)/(57-84) 118/57 (06/21 0700) SpO2:  [91 %-98 %] 93 % (06/21 0500) Last BM Date: 11/21/20  Intake/Output from previous day: 06/20 0701 - 06/21 0700 In: 1381.2 [P.O.:360; I.V.:821.2; IV Piggyback:200] Out: 525 [Urine:525] Intake/Output this shift: No intake/output data recorded.  PE: Gen:  Alert, NAD, pleasant and cooperative elderly male  HEENT: obvious facial trauma with right periorbital hematoma/abrasions and edema. PERRL, EOMs in tact.  Card:  Regular rate and rhythm, pedal pulses 2+ BL, no peripheral edema Pulm:  Normal effort, clear to auscultation bilaterally Abd: Soft, non-tender, non-distended, bowel sounds present in all 4 quadrants, no HSM Skin: warm and dry, no rashes  MSK: RUE splinted, fingers with mild edema, sensation in tact, wiggles all fingers, cap refill <2 Psych: A&Ox3  Lab Results:  Recent Labs    11/22/20 0924 11/23/20 0435  WBC 11.2* 9.4  HGB 15.3 14.0  HCT 45.4 40.8  PLT 213 194   BMET Recent Labs    11/23/20 0435 11/24/20 0254  NA 138 135  K 3.3* 3.4*  CL 99 102  CO2 28 26  GLUCOSE 108* 108*  BUN 16 17  CREATININE 1.09 0.97  CALCIUM 8.8* 8.4*   PT/INR No results for input(s): LABPROT, INR in the last 72 hours. CMP     Component Value Date/Time   NA 135 11/24/2020 0254   K 3.4 (L) 11/24/2020 0254   CL 102 11/24/2020 0254   CO2 26 11/24/2020 0254   GLUCOSE 108 (H) 11/24/2020 0254   BUN 17 11/24/2020 0254   CREATININE 0.97 11/24/2020 0254   CALCIUM 8.4 (L) 11/24/2020 0254   PROT 5.6 (L) 11/23/2020 0435    ALBUMIN 3.1 (L) 11/23/2020 0435   AST 23 11/23/2020 0435   ALT 23 11/23/2020 0435   ALKPHOS 49 11/23/2020 0435   BILITOT 1.1 11/23/2020 0435   GFRNONAA >60 11/24/2020 0254   GFRAA >60 07/05/2019 1102   Lipase  No results found for: LIPASE     Studies/Results: CT Head Wo Contrast  Result Date: 11/22/2020 CLINICAL DATA:  Status post fall. EXAM: CT HEAD WITHOUT CONTRAST TECHNIQUE: Contiguous axial images were obtained from the base of the skull through the vertex without intravenous contrast. COMPARISON:  November 22, 2020 at 8:22 a.m. FINDINGS: Brain: Redemonstrated is acute subdural hematoma overlying the anterior right frontal lobe, stable in size measuring 3 mm in thickness. Additional small amount of subarachnoid hemorrhage in the anterior right frontal lobe is also stable. Persistent 2-3 mm leftward midline shift. Mild brain parenchymal volume loss and extensive deep white matter ischemic microangiopathy. Vascular: No hyperdense vessel or unexpected calcification. Skull: Normal. Negative for fracture or focal lesion. Sinuses/Orbits: No acute finding. Other: Right frontal periorbital and maxillofacial hematoma. IMPRESSION: 1. Stable size of the acute subdural hematoma overlying the anterior right frontal lobe with stable 2-3 mm leftward midline shift. 2. Stable small amount of subarachnoid hemorrhage in the anterior right frontal lobe. 3. Mild brain parenchymal volume loss and extensive deep white matter ischemic microangiopathy. 4. Right frontal periorbital and maxillofacial hematoma. Electronically Signed   By:  Fidela Salisbury M.D.   On: 11/22/2020 16:09   CT Maxillofacial Wo Contrast  Result Date: 11/22/2020 CLINICAL DATA:  87 year old male status post fall with pain. EXAM: CT MAXILLOFACIAL WITHOUT CONTRAST TECHNIQUE: Multidetector CT imaging of the maxillofacial structures was performed. Multiplanar CT image reconstructions were also generated. COMPARISON:  Head and cervical spine CT 0822  hours today. FINDINGS: Osseous: Absent dentition. Mandible intact and normally located. No maxilla or zygoma fracture. Pterygoid plates are intact. Central skull base appears intact. Visible calvarium intact. Orbits: No orbital wall fracture. Right forehead and moderate sized right periorbital and lateral face superficial hematoma, also visible on the earlier head CT. Globes and intraorbital soft tissues appears symmetric and normal. Sinuses: Clear throughout. Soft tissues: Negative visible noncontrast deep soft tissue spaces of the face. Limited intracranial: Mixed density right anterior subdural hematoma appears stable since 0822 hours. Stable ventricle size and configuration. Basilar cisterns remain normal. IMPRESSION: 1. No facial fracture identified. 2. Right forehead, periorbital, and lateral face superficial hematoma as seen on the earlier head CT. 3. Visible right side subdural hematoma is stable since 0822 hours. Electronically Signed   By: Genevie Ann M.D.   On: 11/22/2020 10:23    Anti-infectives: Anti-infectives (From admission, onward)    None      Assessment/Plan 87 y/o M s/p ground level fall  6/19 0600  Right SDH, SAH with midline shift - repeat head CT head yesterday at 1609 stable SDH w/ 2-3 mm left shift, stable SAH anterior right frontal lobe. NS consulted (Ostergard) w/ no acute NS  intervention recommended. Keppra for seizure prophylaxis. Right forehead/facial abrasions/hematoma Right distal radius/ulna fracture - EDP ordered sugar-tong splint, pt requesting [facility] as he knows Dr. Amedeo Plenty. Ortho to see today. HTN H/o TIA on aggrenox - hold aggrenox x 1 week per NS   FEN: IVF @ 75cc/hr diet heart healthy >> make NPO for possible OR, last PO at 0800. ID: none VTE: SCDs, chemical VTE held 2/2 intracranial bleed, ok to start chemical VTE ppx today 6/21 per NS. Will continue to hold for possible OR. Dispo: progressive care, PT/OT recommending outpatient PT/OT, cognitive eval  pending today. await ortho recs  - Dr. Amedeo Plenty to see patient today. OR today for R forearm FX vs discharge home later today with outpatient ortho surgery.     LOS: 2 days    Obie Dredge, [facility] Please see Amion for pager number during day hours 7:00am-4:30pm

## 2020-11-24 NOTE — TOC CAGE-AID Note (Signed)
Transition of Care [facility]) - CAGE-AID Screening   Patient Details  Name: Peter Gallegos MRN: [id number] Date of Birth: 1933/03/04  Transition of Care [facility]) CM/SW Contact:    Clovis Cao, RN Phone Number: [phone] 11/24/2020, 11:39 AM   Clinical Narrative: Pt denies alcohol or drug use.   CAGE-AID Screening:    Have You Ever Felt You Ought to Cut Down on Your Drinking or Drug Use?: No Have People Annoyed You By Critizing Your Drinking Or Drug Use?: No Have You Felt Bad Or Guilty About Your Drinking Or Drug Use?: No Have You Ever Had a Drink or Used Drugs First Thing In The Morning to Steady Your Nerves or to Get Rid of a Hangover?: No CAGE-AID Score: 0  Substance Abuse Education Offered: No

## 2020-11-24 NOTE — Evaluation (Signed)
Speech Language Pathology Evaluation Patient Details Name: Peter Gallegos MRN: [id number] DOB: Oct 23, 1932 Today's Date: 11/24/2020 Time: 8366-2947 SLP Time Calculation (min) (ACUTE ONLY): 16 min  Problem List:  Patient Active Problem List   Diagnosis Date Noted   SDH (subdural hematoma) ([place]) 11/22/2020   H/O adenomatous polyp of colon 02/18/2013   Hx of transient ischemic attack (TIA) 02/18/2013   Depression 02/18/2013   Other and unspecified hyperlipidemia 02/18/2013   Past Medical History:  Past Medical History:  Diagnosis Date   Anxiety and depression    Depression    Diverticulosis    Family history of coronary artery disease    GERD (gastroesophageal reflux disease)    History of arm fracture    Rt arm, LT elbow fracture   History of colon polyps    History of ETOH abuse    Quit in 1980   Hypercholesterolemia    Hypertension    PONV (postoperative nausea and vomiting)    TIA (transient ischemic attack) 12/2005   Past Surgical History:  Past Surgical History:  Procedure Laterality Date   COLONOSCOPY     OPEN REDUCTION INTERNAL FIXATION (ORIF) DISTAL PHALANX Left 11/21/2013   Procedure: OPEN REDUCTION INTERNAL FIXATION (ORIF) DISTAL PHALANX;  Surgeon: Linna Hoff, MD;  Location: [facility];  Service: Orthopedics;  Laterality: Left;   HPI:  pt is an 87 yo male presenting 6/19 after a fall at home resulting in laceration to R forehead, R wrist deformity. Work-up revealed small R frontal SAH with 3 mm shift and R distal radius/ulna fx. PMH includes: anxiety/depression, GERD, HTN, TIA, and R arm fx   Assessment / Plan / Recommendation Clinical Impression  Pt appears to be at baseline functioning for cognitive linguistics per family report. [facility] Mental Status (SLUMS) was administered: 20/30. Mild deficits appreciated in delayed recall 1/5 and mental manipulation. PLOF pt very independent, retired Peter Gallegos. Pt with excellent family support, spouse  available 24 hours at DC. Receptive and expressive language as well as motor speech skills were intact. No further ST needs indicated as spouse and pt deny change in baseline cognitive functioning.    SLP Assessment  SLP Recommendation/Assessment: Patient does not need any further Speech Lanaguage Pathology Services SLP Visit Diagnosis: Cognitive communication deficit (R41.841)    Follow Up Recommendations  None    Frequency and Duration           SLP Evaluation Cognition  Overall Cognitive Status: Within Functional Limits for tasks assessed (grossly functional, spouse and pt report at baseline; mild deficits appreciated) Orientation Level: Oriented X4 Attention: Focused;Sustained;Alternating Focused Attention: Appears intact Sustained Attention: Appears intact Alternating Attention: Impaired Alternating Attention Impairment: Verbal complex;Functional complex Memory: Impaired Memory Impairment: Decreased recall of new information Awareness: Appears intact Problem Solving: Appears intact Executive Function: Organizing Organizing: Impaired Organizing Impairment: Verbal complex;Functional complex Safety/Judgment: Appears intact       Comprehension  Auditory Comprehension Overall Auditory Comprehension: Appears within functional limits for tasks assessed    Expression Expression Primary Mode of Expression: Verbal Verbal Expression Overall Verbal Expression: Appears within functional limits for tasks assessed Written Expression Dominant Hand: Right   Oral / Motor  Oral Motor/Sensory Function Overall Oral Motor/Sensory Function: Within functional limits Motor Speech Overall Motor Speech: Appears within functional limits for tasks assessed   GO                   Hayden Rasmussen MA, CCC-SLP Acute Rehabilitation Services   11/24/2020, 11:50 AM

## 2020-11-24 NOTE — Progress Notes (Signed)
Dr. Jacqulynn Cadet sent me a message and said surgery will be tomorrow afternoon.

## 2020-11-25 ENCOUNTER — Inpatient Hospital Stay (HOSPITAL_COMMUNITY): Payer: Medicare Other | Admitting: Anesthesiology

## 2020-11-25 ENCOUNTER — Inpatient Hospital Stay (HOSPITAL_COMMUNITY): Payer: Medicare Other

## 2020-11-25 ENCOUNTER — Encounter (HOSPITAL_COMMUNITY): Admission: EM | Disposition: A | Discharge status: Home / Self Care | Payer: Self-pay | Source: Home / Self Care

## 2020-11-25 ENCOUNTER — Encounter (HOSPITAL_COMMUNITY): Payer: Self-pay

## 2020-11-25 HISTORY — PX: ORIF WRIST FRACTURE: SHX2133

## 2020-11-25 SURGERY — OPEN REDUCTION INTERNAL FIXATION (ORIF) WRIST FRACTURE
Anesthesia: Monitor Anesthesia Care | Site: Wrist | Laterality: Right

## 2020-11-25 MED ORDER — MIDAZOLAM HCL 2 MG/2ML IJ SOLN
INTRAMUSCULAR | Status: AC
  Filled 2020-11-25: qty 2

## 2020-11-25 MED ORDER — OXYCODONE HCL 5 MG PO TABS
5.0000 mg | ORAL_TABLET | Freq: Once | ORAL | Status: DC | PRN
Start: 2020-11-25 — End: 2020-11-25

## 2020-11-25 MED ORDER — FENTANYL CITRATE (PF) 100 MCG/2ML IJ SOLN
INTRAMUSCULAR | Status: AC
  Administered 2020-11-25: 50 ug via INTRAVENOUS
  Filled 2020-11-25: qty 2

## 2020-11-25 MED ORDER — CEFAZOLIN SODIUM-DEXTROSE 1-4 GM/50ML-% IV SOLN
1.0000 g | Freq: Three times a day (TID) | INTRAVENOUS | Status: DC
  Administered 2020-11-25 – 2020-11-26 (×2): 1 g via INTRAVENOUS
  Filled 2020-11-25 (×4): qty 50

## 2020-11-25 MED ORDER — FENTANYL CITRATE (PF) 100 MCG/2ML IJ SOLN: 50.0000 ug | Freq: Once | INTRAMUSCULAR | Status: AC

## 2020-11-25 MED ORDER — ORAL CARE MOUTH RINSE: 15.0000 mL | Freq: Once | OROMUCOSAL | Status: AC

## 2020-11-25 MED ORDER — OXYCODONE HCL 5 MG/5ML PO SOLN
5.0000 mg | Freq: Once | ORAL | Status: DC | PRN
Start: 2020-11-25 — End: 2020-11-25

## 2020-11-25 MED ORDER — FENTANYL CITRATE (PF) 250 MCG/5ML IJ SOLN
INTRAMUSCULAR | Status: AC
  Filled 2020-11-25: qty 5

## 2020-11-25 MED ORDER — LACTATED RINGERS IV SOLN: INTRAVENOUS | Status: DC

## 2020-11-25 MED ORDER — ONDANSETRON HCL 4 MG/2ML IJ SOLN: 4.0000 mg | Freq: Once | INTRAMUSCULAR | Status: DC | PRN

## 2020-11-25 MED ORDER — BUPIVACAINE HCL (PF) 0.5 % IJ SOLN
INTRAMUSCULAR | Status: DC | PRN
  Administered 2020-11-25: 25 mL via PERINEURAL

## 2020-11-25 MED ORDER — 0.9 % SODIUM CHLORIDE (POUR BTL) OPTIME
TOPICAL | Status: DC | PRN
  Administered 2020-11-25: 1000 mL

## 2020-11-25 MED ORDER — DEXAMETHASONE SODIUM PHOSPHATE 10 MG/ML IJ SOLN
INTRAMUSCULAR | Status: DC | PRN
  Administered 2020-11-25: 10 mg

## 2020-11-25 MED ORDER — FENTANYL CITRATE (PF) 100 MCG/2ML IJ SOLN: 25.0000 ug | INTRAMUSCULAR | Status: DC | PRN

## 2020-11-25 MED ORDER — CHLORHEXIDINE GLUCONATE 0.12 % MT SOLN: 15.0000 mL | Freq: Once | OROMUCOSAL | Status: AC

## 2020-11-25 MED ORDER — PROPOFOL 500 MG/50ML IV EMUL
INTRAVENOUS | Status: DC | PRN
  Administered 2020-11-25: 100 ug/kg/min via INTRAVENOUS

## 2020-11-25 MED ORDER — CHLORHEXIDINE GLUCONATE 0.12 % MT SOLN
OROMUCOSAL | Status: AC
  Administered 2020-11-25: 15 mL via OROMUCOSAL
  Filled 2020-11-25: qty 15

## 2020-11-25 MED ORDER — ACETAMINOPHEN 500 MG PO TABS
1000.0000 mg | ORAL_TABLET | Freq: Once | ORAL | Status: AC
  Administered 2020-11-25: 1000 mg via ORAL
  Filled 2020-11-25: qty 2

## 2020-11-25 SURGICAL SUPPLY — 50 items
BIT DRILL 2.2 SS TIBIAL (BIT) ×1 IMPLANT
BNDG CMPR 9X4 STRL LF SNTH (GAUZE/BANDAGES/DRESSINGS) ×1
BNDG ELASTIC 3X5.8 VLCR STR LF (GAUZE/BANDAGES/DRESSINGS) ×2 IMPLANT
BNDG ELASTIC 4X5.8 VLCR STR LF (GAUZE/BANDAGES/DRESSINGS) ×2 IMPLANT
BNDG ESMARK 4X9 LF (GAUZE/BANDAGES/DRESSINGS) ×2 IMPLANT
BNDG GAUZE ELAST 4 BULKY (GAUZE/BANDAGES/DRESSINGS) ×3 IMPLANT
CANISTER SUCT 3000ML PPV (MISCELLANEOUS) ×2 IMPLANT
CORD BIPOLAR FORCEPS 12FT (ELECTRODE) ×2 IMPLANT
COVER SURGICAL LIGHT HANDLE (MISCELLANEOUS) ×2 IMPLANT
CUFF TOURN SGL QUICK 18X4 (TOURNIQUET CUFF) ×2 IMPLANT
DRAPE OEC MINIVIEW 54X84 (DRAPES) ×2 IMPLANT
DRAPE SURG 17X23 STRL (DRAPES) ×2 IMPLANT
DRSG ADAPTIC 3X8 NADH LF (GAUZE/BANDAGES/DRESSINGS) ×1 IMPLANT
GAUZE SPONGE 4X4 12PLY STRL (GAUZE/BANDAGES/DRESSINGS) ×2 IMPLANT
GAUZE XEROFORM 1X8 LF (GAUZE/BANDAGES/DRESSINGS) ×2 IMPLANT
GLOVE SURG MICRO LTX SZ8 (GLOVE) ×2 IMPLANT
GOWN STRL REUS W/ TWL LRG LVL3 (GOWN DISPOSABLE) ×1 IMPLANT
GOWN STRL REUS W/ TWL XL LVL3 (GOWN DISPOSABLE) ×1 IMPLANT
GOWN STRL REUS W/TWL LRG LVL3 (GOWN DISPOSABLE) ×2
GOWN STRL REUS W/TWL XL LVL3 (GOWN DISPOSABLE) ×4
KIT BASIN OR (CUSTOM PROCEDURE TRAY) ×2 IMPLANT
KIT TURNOVER KIT B (KITS) ×2 IMPLANT
NS IRRIG 1000ML POUR BTL (IV SOLUTION) ×2 IMPLANT
PACK ORTHO EXTREMITY (CUSTOM PROCEDURE TRAY) ×2 IMPLANT
PAD ARMBOARD 7.5X6 YLW CONV (MISCELLANEOUS) ×3 IMPLANT
PAD CAST 3X4 CTTN HI CHSV (CAST SUPPLIES) ×1 IMPLANT
PAD CAST 4YDX4 CTTN HI CHSV (CAST SUPPLIES) ×1 IMPLANT
PADDING CAST COTTON 3X4 STRL (CAST SUPPLIES) ×2
PADDING CAST COTTON 4X4 STRL (CAST SUPPLIES) ×2
PEG LOCKING SMOOTH 2.2X18 (Peg) ×2 IMPLANT
PEG LOCKING SMOOTH 2.2X22 (Screw) ×2 IMPLANT
PEG LOCKING SMOOTH 2.2X24 (Peg) ×2 IMPLANT
PEG LOCKING SMOOTH 2.2X26 (Peg) ×1 IMPLANT
PILLOW ARM CARTER ADULT (MISCELLANEOUS) ×1 IMPLANT
PLATE RIGHT VOLAR RIM DVR (Plate) ×1 IMPLANT
SCREW LOCK 14X2.7X 3 LD TPR (Screw) IMPLANT
SCREW LOCK 16X2.7X 3 LD TPR (Screw) IMPLANT
SCREW LOCKING 2.7X13MM (Screw) ×1 IMPLANT
SCREW LOCKING 2.7X14 (Screw) ×2 IMPLANT
SCREW LOCKING 2.7X16 (Screw) ×6 IMPLANT
SOL PREP POV-IOD 4OZ 10% (MISCELLANEOUS) ×4 IMPLANT
SPLINT FIBERGLASS 3X12 (CAST SUPPLIES) ×1 IMPLANT
SUT MNCRL AB 4-0 PS2 18 (SUTURE) ×2 IMPLANT
SUT PROLENE 4 0 PS 2 18 (SUTURE) ×2 IMPLANT
SUT VIC AB 3-0 FS2 27 (SUTURE) ×1 IMPLANT
TOWEL GREEN STERILE (TOWEL DISPOSABLE) ×2 IMPLANT
TOWEL GREEN STERILE FF (TOWEL DISPOSABLE) ×2 IMPLANT
TUBE CONNECTING 12X1/4 (SUCTIONS) ×2 IMPLANT
UNDERPAD 30X36 HEAVY ABSORB (UNDERPADS AND DIAPERS) ×2 IMPLANT
WATER STERILE IRR 1000ML POUR (IV SOLUTION) ×2 IMPLANT

## 2020-11-25 NOTE — Plan of Care (Signed)
  Problem: Safety: Goal: Ability to remain free from injury will improve Outcome: Progressing   Problem: Skin Integrity: Goal: Risk for impaired skin integrity will decrease Outcome: Progressing   Problem: Activity: Goal: Risk for activity intolerance will decrease Outcome: Progressing

## 2020-11-25 NOTE — Anesthesia Postprocedure Evaluation (Signed)
Anesthesia Post Note  Patient: Peter Gallegos  Procedure(s) Performed: OPEN REDUCTION INTERNAL FIXATION WRIST FRACTURE (Right: Wrist)     Patient location during evaluation: PACU Anesthesia Type: Regional Level of consciousness: awake and alert Pain management: pain level controlled Vital Signs Assessment: post-procedure vital signs reviewed and stable Respiratory status: spontaneous breathing, nonlabored ventilation, respiratory function stable and patient connected to nasal cannula oxygen Cardiovascular status: stable and blood pressure returned to baseline Postop Assessment: no apparent nausea or vomiting Anesthetic complications: no   No notable events documented.  Last Vitals:  Vitals:   11/25/20 2030 11/25/20 2100  BP: 138/72 132/68  Pulse: (!) 58 (!) 56  Resp: 19   Temp: (!) 36.2 C   SpO2: 94%     Last Pain:  Vitals:   11/25/20 2030  TempSrc: Oral  PainSc:                  Elanor Cale COKER

## 2020-11-25 NOTE — Transfer of Care (Signed)
Immediate Anesthesia Transfer of Care Note  Patient: Peter Gallegos  Procedure(s) Performed: OPEN REDUCTION INTERNAL FIXATION WRIST FRACTURE (Right: Wrist)  Patient Location: PACU  Anesthesia Type:MAC and Regional  Level of Consciousness: responds to stimulation  Airway & Oxygen Therapy: Patient Spontanous Breathing  Post-op Assessment: Report given to RN and Post -op Vital signs reviewed and stable  Post vital signs: Reviewed and stable  Last Vitals:  Vitals Value Taken Time  BP 95/57 11/25/20 1932  Temp    Pulse 52 11/25/20 1932  Resp 15 11/25/20 1932  SpO2 93 % 11/25/20 1932  Vitals shown include unvalidated device data.  Last Pain:  Vitals:   11/25/20 1745  TempSrc:   PainSc: 0-No pain      Patients Stated Pain Goal: 2 (76/81/15 7262)  Complications: No notable events documented.

## 2020-11-25 NOTE — Progress Notes (Signed)
Occupational Therapy Treatment Patient Details Name: Peter Gallegos MRN: [id number] DOB: 10-Sep-1932 Today's Date: 11/25/2020    History of present illness The pt is an 87 yo male presenting 6/19 after a fall at home resulting in laceration to R forehead, R wrist deformity. Work-up revealed small R frontal SAH with 3 mm shift and R distal radius/ulna fx. PMH includes: anxiety/depression, GERD, HTN, TIA, and R arm fx.   OT comments  Pt limited by pain in RUE and decreased ability to care for self. Pt improving since last visit with ADL and mobility. Pt generally takes small steps and walks slow at baseline.Pt limited by decreased strength, decreased acitivity tolerance and decreased ability to care for self. Pt set-upA to minA for ADL and minguardA overall with hand held assist +1.  Pt maintaining RUE NWB; minimal pain reported. RUE elevated prior to leaving room. Pt going for wrist sx tonight. Pt would greatly benefit from continued OT skilled services. OT following acutely.    Follow Up Recommendations  Outpatient OT    Equipment Recommendations  None recommended by OT    Recommendations for Other Services      Precautions / Restrictions Precautions Precautions: Fall Required Braces or Orthoses: Splint/Cast Splint/Cast: R wrist Restrictions Weight Bearing Restrictions: Yes RUE Weight Bearing: Non weight bearing       Mobility Bed Mobility Overal bed mobility: Modified Independent             General bed mobility comments: in recliner pre and post session    Transfers Overall transfer level: Needs assistance Equipment used: 1 person hand held assist Transfers: Sit to/from Stand Sit to Stand: Min guard         General transfer comment: minguardA overall    Balance Overall balance assessment: Needs assistance Sitting-balance support: No upper extremity supported;Feet supported Sitting balance-Leahy Scale: Normal     Standing balance support: No upper  extremity supported;During functional activity Standing balance-Leahy Scale: Good Standing balance comment: grooming at sink with no difficulty                           ADL either performed or assessed with clinical judgement   ADL Overall ADL's : Needs assistance/impaired     Grooming: Wash/dry hands;Wash/dry face;Oral care;Supervision/safety;Standing Grooming Details (indicate cue type and reason): pt NWB on RUE throughout; using LUE                 Toilet Transfer: Supervision/safety;Ambulation           Functional mobility during ADLs: Min guard;Supervision/safety;Cueing for safety (Hand held assist +1) General ADL Comments: Pt limited by pain in RUE and decreased ability to care for self. Pt improving since last visit with ADL and mobility. Pt generally takes small steps and walks slow at baseline.     Vision   Vision Assessment?: No apparent visual deficits   Perception     Praxis      Cognition Arousal/Alertness: Awake/alert Behavior During Therapy: WFL for tasks assessed/performed Overall Cognitive Status: Within Functional Limits for tasks assessed                                 General Comments: Following all commands; remembered meeting this Peter Gallegos.        Exercises     Shoulder Instructions       General Comments getting wrist ORIF at 5pm today  Pertinent Vitals/ Pain       Pain Assessment: 0-10 Pain Score: 2  Pain Location: RUE Pain Descriptors / Indicators: Discomfort;Sore Pain Intervention(s): Monitored during session;Premedicated before session;Repositioned;Other (comment) (elevated)  Home Living                                          Prior Functioning/Environment              Frequency  Min 2X/week        Progress Toward Goals  OT Goals(current goals can now be found in the care plan section)  Progress towards OT goals: Progressing toward goals  Acute Rehab OT  Goals Patient Stated Goal: to go home to his wife OT Goal Formulation: With patient Time For Goal Achievement: 12/07/20 Potential to Achieve Goals: Good ADL Goals Pt Will Perform Upper Body Dressing: with min guard assist;sitting Pt Will Perform Lower Body Dressing: with min guard assist;sitting/lateral leans Pt/caregiver will Perform Home Exercise Program: Increased ROM;Right Upper extremity;With minimal assist;With written HEP provided Additional ADL Goal #1: Pt will increase to supervisionA for OOB ADL x10 mins with minimal cues for safety.  Plan Discharge plan remains appropriate    Co-evaluation                 AM-PAC OT "6 Clicks" Daily Activity     Outcome Measure   Help from another person eating meals?: A Little Help from another person taking care of personal grooming?: None Help from another person toileting, which includes using toliet, bedpan, or urinal?: A Little Help from another person bathing (including washing, rinsing, drying)?: A Little Help from another person to put on and taking off regular upper body clothing?: A Little Help from another person to put on and taking off regular lower body clothing?: A Little 6 Click Score: 19    End of Session Equipment Utilized During Treatment: Gait belt  OT Visit Diagnosis: Unsteadiness on feet (R26.81);Muscle weakness (generalized) (M62.81);Pain Pain - Right/Left: Right Pain - part of body: Arm;Hand   Activity Tolerance Patient limited by pain   Patient Left in chair;with call bell/phone within reach;with chair alarm set   Nurse Communication Mobility status;Weight bearing status        Time: 4765-4650 OT Time Calculation (min): 23 min  Charges: OT General Charges $OT Visit: 1 Visit OT Treatments $Self Care/Home Management : 8-22 mins $Therapeutic Activity: 8-22 mins Peter Gallegos, OTR/L Acute Rehabilitation Services Pager: [phone] Office: [phone]    Peter Gallegos 11/25/2020, 8:04 PM

## 2020-11-25 NOTE — Progress Notes (Addendum)
[facility] Progress Note     Subjective: CC:  NAEO. No complaints. Pain controlled. Tolerated PO yesterday evening. SLP saw patient and recommended no follow up. Going to OR at 5PM with Dr. Amedeo Plenty.  AFVSS Objective: Vital signs in last 24 hours: Temp:  [98 F (36.7 C)-98.9 F (37.2 C)] 98.3 F (36.8 C) (06/22 0753) Pulse Rate:  [60-69] 60 (06/22 0753) Resp:  [14-20] 16 (06/22 0753) BP: (107-126)/(58-72) 126/72 (06/22 0753) SpO2:  [93 %-95 %] 95 % (06/22 0753) Last BM Date: 11/22/20  Intake/Output from previous day: 06/21 0701 - 06/22 0700 In: 1758.9 [P.O.:660; I.V.:898.9; IV Piggyback:200] Out: 1780 [Urine:1780] Intake/Output this shift: No intake/output data recorded.  PE: Gen:  Alert, NAD, pleasant and cooperative elderly male  HEENT: obvious facial trauma with right periorbital hematoma/abrasions and edema. PERRL, EOMs in tact.  Card:  Regular rate and rhythm, pedal pulses 2+ BL, no peripheral edema Pulm:  Normal effort, clear to auscultation bilaterally Abd: Soft, non-tender, non-distended, bowel sounds present in all 4 quadrants, no HSM Skin: warm and dry, no rashes  MSK: RUE splinted, fingers with mild edema, sensation in tact, wiggles all fingers, cap refill <2 Psych: A&Ox3  Lab Results:  Recent Labs    11/22/20 0924 11/23/20 0435  WBC 11.2* 9.4  HGB 15.3 14.0  HCT 45.4 40.8  PLT 213 194   BMET Recent Labs    11/23/20 0435 11/24/20 0254  NA 138 135  K 3.3* 3.4*  CL 99 102  CO2 28 26  GLUCOSE 108* 108*  BUN 16 17  CREATININE 1.09 0.97  CALCIUM 8.8* 8.4*   PT/INR No results for input(s): LABPROT, INR in the last 72 hours. CMP     Component Value Date/Time   NA 135 11/24/2020 0254   K 3.4 (L) 11/24/2020 0254   CL 102 11/24/2020 0254   CO2 26 11/24/2020 0254   GLUCOSE 108 (H) 11/24/2020 0254   BUN 17 11/24/2020 0254   CREATININE 0.97 11/24/2020 0254   CALCIUM 8.4 (L) 11/24/2020 0254   PROT 5.6 (L) 11/23/2020 0435   ALBUMIN  3.1 (L) 11/23/2020 0435   AST 23 11/23/2020 0435   ALT 23 11/23/2020 0435   ALKPHOS 49 11/23/2020 0435   BILITOT 1.1 11/23/2020 0435   GFRNONAA >60 11/24/2020 0254   GFRAA >60 07/05/2019 1102   Lipase  No results found for: LIPASE     Studies/Results: No results found.  Anti-infectives: Anti-infectives (From admission, onward)    Start     Dose/Rate Route Frequency Ordered Stop   11/25/20 0600  ceFAZolin (ANCEF) IVPB 2g/100 mL premix        2 g 200 mL/hr over 30 Minutes Intravenous On call to O.R. 11/24/20 1343 11/26/20 0559      Assessment/Plan 87 y/o M s/p ground level fall  6/19 0600  Right SDH, SAH with midline shift - repeat head CT head stable SDH w/ 2-3 mm left shift, stable SAH anterior right frontal lobe. NS consulted (Ostergard) w/ no acute NS  intervention recommended. Keppra for seizure prophylaxis. Right forehead/facial abrasions/hematoma Right distal radius/ulna fracture - EDP ordered sugar-tong splint, per Dr. Amedeo Plenty. HTN H/o TIA on aggrenox - hold aggrenox x 1 week per NS   FEN: IVF @ 75cc/hr, NPO for OR today ID: none VTE: SCDs, chemical VTE held 2/2 intracranial bleed, ok to start chemical VTE ppx 6/21 per NS. Will continue to hold for possible OR. Dispo:  OR today for R forearm FX, anticipate discharge  home tomorrow morning 6/23  LOS: 3 days    Obie Dredge, [facility] Please see Amion for pager number during day hours 7:00am-4:30pm

## 2020-11-25 NOTE — Anesthesia Procedure Notes (Signed)
Anesthesia Regional Block: Supraclavicular block   Pre-Anesthetic Checklist: , timeout performed,  Correct Patient, Correct Site, Correct Laterality,  Correct Procedure, Correct Position, site marked,  Risks and benefits discussed,  Surgical consent,  Pre-op evaluation,  At surgeon's request and post-op pain management  Laterality: Right  Prep: Maximum Sterile Barrier Precautions used, chloraprep       Needles:  Injection technique: Single-shot  Needle Type: Echogenic Stimulator Needle     Needle Length: 9cm  Needle Gauge: 22     Additional Needles:   Procedures:,,,, ultrasound used (permanent image in chart),,    Narrative:  Start time: 11/25/2020 5:40 PM End time: 11/25/2020 5:45 PM Injection made incrementally with aspirations every 5 mL.  Performed by: Personally  Anesthesiologist: Pervis Hocking, DO  Additional Notes: Monitors applied. No increased pain on injection. No increased resistance to injection. Injection made in 5cc increments. Good needle visualization. Patient tolerated procedure well.

## 2020-11-25 NOTE — Progress Notes (Signed)
Physical Therapy Treatment Patient Details Name: Peter Gallegos MRN: [id number] DOB: 31-May-1933 Today's Date: 11/25/2020    History of Present Illness The pt is an 87 yo male presenting 6/19 after a fall at home resulting in laceration to R forehead, R wrist deformity. Work-up revealed small R frontal SAH with 3 mm shift and R distal radius/ulna fx. PMH includes: anxiety/depression, GERD, HTN, TIA, and R arm fx.    PT Comments    Pt progressing well towards his physical therapy goals, demonstrating improved ambulation distance and activity tolerance. Pt ambulating x 200 feet with no assistive device at a supervision level. Negotiated 2 steps with a left railing ascending, right railing descending to simulate home set up. D/c plan remains appropriate.     Follow Up Recommendations  Outpatient PT;Supervision for mobility/OOB     Equipment Recommendations  None recommended by PT    Recommendations for Other Services       Precautions / Restrictions Precautions Precautions: Fall Required Braces or Orthoses: Splint/Cast Splint/Cast: R wrist Restrictions Weight Bearing Restrictions: Yes RUE Weight Bearing: Non weight bearing    Mobility  Bed Mobility Overal bed mobility: Modified Independent                  Transfers Overall transfer level: Needs assistance Equipment used: None Transfers: Sit to/from Stand Sit to Stand: Supervision            Ambulation/Gait Ambulation/Gait assistance: Supervision Gait Distance (Feet): 200 Feet Assistive device: None Gait Pattern/deviations: Step-through pattern;Decreased stride length;Shuffle     General Gait Details: Slow and steady pace, decreased bilateral foot clearance, supervision for safety   Stairs Stairs: Yes Stairs assistance: Supervision Stair Management: One rail Right Number of Stairs: 2 General stair comments: Step over step pattern   Wheelchair Mobility    Modified Rankin (Stroke Patients  Only) Modified Rankin (Stroke Patients Only) Pre-Morbid Rankin Score: No symptoms Modified Rankin: Moderately severe disability     Balance Overall balance assessment: Needs assistance Sitting-balance support: No upper extremity supported;Feet supported Sitting balance-Leahy Scale: Normal     Standing balance support: No upper extremity supported;During functional activity Standing balance-Leahy Scale: Good                              Cognition Arousal/Alertness: Awake/alert Behavior During Therapy: WFL for tasks assessed/performed Overall Cognitive Status: Within Functional Limits for tasks assessed                                        Exercises      General Comments        Pertinent Vitals/Pain Pain Assessment: No/denies pain    Home Living                      Prior Function            PT Goals (current goals can now be found in the care plan section) Acute Rehab PT Goals Patient Stated Goal: to go home to his wife Potential to Achieve Goals: Good Progress towards PT goals: Progressing toward goals    Frequency    Min 3X/week      PT Plan Current plan remains appropriate    Co-evaluation              AM-PAC PT "6 Clicks" Mobility  Outcome Measure  Help needed turning from your back to your side while in a flat bed without using bedrails?: None Help needed moving from lying on your back to sitting on the side of a flat bed without using bedrails?: None Help needed moving to and from a bed to a chair (including a wheelchair)?: None Help needed standing up from a chair using your arms (e.g., wheelchair or bedside chair)?: A Little Help needed to walk in hospital room?: A Little Help needed climbing 3-5 steps with a railing? : A Little 6 Click Score: 21    End of Session Equipment Utilized During Treatment: Gait belt Activity Tolerance: Patient tolerated treatment well Patient left: in bed;with call  bell/phone within reach Nurse Communication: Mobility status PT Visit Diagnosis: Unsteadiness on feet (R26.81);Other abnormalities of gait and mobility (R26.89)     Time: 1557-1610 PT Time Calculation (min) (ACUTE ONLY): 13 min  Charges:  $Therapeutic Activity: 8-22 mins                     Peter Gallegos, PT, DPT Acute Rehabilitation Services Pager [phone] Office [phone]    Peter Gallegos 11/25/2020, 4:59 PM

## 2020-11-25 NOTE — Op Note (Signed)
Operative note November 25, 2020  Roseanne Kaufman MD  Preoperative diagnosis: Right Distal radius fracture comminuted complex greater than 3 part intra-articular  Postop diagnosis: Same  Procedure: #1  Right open reduction internal fixation comminuted complex distal radius fracture with DVR [employer] cross lock plate and screw construct.  This was a greater than 3 part intra-articular fracture.  #2 AP lateral and oblique x-rays performed examined and interpreted by myself #3 brachioradialis tenotomy right wrist  Cheryle Dark MD  Anesthesia: Block with IV sedation  Estimated blood loss minimal  Complications none immediate  Operative indications the patient presents for evaluation and surgical care.  Patient understands risk benefits and desires to proceed.  We have discussed with the patient all issues plans and concerns with this in mind we will proceed accordingly. We are planning surgery for your upper extremity. The risk and benefits of surgery to include risk of bleeding, infection, anesthesia,  damage to normal structures and failure of the surgery to accomplish its intended goals of relieving symptoms and restoring function have been discussed in detail. With this in mind we plan to proceed. I have specifically discussed with the patient the pre-and postoperative regime and the dos and don'ts and risk and benefits in great detail. Risk and benefits of surgery also include risk of dystrophy(CRPS), chronic nerve pain, failure of the healing process to go onto completion and other inherent risks of surgery The relavent the pathophysiology of the disease/injury process, as well as the alternatives for treatment and postoperative course of action has been discussed in great detail with the patient who desires to proceed.  We will do everything in our power to help you (the patient) restore function to the upper extremity. It is a pleasure to see this patient today.    Operative procedure: Patient was  seen by myself and anesthesia.  Appropriate anesthesia was induced and following this the patient was prepped with a Hibiclens pre-scrub followed by 10-minute surgical Betadine scrub and paint.  Once this was completed the extremity was elevated and the tourniquet was insufflated to 250 mmHg.  Timeout was observed preoperative antibiotics were given and the patient then underwent a very careful and cautious approach to the extremity with volar radial incision under 250 mm tourniquet control.  FCR tendon sheath was identified and dissected.  There were no complicating features.  Once this was completed the carpal canal contents were retracted ulnarly and the FCR was retracted radially.  We took very meticulous care of the radial artery and the carpal canal contents during the approach.  We performed a sliding brachial radialis tenotomy due to the deforming forces of the radial styloid.  This is very carefully released without complication.  This was a brachial radialis tenotomy.  The pronator was accessed incised and lifted off of the fracture.  The fracture was then reassembled with standard orthopedic equipment and a DVR plate and screw construct from [employer] was accomplished in terms of placement and fixation of the fracture. This was a volar rim plate standard in nature. Adequate radial height, volar tilt and radial inclination was restored.  The distal radial ulnar joint, radiocarpal and midcarpal joints all were  stable and satisfactory.  We irrigated copiously and closed the pronator with 3-0 Vicryl followed by closure of the skin edge with Prolene.  Once again, the distal radius underwent open reduction internal fixation without complications.  The distal radial ulnar joint was stable.  The patient had no complications.  All radiographic parameters look quite  well following the fixation.  Standard dressing of Adaptic Xeroform 4 x 4's gauze web roll Kerlix and a volar splint were applied.  The  patient understands instructions of elevate move massage fingers notify us any problems occur and follow-up care according to our standard protocol for a DVR plate and screw construct.  He has been a pleasure participate in the patient's care and we look forward to spent in the patient's recovery.  Roseanne Kaufman MD

## 2020-11-25 NOTE — Care Management Important Message (Signed)
Important Message  Patient Details  Name: Peter Gallegos MRN: [id number] Date of Birth: [birth year]   Medicare Important Message Given:  Yes     Bryelle Spiewak 11/25/2020, 4:00 PM

## 2020-11-25 NOTE — H&P (Signed)
Patient ready for surgery We are planning surgery for your upper extremity. The risk and benefits of surgery to include risk of bleeding, infection, anesthesia,  damage to normal structures and failure of the surgery to accomplish its intended goals of relieving symptoms and restoring function have been discussed in detail. With this in mind we plan to proceed. I have specifically discussed with the patient the pre-and postoperative regime and the dos and don'ts and risk and benefits in great detail. Risk and benefits of surgery also include risk of dystrophy(CRPS), chronic nerve pain, failure of the healing process to go onto completion and other inherent risks of surgery The relavent the pathophysiology of the disease/injury process, as well as the alternatives for treatment and postoperative course of action has been discussed in great detail with the patient who desires to proceed.  We will do everything in our power to help you (the patient) restore function to the upper extremity. It is a pleasure to see this patient today.   Zamire Whitehurst MD

## 2020-11-25 NOTE — Anesthesia Preprocedure Evaluation (Addendum)
Anesthesia Evaluation  Patient identified by MRN, date of birth, ID band Patient awake    Reviewed: Allergy & Precautions, NPO status , Patient's Chart, lab work & pertinent test results  History of Anesthesia Complications (+) PONV and history of anesthetic complications  Airway Mallampati: III  TM Distance: >3 FB Neck ROM: Full   Comment: Large beard Dental  (+) Edentulous Upper, Edentulous Lower   Pulmonary former smoker,    Pulmonary exam normal breath sounds clear to auscultation       Cardiovascular hypertension, Pt. on medications Normal cardiovascular exam Rhythm:Regular Rate:Normal     Neuro/Psych PSYCHIATRIC DISORDERS Anxiety Depression  presented 6/19 after a fall at home resulting in laceration to R forehead, R wrist deformity. Work-up revealed small R frontal SAH with 3 mm shift and R distal radius/ulna fx  SAH no intervention, keppra for seizure px TIA   GI/Hepatic GERD  ,(+)     substance abuse (hx etOH abuse remote past)  alcohol use,   Endo/Other  negative endocrine ROS  Renal/GU negative Renal ROS  negative genitourinary   Musculoskeletal Right wrist fx   Abdominal   Peds  Hematology negative hematology ROS (+) hct 40.8   Anesthesia Other Findings   Reproductive/Obstetrics negative OB ROS                            Anesthesia Physical Anesthesia Plan  ASA: 3  Anesthesia Plan: MAC and Regional   Post-op Pain Management:    Induction:   PONV Risk Score and Plan: 2 and Propofol infusion and TIVA  Airway Management Planned: Natural Airway and Simple Face Mask  Additional Equipment: None  Intra-op Plan:   Post-operative Plan:   Informed Consent: I have reviewed the patients History and Physical, chart, labs and discussed the procedure including the risks, benefits and alternatives for the proposed anesthesia with the patient or authorized representative who  has indicated his/her understanding and acceptance.     Dental advisory given  Plan Discussed with: CRNA  Anesthesia Plan Comments:        Anesthesia Quick Evaluation

## 2020-11-26 ENCOUNTER — Other Ambulatory Visit: Payer: Self-pay | Admitting: General Surgery

## 2020-11-26 ENCOUNTER — Encounter (HOSPITAL_COMMUNITY): Payer: Self-pay | Admitting: Orthopedic Surgery

## 2020-11-26 MED ORDER — ACETAMINOPHEN 325 MG PO TABS: 650.0000 mg | ORAL_TABLET | ORAL | Status: AC | PRN

## 2020-11-26 MED ORDER — OXYCODONE HCL 5 MG PO TABS: 2.5000 mg | ORAL_TABLET | ORAL | 0 refills | Status: AC | PRN

## 2020-11-26 MED ORDER — ASPIRIN-DIPYRIDAMOLE ER 25-200 MG PO CP12: 1.0000 | ORAL_CAPSULE | Freq: Two times a day (BID) | ORAL | 0 refills | Status: AC

## 2020-11-26 MED ORDER — DOCUSATE SODIUM 100 MG PO CAPS: 100.0000 mg | ORAL_CAPSULE | Freq: Two times a day (BID) | ORAL | 0 refills | Status: AC | PRN

## 2020-11-26 NOTE — TOC Transition Note (Signed)
Transition of Care [facility]) - CM/SW Discharge Note   Patient Details  Name: Peter Gallegos MRN: [id number] Date of Birth: 02/01/1933  [facility] of [facility] CM/SW Contact:  Ella Bodo, RN Phone Number: 11/26/2020, 12:05 PM   Clinical Narrative:   Pt medically stable for discharge home today with spouse.  Referrals have been made to OP rehab center for follow up. Follow up appt made with PCP; no other dc needs identified.     Final next level of care: OP Rehab Barriers to Discharge: Continued Medical Work up   Patient Goals and CMS Choice Patient states their goals for this hospitalization and ongoing recovery are:: to go home                            Discharge Plan and Services   Discharge Planning Services: CM Consult                                 Social Determinants of Health (SDOH) Interventions     Readmission Risk Interventions Readmission Risk Prevention Plan 11/26/2020  Post Dischage Appt Complete  Medication Screening Complete  Transportation Screening Complete  Some recent data might be hidden   Reinaldo Raddle, RN, BSN  Trauma/Neuro ICU Case Manager [phone]

## 2020-11-26 NOTE — Progress Notes (Signed)
Patient discharged without Keppra. Called pharmacy and prescribed medication to complete additional 4 days

## 2020-11-26 NOTE — Progress Notes (Addendum)
Progress Note  1 Day Post-Op  Subjective: CC: feeling well this morning and without complaints. Still with some lingering numbness in right hand from surgery and pain well controlled. At all of his breakfast. He denies headache or facial pain. Eager to go home   Objective: Vital signs in last 24 hours: Temp:  [96.1 F (35.6 C)-98.7 F (37.1 C)] 98.7 F (37.1 C) (06/23 0726) Pulse Rate:  [52-68] 68 (06/23 0726) Resp:  [11-20] 18 (06/23 0726) BP: (95-177)/(57-82) 120/82 (06/23 0726) SpO2:  [93 %-100 %] 95 % (06/23 0726) Last BM Date: 11/22/20  Intake/Output from previous day: 06/22 0701 - 06/23 0700 In: 1884.1 [P.O.:50; I.V.:1578.8; IV Piggyback:255.3] Out: 2878 [Urine:1750; Blood:5] Intake/Output this shift: No intake/output data recorded.  PE: General: pleasant, WD, male who is sitting up in bed in NAD HEENT: right facial ecchymosis and abrasions - forehead and periorbital Heart: regular, rate, and rhythm. Palpable left radial and pedal pulses bilaterally Lungs: CTAB, no wheezes, rhonchi, or rales noted.  Respiratory effort nonlabored Abd: soft, NT, ND MS: right upper extremity elevated and wrapped with ace bandage. Sensation and movement of fingers intact. No calf TTP bilaterally Skin: warm and dry Psych: A&Ox3 with an appropriate affect.    Lab Results:  No results for input(s): WBC, HGB, HCT, PLT in the last 72 hours. BMET Recent Labs    11/24/20 0254  NA 135  K 3.4*  CL 102  CO2 26  GLUCOSE 108*  BUN 17  CREATININE 0.97  CALCIUM 8.4*   PT/INR No results for input(s): LABPROT, INR in the last 72 hours. CMP     Component Value Date/Time   NA 135 11/24/2020 0254   K 3.4 (L) 11/24/2020 0254   CL 102 11/24/2020 0254   CO2 26 11/24/2020 0254   GLUCOSE 108 (H) 11/24/2020 0254   BUN 17 11/24/2020 0254   CREATININE 0.97 11/24/2020 0254   CALCIUM 8.4 (L) 11/24/2020 0254   PROT 5.6 (L) 11/23/2020 0435   ALBUMIN 3.1 (L) 11/23/2020 0435   AST 23  11/23/2020 0435   ALT 23 11/23/2020 0435   ALKPHOS 49 11/23/2020 0435   BILITOT 1.1 11/23/2020 0435   GFRNONAA >60 11/24/2020 0254   GFRAA >60 07/05/2019 1102   Lipase  No results found for: LIPASE     Studies/Results: DG MINI C-ARM IMAGE ONLY  Result Date: 11/25/2020 There is no interpretation for this exam.  This order is for images obtained during a surgical procedure.  Please See "Surgeries" Tab for more information regarding the procedure.    Anti-infectives: Anti-infectives (From admission, onward)    Start     Dose/Rate Route Frequency Ordered Stop   11/26/20 0000  ceFAZolin (ANCEF) IVPB 1 g/50 mL premix        1 g 100 mL/hr over 30 Minutes Intravenous Every 8 hours 11/25/20 2046 11/27/20 0559   11/25/20 0600  ceFAZolin (ANCEF) IVPB 2g/100 mL premix        2 g 200 mL/hr over 30 Minutes Intravenous On call to O.R. 11/24/20 1343 11/25/20 1852        Assessment/Plan  87 y/o M s/p ground level fall  6/19 0600   Right SDH, SAH with midline shift - repeat head CT head stable SDH w/ 2-3 mm left shift, stable SAH anterior right frontal lobe. NS consulted (Ostergard) w/ no acute NS  intervention recommended. Keppra for seizure prophylaxis. Right forehead/facial abrasions/hematoma Right distal radius/ulna fracture - POD1 ORIF HTN H/o TIA on aggrenox -  hold aggrenox x 1 week per NS   FEN: IVF @ 75cc/hr, regular ID: none VTE: SCDs, chemical VTE held 2/2 intracranial bleed, ok to start chemical VTE ppx 6/21 per NS. Will continue to hold for possible OR. Dispo:  discharge today    LOS: 4 days    Winferd Humphrey, [facility] 11/26/2020, 10:15 AM Please see Amion for pager number during day hours 7:00am-4:30pm

## 2020-11-26 NOTE — Discharge Summary (Signed)
Physician Discharge Summary  Patient ID: Khing Belcher MRN: [id number] DOB/AGE: [birth year] 87 y.o.  Admit date: 11/22/2020 Discharge date: 11/26/2020  Admission Diagnoses SDH (subdural hematoma) ([place]) [S06.5X9A] Intracranial bleed ([place]) [I62.9] Fall, initial encounter [W19.XXXA] Closed fracture of right wrist, initial encounter [S62.101A]  Discharge Diagnoses Patient Active Problem List   Diagnosis Date Noted   SDH (subdural hematoma) ([place]) 11/22/2020   Hx of transient ischemic attack (TIA) 02/18/2013  Closed fracture of right wrist  Consultants Orthopedics - Dr. Amedeo Plenty Neurosurgery - Dr. Zada Finders  Procedures Dr. Amedeo Plenty 11/25/20: : #1  Right open reduction internal fixation comminuted complex distal radius fracture with DVR Biomet cross lock plate and screw construct.  This was a greater than 3 part intra-articular fracture.  #2 AP lateral and oblique x-rays performed examined and interpreted by myself #3 brachioradialis tenotomy right wrist  HPI:  Peter Gallegos is an 87 y.o. male with medical history of hypertension, remote TIA on aggrenox who reported to [facility] ED for evaluation after sustaining a ground-level fall the morning of presentation - 11/22/20.  He tripped on the corner of a iron chair outside and landed on his right side on the concrete patio.  He did not have loss of consciousness.  He hurt his right wrist.    Hospital Course:  Work up in the ED significant for right subdural hematoma, subarachnoid hematoma with midline shift, right forehead/facial abrasions/hematoma, right distal radius/ulna fracture. Neurosurgery was consulted with recommendations to hold aggrenox for 1 week. Orthopedic surgery was consulted and he underwent procedure as above.  On date of discharge patient had appropriately progressed and met criteria for safe discharge home with the support of his wife who is a retired Marine scientist.  I discussed discharge instructions with patient as well  as return precautions and all questions and concerns were addressed.   He was discharged with oxycodone 2.97m #15/0 for pain control and keppra 500 mg bid for seizure prophylaxis.  I or a member of my team have reviewed this patient in the Controlled Substance Database.  Patient agrees to follow up as below.   Allergies as of 11/26/2020   No Known Allergies      Medication List     TAKE these medications    acetaminophen 325 MG tablet Commonly known as: TYLENOL Take 2 tablets (650 mg total) by mouth every 4 (four) hours as needed for up to 7 days for mild pain or moderate pain.   alendronate 70 MG tablet Commonly known as: FOSAMAX Take 70 mg by mouth once a week.   atorvastatin 40 MG tablet Commonly known as: LIPITOR Take 40 mg by mouth daily.   buPROPion 300 MG 24 hr tablet Commonly known as: WELLBUTRIN XL Take 300 mg by mouth daily.   CALCIUM PO Take 1 tablet by mouth daily.   dipyridamole-aspirin 200-25 MG 12hr capsule Commonly known as: AGGRENOX Take 1 capsule by mouth 2 (two) times daily. DO NOT resume until 11/30/20 What changed: additional instructions   docusate sodium 100 MG capsule Commonly known as: COLACE Take 1 capsule (100 mg total) by mouth 2 (two) times daily as needed for up to 5 days for mild constipation or moderate constipation.   multivitamin with minerals Tabs tablet Take 1 tablet by mouth daily.   oxyCODONE 5 MG immediate release tablet Commonly known as: Oxy IR/ROXICODONE Take 0.5 tablets (2.5 mg total) by mouth every 4 (four) hours as needed for up to 5 days for moderate pain or severe pain (  take 2.53m to 529mevery 4 hours for pain. do not take more than 5 mg total every 4 hours).   potassium chloride 10 MEQ tablet Commonly known as: KLOR-CON Take 10 mEq by mouth daily.   sertraline 50 MG tablet Commonly known as: ZOLOFT Take 50 mg by mouth daily.   triamterene-hydrochlorothiazide 37.5-25 MG tablet Commonly known as:  MAXZIDE-25 Take 1 tablet by mouth daily.   Vitamin D 125 MCG (5000 UT) Caps Take 5,000 Units by mouth daily.          Follow-up Information     MuRenette ButtersMD. Schedule an appointment as soon as possible for a visit in 3 days.   Specialty: Orthopedic Surgery Why: follow up for right wrist fracture Contact information: [phone]         [facility] Call.   Specialty: Rehabilitation Why: Call ASAP for outpatient physical and occupational therapy appointments. Contact information: [phone]      PhDeland PrettyMD. GoDaphane Shepherdn 12/15/2020.   Specialty: Internal Medicine Why: at 1:00pm for hospital follow-up [facility]: [phone]         CCNewryollow up.   Why: follow up with usKoreas not required but please call usKoreaith any questions or concerns Contact information: [phone]              Signed: MaCaroll RanchereNew [facility] 11/26/2020, 2:00 PM Please see Amion for pager number during day hours 7:00am-4:30pm

## 2020-11-26 NOTE — Plan of Care (Signed)
  Problem: Activity: Goal: Risk for activity intolerance will decrease Outcome: Progressing   Problem: Nutrition: Goal: Adequate nutrition will be maintained Outcome: Progressing   Problem: Pain Managment: Goal: General experience of comfort will improve Outcome: Progressing   Problem: Safety: Goal: Ability to remain free from injury will improve Outcome: Progressing   Problem: Skin Integrity: Goal: Risk for impaired skin integrity will decrease Outcome: Progressing   

## 2020-11-26 NOTE — Progress Notes (Signed)
IV removed, D/C instructions reviewed with pt and wife.  Pt transported via wheelchair home with wife.

## 2020-12-09 DIAGNOSIS — Z4789 Encounter for other orthopedic aftercare: Secondary | ICD-10-CM | POA: Diagnosis not present

## 2020-12-09 DIAGNOSIS — S52571D Other intraarticular fracture of lower end of right radius, subsequent encounter for closed fracture with routine healing: Secondary | ICD-10-CM | POA: Diagnosis not present

## 2020-12-15 DIAGNOSIS — I10 Essential (primary) hypertension: Secondary | ICD-10-CM | POA: Diagnosis not present

## 2020-12-15 DIAGNOSIS — S066X0D Traumatic subarachnoid hemorrhage without loss of consciousness, subsequent encounter: Secondary | ICD-10-CM | POA: Diagnosis not present

## 2020-12-15 DIAGNOSIS — E785 Hyperlipidemia, unspecified: Secondary | ICD-10-CM | POA: Diagnosis not present

## 2020-12-15 DIAGNOSIS — S52601F Unspecified fracture of lower end of right ulna, subsequent encounter for open fracture type IIIA, IIIB, or IIIC with routine healing: Secondary | ICD-10-CM | POA: Diagnosis not present

## 2020-12-15 DIAGNOSIS — Z09 Encounter for follow-up examination after completed treatment for conditions other than malignant neoplasm: Secondary | ICD-10-CM | POA: Diagnosis not present

## 2020-12-15 DIAGNOSIS — S52501F Unspecified fracture of the lower end of right radius, subsequent encounter for open fracture type IIIA, IIIB, or IIIC with routine healing: Secondary | ICD-10-CM | POA: Diagnosis not present

## 2020-12-15 DIAGNOSIS — M810 Age-related osteoporosis without current pathological fracture: Secondary | ICD-10-CM | POA: Diagnosis not present

## 2020-12-15 DIAGNOSIS — Z8673 Personal history of transient ischemic attack (TIA), and cerebral infarction without residual deficits: Secondary | ICD-10-CM | POA: Diagnosis not present

## 2020-12-15 DIAGNOSIS — S065X9A Traumatic subdural hemorrhage with loss of consciousness of unspecified duration, initial encounter: Secondary | ICD-10-CM | POA: Diagnosis not present

## 2020-12-23 DIAGNOSIS — M25531 Pain in right wrist: Secondary | ICD-10-CM | POA: Diagnosis not present

## 2020-12-23 DIAGNOSIS — Z4789 Encounter for other orthopedic aftercare: Secondary | ICD-10-CM | POA: Diagnosis not present

## 2020-12-29 DIAGNOSIS — Z08 Encounter for follow-up examination after completed treatment for malignant neoplasm: Secondary | ICD-10-CM | POA: Diagnosis not present

## 2020-12-29 DIAGNOSIS — Z85828 Personal history of other malignant neoplasm of skin: Secondary | ICD-10-CM | POA: Diagnosis not present

## 2020-12-31 DIAGNOSIS — M25531 Pain in right wrist: Secondary | ICD-10-CM | POA: Diagnosis not present

## 2021-01-13 DIAGNOSIS — M25531 Pain in right wrist: Secondary | ICD-10-CM | POA: Diagnosis not present

## 2021-01-21 DIAGNOSIS — M25531 Pain in right wrist: Secondary | ICD-10-CM | POA: Diagnosis not present

## 2021-01-21 DIAGNOSIS — S52501D Unspecified fracture of the lower end of right radius, subsequent encounter for closed fracture with routine healing: Secondary | ICD-10-CM | POA: Diagnosis not present

## 2021-01-28 DIAGNOSIS — M25631 Stiffness of right wrist, not elsewhere classified: Secondary | ICD-10-CM | POA: Diagnosis not present

## 2021-02-04 DIAGNOSIS — M25631 Stiffness of right wrist, not elsewhere classified: Secondary | ICD-10-CM | POA: Diagnosis not present

## 2021-02-04 DIAGNOSIS — M25531 Pain in right wrist: Secondary | ICD-10-CM | POA: Diagnosis not present

## 2021-02-10 DIAGNOSIS — M25531 Pain in right wrist: Secondary | ICD-10-CM | POA: Diagnosis not present

## 2021-02-10 DIAGNOSIS — M25631 Stiffness of right wrist, not elsewhere classified: Secondary | ICD-10-CM | POA: Diagnosis not present

## 2021-02-19 ENCOUNTER — Other Ambulatory Visit: Payer: Self-pay

## 2021-02-19 ENCOUNTER — Ambulatory Visit: Payer: Medicare Other | Admitting: Podiatry

## 2021-02-19 ENCOUNTER — Encounter: Payer: Self-pay | Admitting: Podiatry

## 2021-02-19 DIAGNOSIS — B351 Tinea unguium: Secondary | ICD-10-CM | POA: Diagnosis not present

## 2021-02-19 DIAGNOSIS — M79675 Pain in left toe(s): Secondary | ICD-10-CM

## 2021-02-19 DIAGNOSIS — M79674 Pain in right toe(s): Secondary | ICD-10-CM | POA: Diagnosis not present

## 2021-02-19 NOTE — Progress Notes (Signed)
  Subjective:  Patient ID: Peter Gallegos, male    DOB: May 31, 1933,  MRN: [id number]  Chief Complaint  Patient presents with   Nail Problem    Nail trim    87 y.o. male returns for the above complaint.  Patient presents with thickened elongated dystrophic onychomycosis bilaterally x10.  They are painful to palpation.  Patient states that they are very hard to and painful to ambulate on.  Patient would like to have the repair done.  He has no other acute concerns.  Objective:  There were no vitals filed for this visit. Podiatric Exam: Vascular: dorsalis pedis and posterior tibial pulses are palpable bilateral. Capillary return is immediate. Temperature gradient is WNL. Skin turgor WNL  Sensorium: Normal Semmes Weinstein monofilament test. Normal tactile sensation bilaterally. Nail Exam: Pt has thick disfigured discolored nails with subungual debris noted bilateral entire nail hallux through fifth toenails.  Pain on palpation to the nails. Ulcer Exam: There is no evidence of ulcer or pre-ulcerative changes or infection. Orthopedic Exam: Muscle tone and strength are WNL. No limitations in general ROM. No crepitus or effusions noted. HAV  B/L.  Hammer toes 2-5  B/L. Skin: Hyperkeratotic lesion noted to bilateral medial aspect of the hallux.  No pinpoint bleeding noted.  Pain on palpation to the lesion. No infection or ulcers.  Dry skin without fissure or pain to the bilateral lower extremity    Assessment & Plan:   No diagnosis found.    Patient was evaluated and treated and all questions answered.  Xerosis bilateral lower extremity -I explained to the patient the etiology of xerosis and various treatment options were extensively discussed.  I explained to the patient the importance of maintaining moisturization of the skin with application of over-the-counter lotion such as Eucerin or Luciderm.  I have asked the patient to apply this twice a day.  If unable to resolve patient will  benefit from prescription lotion.  Porokeratosis bilateral hallux x2 -I explained to patient the etiology of porokeratosis versus treatment options were discussed.  Given the amount of pain that is having I believe patient will benefit from aggressive debridement of the lesion.  Using chisel blade and handle the lesions were debrided down to healthy striated tissue.  No complication noted.  No pinpoint bleeding noted  Onychomycosis with pain  -Nails palliatively debrided as below. -Educated on self-care  Procedure: Nail Debridement Rationale: pain  Type of Debridement: manual, sharp debridement. Instrumentation: Nail nipper, rotary burr. Number of Nails: 10  Procedures and Treatment: Consent by patient was obtained for treatment procedures. The patient understood the discussion of treatment and procedures well. All questions were answered thoroughly reviewed. Debridement of mycotic and hypertrophic toenails, 1 through 5 bilateral and clearing of subungual debris. No ulceration, no infection noted.  Return Visit-Office Procedure: Patient instructed to return to the office for a follow up visit 3 months for continued evaluation and treatment.  [name], DPM    No follow-ups on file.

## 2021-02-22 DIAGNOSIS — Z4789 Encounter for other orthopedic aftercare: Secondary | ICD-10-CM | POA: Diagnosis not present

## 2021-02-22 DIAGNOSIS — M25631 Stiffness of right wrist, not elsewhere classified: Secondary | ICD-10-CM | POA: Diagnosis not present

## 2021-02-22 DIAGNOSIS — M25531 Pain in right wrist: Secondary | ICD-10-CM | POA: Diagnosis not present

## 2021-05-21 ENCOUNTER — Ambulatory Visit: Payer: Medicare Other | Admitting: Podiatry

## 2021-06-30 ENCOUNTER — Other Ambulatory Visit: Payer: Self-pay

## 2021-06-30 ENCOUNTER — Ambulatory Visit: Payer: Medicare Other | Admitting: Podiatry

## 2021-06-30 DIAGNOSIS — M79675 Pain in left toe(s): Secondary | ICD-10-CM

## 2021-06-30 DIAGNOSIS — M79674 Pain in right toe(s): Secondary | ICD-10-CM | POA: Diagnosis not present

## 2021-06-30 DIAGNOSIS — B351 Tinea unguium: Secondary | ICD-10-CM | POA: Diagnosis not present

## 2021-06-30 NOTE — Progress Notes (Signed)
°  Subjective:  Patient ID: Peter Gallegos, male    DOB: Mar 12, 1933,  MRN: [id number]  Chief Complaint  Patient presents with   Nail Problem    Nail trim    88 y.o. male returns for the above complaint.  Patient presents with thickened elongated dystrophic onychomycosis bilaterally x10.  They are painful to palpation.  Patient states that they are very hard to and painful to ambulate on.  Patient would like to have the repair done.  He has no other acute concerns.  Objective:  There were no vitals filed for this visit. Podiatric Exam: Vascular: dorsalis pedis and posterior tibial pulses are palpable bilateral. Capillary return is immediate. Temperature gradient is WNL. Skin turgor WNL  Sensorium: Normal Semmes Weinstein monofilament test. Normal tactile sensation bilaterally. Nail Exam: Pt has thick disfigured discolored nails with subungual debris noted bilateral entire nail hallux through fifth toenails.  Pain on palpation to the nails. Ulcer Exam: There is no evidence of ulcer or pre-ulcerative changes or infection. Orthopedic Exam: Muscle tone and strength are WNL. No limitations in general ROM. No crepitus or effusions noted. HAV  B/L.  Hammer toes 2-5  B/L. Skin: Hyperkeratotic lesion noted to bilateral medial aspect of the hallux.  No pinpoint bleeding noted.  Pain on palpation to the lesion. No infection or ulcers.  Dry skin without fissure or pain to the bilateral lower extremity    Assessment & Plan:   1. Pain due to onychomycosis of toenails of both feet       Patient was evaluated and treated and all questions answered.  Xerosis bilateral lower extremity -I explained to the patient the etiology of xerosis and various treatment options were extensively discussed.  I explained to the patient the importance of maintaining moisturization of the skin with application of over-the-counter lotion such as Eucerin or Luciderm.  I have asked the patient to apply this twice a day.   If unable to resolve patient will benefit from prescription lotion.  Porokeratosis bilateral hallux x2 -I explained to patient the etiology of porokeratosis versus treatment options were discussed.  Given the amount of pain that is having I believe patient will benefit from aggressive debridement of the lesion.  Using chisel blade and handle the lesions were debrided down to healthy striated tissue.  No complication noted.  No pinpoint bleeding noted  Onychomycosis with pain  -Nails palliatively debrided as below. -Educated on self-care  Procedure: Nail Debridement Rationale: pain  Type of Debridement: manual, sharp debridement. Instrumentation: Nail nipper, rotary burr. Number of Nails: 10  Procedures and Treatment: Consent by patient was obtained for treatment procedures. The patient understood the discussion of treatment and procedures well. All questions were answered thoroughly reviewed. Debridement of mycotic and hypertrophic toenails, 1 through 5 bilateral and clearing of subungual debris. No ulceration, no infection noted.  Return Visit-Office Procedure: Patient instructed to return to the office for a follow up visit 3 months for continued evaluation and treatment.  [name], DPM    No follow-ups on file.

## 2021-07-06 DIAGNOSIS — I10 Essential (primary) hypertension: Secondary | ICD-10-CM | POA: Diagnosis not present

## 2021-07-06 DIAGNOSIS — K219 Gastro-esophageal reflux disease without esophagitis: Secondary | ICD-10-CM | POA: Diagnosis not present

## 2021-07-06 DIAGNOSIS — E782 Mixed hyperlipidemia: Secondary | ICD-10-CM | POA: Diagnosis not present

## 2021-07-06 DIAGNOSIS — K5730 Diverticulosis of large intestine without perforation or abscess without bleeding: Secondary | ICD-10-CM | POA: Diagnosis not present

## 2021-08-31 DIAGNOSIS — I10 Essential (primary) hypertension: Secondary | ICD-10-CM | POA: Diagnosis not present

## 2021-09-03 DIAGNOSIS — Z01818 Encounter for other preprocedural examination: Secondary | ICD-10-CM | POA: Diagnosis not present

## 2021-09-03 DIAGNOSIS — N1831 Chronic kidney disease, stage 3a: Secondary | ICD-10-CM | POA: Diagnosis not present

## 2021-09-03 DIAGNOSIS — Z0000 Encounter for general adult medical examination without abnormal findings: Secondary | ICD-10-CM | POA: Diagnosis not present

## 2021-09-03 DIAGNOSIS — Z8673 Personal history of transient ischemic attack (TIA), and cerebral infarction without residual deficits: Secondary | ICD-10-CM | POA: Diagnosis not present

## 2021-09-03 DIAGNOSIS — H35039 Hypertensive retinopathy, unspecified eye: Secondary | ICD-10-CM | POA: Diagnosis not present

## 2021-09-03 DIAGNOSIS — K219 Gastro-esophageal reflux disease without esophagitis: Secondary | ICD-10-CM | POA: Diagnosis not present

## 2021-09-03 DIAGNOSIS — H269 Unspecified cataract: Secondary | ICD-10-CM | POA: Diagnosis not present

## 2021-09-03 DIAGNOSIS — I10 Essential (primary) hypertension: Secondary | ICD-10-CM | POA: Diagnosis not present

## 2021-09-03 DIAGNOSIS — E782 Mixed hyperlipidemia: Secondary | ICD-10-CM | POA: Diagnosis not present

## 2021-09-03 DIAGNOSIS — M8588 Other specified disorders of bone density and structure, other site: Secondary | ICD-10-CM | POA: Diagnosis not present

## 2021-09-20 ENCOUNTER — Inpatient Hospital Stay: Payer: Medicare Other

## 2021-09-20 ENCOUNTER — Ambulatory Visit: Payer: Medicare Other | Admitting: Cardiology

## 2021-09-20 ENCOUNTER — Encounter: Payer: Self-pay | Admitting: Cardiology

## 2021-09-20 VITALS — BP 116/71 | HR 78 | Temp 97.3°F | Resp 16 | Ht 66.0 in | Wt 164.0 lb

## 2021-09-20 DIAGNOSIS — I459 Conduction disorder, unspecified: Secondary | ICD-10-CM

## 2021-09-20 DIAGNOSIS — Z01818 Encounter for other preprocedural examination: Secondary | ICD-10-CM | POA: Diagnosis not present

## 2021-09-20 DIAGNOSIS — E782 Mixed hyperlipidemia: Secondary | ICD-10-CM | POA: Diagnosis not present

## 2021-09-20 DIAGNOSIS — Z8679 Personal history of other diseases of the circulatory system: Secondary | ICD-10-CM

## 2021-09-20 DIAGNOSIS — Z8673 Personal history of transient ischemic attack (TIA), and cerebral infarction without residual deficits: Secondary | ICD-10-CM | POA: Diagnosis not present

## 2021-09-20 DIAGNOSIS — I10 Essential (primary) hypertension: Secondary | ICD-10-CM | POA: Diagnosis not present

## 2021-09-20 DIAGNOSIS — I440 Atrioventricular block, first degree: Secondary | ICD-10-CM

## 2021-09-20 NOTE — Progress Notes (Signed)
? ?ID:  Peter Gallegos, DOB 05/25/1933, MRN [id number] ? ?PCP:  Deland Pretty, MD  ?Cardiologist:  Rex Kras, DO, [facility] (established care 09/20/2021) ? ?REASON FOR CONSULT: Pre-op and abnormal EKG.  ? ?REQUESTING PHYSICIAN:  ?Deland Pretty, MD ?[place],  [place] [address] ? ?Chief Complaint  ?Patient presents with  ? Medical Clearance  ?  Cataract both eyes  ? New Patient (Initial Visit)  ? ? ?HPI  ?Peter Gallegos is a 88 y.o. Caucasian male whose past medical history and cardiovascular risk factors include: Hypertension, history of TIA, family history of heart disease, history of alcohol abuse, hyperlipidemia, subarachnoid hematoma, PVCs, first-degree AV block, history of PACs, advanced age ? ? ?He is referred to the office at the request of Deland Pretty, MD for evaluation of preoperative risk stratification abnormal EKG. ? ?This patient is accompanied in the office by his spouse. Peter Gallegos provides verbal consent with regards to having her present during today's encounter. ? ?Patient was being considered for bilateral cataract surgery and during the preoperative work-up underwent an EKG I was notified of an undetermined rhythm and therefore referred to cardiology for further evaluation and management.  Patient denies near-syncope or syncopal event.  But does feel tired, fatigued, at times lightheaded and dizziness when he works underneath the sink.  Denies anginal discomfort or heart failure symptoms. ? ?FUNCTIONAL STATUS: ?No structured exercise program or daily routine.  ? ?ALLERGIES: ?No Known Allergies ? ?MEDICATION LIST PRIOR TO VISIT: ?Current Meds  ?Medication Sig  ? alendronate (FOSAMAX) 70 MG tablet Take 70 mg by mouth once a week.  ? atorvastatin (LIPITOR) 40 MG tablet Take 40 mg by mouth daily.  ? buPROPion (WELLBUTRIN XL) 300 MG 24 hr tablet Take 300 mg by mouth daily.  ? CALCIUM PO Take 1 tablet by mouth daily.  ? Cholecalciferol (VITAMIN D) 125 MCG (5000  UT) CAPS Take 5,000 Units by mouth daily.  ? dipyridamole-aspirin (AGGRENOX) 200-25 MG 12hr capsule Take 1 capsule by mouth 2 (two) times daily.  ? Multiple Vitamin (MULTIVITAMIN WITH MINERALS) TABS tablet Take 1 tablet by mouth daily.  ? potassium chloride (KLOR-CON) 10 MEQ tablet Take 10 mEq by mouth daily.  ? sertraline (ZOLOFT) 50 MG tablet Take 50 mg by mouth daily.  ? triamterene-hydrochlorothiazide (MAXZIDE-25) 37.5-25 MG per tablet Take 1 tablet by mouth daily.  ?  ? ?PAST MEDICAL HISTORY: ?Past Medical History:  ?Diagnosis Date  ? Anxiety and depression   ? Depression   ? Diverticulosis   ? Family history of coronary artery disease   ? GERD (gastroesophageal reflux disease)   ? History of arm fracture   ? Rt arm, LT elbow fracture  ? History of colon polyps   ? History of ETOH abuse   ? Quit in 1980  ? Hypercholesterolemia   ? Hypertension   ? PONV (postoperative nausea and vomiting)   ? TIA (transient ischemic attack) 12/2005  ? ? ?PAST SURGICAL HISTORY: ?Past Surgical History:  ?Procedure Laterality Date  ? COLONOSCOPY    ? OPEN REDUCTION INTERNAL FIXATION (ORIF) DISTAL PHALANX Left 11/21/2013  ? Procedure: OPEN REDUCTION INTERNAL FIXATION (ORIF) DISTAL PHALANX;  Surgeon: Linna Hoff, MD;  Location: [place];  Service: Orthopedics;  Laterality: Left;  ? ORIF WRIST FRACTURE Right 11/25/2020  ? Procedure: OPEN REDUCTION INTERNAL FIXATION WRIST FRACTURE;  Surgeon: Roseanne Kaufman, MD;  Location: [place];  Service: Orthopedics;  Laterality: Right;  ? ? ?FAMILY HISTORY: ?The patient  family history includes Heart disease in his father and mother. ? ?SOCIAL HISTORY:  ?The patient  reports that he quit smoking about 37 years ago. His smoking use included pipe. He has never used smokeless tobacco. He reports that he does not drink alcohol and does not use drugs. ? ?REVIEW OF SYSTEMS: ?Review of Systems  ?Constitutional: Positive for malaise/fatigue.  ?Cardiovascular:  Positive for dyspnea on exertion. Negative for  chest pain, leg swelling, near-syncope, orthopnea, palpitations, paroxysmal nocturnal dyspnea and syncope.  ?Respiratory:  Positive for shortness of breath.   ?Neurological:  Positive for dizziness and light-headedness.  ? ?PHYSICAL EXAM: ? ?  09/20/2021  ? 10:36 AM 11/26/2020  ?  7:26 AM 11/26/2020  ?  3:00 AM  ?Vitals with BMI  ?Height '5\' 6"'$     ?Weight 164 lbs    ?BMI 26.48    ?Systolic 503 546 568  ?Diastolic 71 82 65  ?Pulse 78 68 62  ? ? ?CONSTITUTIONAL: Appears older than stated age, hemodynamically stable, hard of hearing, well-nourished. No acute distress.  ?SKIN: Skin is warm and dry. No rash noted. No cyanosis. No pallor. No jaundice ?HEAD: Normocephalic and atraumatic.  ?EYES: No scleral icterus ?MOUTH/THROAT: Moist oral membranes.  ?NECK: No JVD present. No thyromegaly noted. No carotid bruits  ?LYMPHATIC: No visible cervical adenopathy.  ?CHEST Normal respiratory effort. No intercostal retractions  ?LUNGS: Clear to auscultation bilaterally.  No stridor. No wheezes. No rales.  ?CARDIOVASCULAR: Bradycardic, positive S1-S2, no murmurs rubs or gallops appreciated.   ?ABDOMINAL: soft, nontender, distended, positive bowel sounds in all 4 quadrants, no apparent ascites.  ?EXTREMITIES: No peripheral edema, warm to touch,  ?HEMATOLOGIC: No significant bruising ?NEUROLOGIC: Oriented to person, place, and time. Nonfocal. Normal muscle tone.  ?PSYCHIATRIC: Normal mood and affect. Normal behavior. Cooperative ? ?CARDIAC DATABASE: ?EKG: ?09/20/2021: Normal sinus rhythm, prolonged first-degree AV block, without underlying injury pattern. ? ?Echocardiogram: ?No results found for this or any previous visit from the past 1095 days. ?  ? ?Stress Testing: ?No results found for this or any previous visit from the past 1095 days. ? ? ?Heart Catheterization: ?None ? ?LABORATORY DATA: ?Name Result Date Reference Range  ?Lipid Panel   2021-08-31    ?Cholesterol 153   <200  ?Cholesterol / HDL Ratio 2.10   0.00-4.99  ?HDL Cholesterol  73   >39  ?LDL Cholesterol (Calculation) 47   <130  ?LDL/HDL Ratio 0.6   <3.3  ?Non-HDL Cholesterol 80   <130  ?Triglycerides 167   <150  ?CBC With Platelet And Differential RS In-house   2021-08-31    ?Absolute Basophils 0.0   0.0-0.1  ?Absolute Eosinophils 0.1   0.0-0.6  ?Absolute Lymphocytes 2.5   0.9-3.6  ?Absolute Monocytes 0.6   0.3-1.0  ?Absolute Neutrophils 3.9   2.0-8.2  ?Basophils Automated 0.3   0.0-2.0  ?Eosinophils Automated 2.0   0.0-7.0  ?Hematocrit 47.2   37.0-47.0  ?Hemoglobin 15.7   13.5-18.0  ?Lymphocytes Automated 34.9   20.5-51.1  ?MCH 30.1   26.0-33.0  ?MCHC 33.3   32.0-36.0  ?MCV 90.4   80.0-100.0  ?Monocytes Automated 8.5   5.0-12.0  ?Neutrophils Automated 54.3   42.2-75.2  ?Platelet Count 195   140-400  ?RBC 5.22   4.20-5.40  ?RDW 46.1      ?WBC 7.1   4.5-11.0  ?Comprehensive Metabolic Panel RS In-house   2021-08-31    ?Albumin 3.8   3.4-5.0  ?Albumin/Globulin Ratio 1.3   1.1-2.5  ?Alkaline Phosphatase 54   25-150  ?  ALT (SGPT) 34   <6-78  ?AST (SGOT) 22   0-40  ?Bilirubin, Total 0.7   0.2-1.0  ?BUN 23   7-18  ?BUN/Creatinine Ratio 14.9   11.0-26.0  ?Calcium 9.4   8.5-10.1  ?Chloride 102   98-107  ?CO2 31   21-32  ?Creatinine 1.54   0.70-1.30  ?GFR/Black 46   >59  ?GFR/White 40   >59  ?Globulin, Calculated 2.9   1.5-4.6  ?Glucose 95   74-106  ?Potassium 3.7   3.5-5.1  ?Protein 6.7   6.4-8.2  ?Sodium 142   136-145  ? ? ? ?IMPRESSION: ? ?  ICD-10-CM   ?1. Pre-op evaluation  Z01.818 EKG 12-Lead  ?  PCV ECHOCARDIOGRAM COMPLETE  ?  LONG TERM MONITOR (3-14 DAYS)  ?  ?2. Conduction disorder of the heart  I45.9 PCV ECHOCARDIOGRAM COMPLETE  ?  LONG TERM MONITOR (3-14 DAYS)  ?  TSH  ?  ?3. First degree AV block  I44.0 PCV ECHOCARDIOGRAM COMPLETE  ?  LONG TERM MONITOR (3-14 DAYS)  ?  ?4. Benign hypertension  I10   ?  ?5. Hx of transient ischemic attack (TIA)  Z86.73   ?  ?6. Mixed hyperlipidemia  E78.2   ?  ?7. Hx of subarachnoid hemorrhage  Z86.79   ?  ?  ? ?RECOMMENDATIONS: ?Lyon Dumont is a  88 y.o. Caucasian male whose past medical history and cardiac risk factors include: Hypertension, history of TIA, family history of heart disease, history of alcohol abuse, hyperlipidemia, subarachnoid

## 2021-09-28 DIAGNOSIS — M810 Age-related osteoporosis without current pathological fracture: Secondary | ICD-10-CM | POA: Diagnosis not present

## 2021-09-28 DIAGNOSIS — E785 Hyperlipidemia, unspecified: Secondary | ICD-10-CM | POA: Diagnosis not present

## 2021-09-28 DIAGNOSIS — Z8673 Personal history of transient ischemic attack (TIA), and cerebral infarction without residual deficits: Secondary | ICD-10-CM | POA: Diagnosis not present

## 2021-09-28 DIAGNOSIS — R1310 Dysphagia, unspecified: Secondary | ICD-10-CM | POA: Diagnosis not present

## 2021-09-28 DIAGNOSIS — I10 Essential (primary) hypertension: Secondary | ICD-10-CM | POA: Diagnosis not present

## 2021-09-29 ENCOUNTER — Encounter: Payer: Self-pay | Admitting: Podiatry

## 2021-09-29 ENCOUNTER — Ambulatory Visit: Payer: Medicare Other | Admitting: Podiatry

## 2021-09-29 DIAGNOSIS — M79674 Pain in right toe(s): Secondary | ICD-10-CM

## 2021-09-29 DIAGNOSIS — M79675 Pain in left toe(s): Secondary | ICD-10-CM | POA: Diagnosis not present

## 2021-09-29 DIAGNOSIS — B351 Tinea unguium: Secondary | ICD-10-CM

## 2021-09-29 NOTE — Progress Notes (Signed)
?  Subjective:  ?Patient ID: Peter Gallegos, male    DOB: 1933-05-20,  MRN: [id number] ? ?Chief Complaint  ?Patient presents with  ? Nail Problem  ?  Nail trim   ? ?88 y.o. male returns for the above complaint.  Patient presents with thickened elongated dystrophic onychomycosis bilaterally x10.  They are painful to palpation.  Patient states that they are very hard to and painful to ambulate on.  Patient would like to have the repair done.  He has no other acute concerns. ? ?Objective:  ?There were no vitals filed for this visit. ?Podiatric Exam: ?Vascular: dorsalis pedis and posterior tibial pulses are palpable bilateral. Capillary return is immediate. Temperature gradient is WNL. Skin turgor WNL  ?Sensorium: Normal Semmes Weinstein monofilament test. Normal tactile sensation bilaterally. ?Nail Exam: Pt has thick disfigured discolored nails with subungual debris noted bilateral entire nail hallux through fifth toenails.  Pain on palpation to the nails. ?Ulcer Exam: There is no evidence of ulcer or pre-ulcerative changes or infection. ?Orthopedic Exam: Muscle tone and strength are WNL. No limitations in general ROM. No crepitus or effusions noted. HAV  B/L.  Hammer toes 2-5  B/L. ?Skin: Hyperkeratotic lesion noted to bilateral medial aspect of the hallux.  No pinpoint bleeding noted.  Pain on palpation to the lesion. No infection or ulcers.  Dry skin without fissure or pain to the bilateral lower extremity ? ? ? ?Assessment & Plan:  ? ?No diagnosis found. ? ? ? ? ?Patient was evaluated and treated and all questions answered. ? ?Xerosis bilateral lower extremity ?-I explained to the patient the etiology of xerosis and various treatment options were extensively discussed.  I explained to the patient the importance of maintaining moisturization of the skin with application of over-the-counter lotion such as Eucerin or Luciderm.  I have asked the patient to apply this twice a day.  If unable to resolve patient will  benefit from prescription lotion. ? ?Porokeratosis bilateral hallux x2 ?-I explained to patient the etiology of porokeratosis versus treatment options were discussed.  Given the amount of pain that is having I believe patient will benefit from aggressive debridement of the lesion.  Using chisel blade and handle the lesions were debrided down to healthy striated tissue.  No complication noted.  No pinpoint bleeding noted ? ?Onychomycosis with pain  ?-Nails palliatively debrided as below. ?-Educated on self-care ? ?Procedure: Nail Debridement ?Rationale: pain  ?Type of Debridement: manual, sharp debridement. ?Instrumentation: Nail nipper, rotary burr. ?Number of Nails: 10 ? ?Procedures and Treatment: Consent by patient was obtained for treatment procedures. The patient understood the discussion of treatment and procedures well. All questions were answered thoroughly reviewed. Debridement of mycotic and hypertrophic toenails, 1 through 5 bilateral and clearing of subungual debris. No ulceration, no infection noted.  ?Return Visit-Office Procedure: Patient instructed to return to the office for a follow up visit 3 months for continued evaluation and treatment. ? ?[name], DPM ?  ? ?No follow-ups on file. ? ?

## 2021-10-01 DIAGNOSIS — I459 Conduction disorder, unspecified: Secondary | ICD-10-CM | POA: Diagnosis not present

## 2021-10-01 DIAGNOSIS — I440 Atrioventricular block, first degree: Secondary | ICD-10-CM | POA: Diagnosis not present

## 2021-10-01 DIAGNOSIS — Z01818 Encounter for other preprocedural examination: Secondary | ICD-10-CM | POA: Diagnosis not present

## 2021-10-04 ENCOUNTER — Ambulatory Visit: Payer: Medicare Other

## 2021-10-04 DIAGNOSIS — I459 Conduction disorder, unspecified: Secondary | ICD-10-CM

## 2021-10-04 DIAGNOSIS — Z01818 Encounter for other preprocedural examination: Secondary | ICD-10-CM

## 2021-10-04 DIAGNOSIS — I440 Atrioventricular block, first degree: Secondary | ICD-10-CM

## 2021-10-12 ENCOUNTER — Ambulatory Visit: Payer: Medicare Other

## 2021-10-21 ENCOUNTER — Inpatient Hospital Stay: Payer: Medicare Other | Admitting: Cardiology

## 2021-10-21 ENCOUNTER — Encounter: Payer: Self-pay | Admitting: Cardiology

## 2021-10-21 VITALS — BP 115/69 | HR 80 | Temp 98.0°F | Resp 17 | Ht 66.0 in | Wt 166.0 lb

## 2021-10-21 DIAGNOSIS — I350 Nonrheumatic aortic (valve) stenosis: Secondary | ICD-10-CM | POA: Diagnosis not present

## 2021-10-21 DIAGNOSIS — Z8679 Personal history of other diseases of the circulatory system: Secondary | ICD-10-CM

## 2021-10-21 DIAGNOSIS — E782 Mixed hyperlipidemia: Secondary | ICD-10-CM

## 2021-10-21 DIAGNOSIS — I459 Conduction disorder, unspecified: Secondary | ICD-10-CM | POA: Diagnosis not present

## 2021-10-21 DIAGNOSIS — Z8673 Personal history of transient ischemic attack (TIA), and cerebral infarction without residual deficits: Secondary | ICD-10-CM

## 2021-10-21 DIAGNOSIS — I440 Atrioventricular block, first degree: Secondary | ICD-10-CM

## 2021-10-21 DIAGNOSIS — I10 Essential (primary) hypertension: Secondary | ICD-10-CM

## 2021-10-21 DIAGNOSIS — Z01818 Encounter for other preprocedural examination: Secondary | ICD-10-CM

## 2021-10-21 NOTE — Progress Notes (Signed)
ID:  Peter Gallegos, DOB Mar 14, 1933, MRN [id number]  PCP:  Deland Pretty, MD  Cardiologist:  Rex Kras, DO, [facility] (established care 09/20/2021)  Date: 10/21/21 Last Office Visit: 09/20/2021  Chief Complaint  Patient presents with   Follow-up    3 weeks   Pre-op evaluation   Results    3 weeks    HPI  Peter Gallegos is a 88 y.o. Caucasian male whose past medical history and cardiovascular risk factors include: Aortic stenosis, hypertension, history of TIA, family history of heart disease, history of alcohol abuse, hyperlipidemia, subarachnoid hematoma, PVCs, first-degree AV block, history of PACs, advanced age  Patient was referred to the practice for evaluation of an abnormal EKG which was obtained as part of preoperative work-up for upcoming cataract surgery.  EKG performed at last office visit noted likely sinus rhythm with prolonged first-degree AV block.  Given the suspicion for underlying conduction disease he underwent an extended Holter monitor to evaluate for the same.  Extended Holter monitor results reviewed patient has good chronotropic competence without any evidence of high degree AV block or pauses greater than 3 seconds. Echocardiogram notes preserved LVEF and moderate aortic stenosis.  Additional details noted below for further reference.  Patient denies any near-syncope or syncopal events.  Clinically denies angina pectoris or heart failure symptoms.  I informed both the patient and his wife that he would be considered acceptable risk for upcoming cataract surgery as it is at low risk procedure.  However, both patient and wife are requesting exercise treadmill stress test prior to upcoming cataract surgery.  FUNCTIONAL STATUS: No structured exercise program or daily routine.   ALLERGIES: No Known Allergies  MEDICATION LIST PRIOR TO VISIT: Current Meds  Medication Sig   alendronate (FOSAMAX) 70 MG tablet Take 70 mg by mouth once a week.   atorvastatin  (LIPITOR) 40 MG tablet Take 40 mg by mouth daily.   buPROPion (WELLBUTRIN XL) 300 MG 24 hr tablet Take 300 mg by mouth daily.   CALCIUM PO Take 1 tablet by mouth daily.   Cholecalciferol (VITAMIN D) 125 MCG (5000 UT) CAPS Take 5,000 Units by mouth daily.   dipyridamole-aspirin (AGGRENOX) 200-25 MG 12hr capsule Take 1 capsule by mouth 2 (two) times daily.   Multiple Vitamin (MULTIVITAMIN WITH MINERALS) TABS tablet Take 1 tablet by mouth daily.   potassium chloride (KLOR-CON) 10 MEQ tablet Take 10 mEq by mouth daily.   sertraline (ZOLOFT) 50 MG tablet Take 50 mg by mouth daily.   triamterene-hydrochlorothiazide (MAXZIDE-25) 37.5-25 MG per tablet Take 1 tablet by mouth daily.     PAST MEDICAL HISTORY: Past Medical History:  Diagnosis Date   Anxiety and depression    Depression    Diverticulosis    Family history of coronary artery disease    GERD (gastroesophageal reflux disease)    History of arm fracture    Rt arm, LT elbow fracture   History of colon polyps    History of ETOH abuse    Quit in 1980   Hypercholesterolemia    Hypertension    PONV (postoperative nausea and vomiting)    TIA (transient ischemic attack) 12/2005    PAST SURGICAL HISTORY: Past Surgical History:  Procedure Laterality Date   COLONOSCOPY     OPEN REDUCTION INTERNAL FIXATION (ORIF) DISTAL PHALANX Left 11/21/2013   Procedure: OPEN REDUCTION INTERNAL FIXATION (ORIF) DISTAL PHALANX;  Surgeon: Linna Hoff, MD;  Location: [place];  Service: Orthopedics;  Laterality: Left;   ORIF  WRIST FRACTURE Right 11/25/2020   Procedure: OPEN REDUCTION INTERNAL FIXATION WRIST FRACTURE;  Surgeon: Roseanne Kaufman, MD;  Location: [place];  Service: Orthopedics;  Laterality: Right;    FAMILY HISTORY: The patient family history includes Heart disease in his father and mother.  SOCIAL HISTORY:  The patient  reports that he quit smoking about 37 years ago. His smoking use included pipe. He has never used smokeless tobacco. He  reports that he does not drink alcohol and does not use drugs.  REVIEW OF SYSTEMS: Review of Systems  Constitutional: Negative for malaise/fatigue.  Cardiovascular:  Negative for chest pain, dyspnea on exertion, leg swelling, near-syncope, orthopnea, palpitations, paroxysmal nocturnal dyspnea and syncope.  Respiratory:  Negative for shortness of breath.   Neurological:  Negative for dizziness and light-headedness.   PHYSICAL EXAM:    10/21/2021    1:25 PM 09/20/2021   10:36 AM 11/26/2020    7:26 AM  Vitals with BMI  Height '5\' 6"'$  '5\' 6"'$    Weight 166 lbs 164 lbs   BMI 62.95 28.41   Systolic 324 401 027  Diastolic 69 71 82  Pulse 80 78 68    CONSTITUTIONAL: Appears older than stated age, hemodynamically stable, hard of hearing, well-nourished. No acute distress.  SKIN: Skin is warm and dry. No rash noted. No cyanosis. No pallor. No jaundice HEAD: Normocephalic and atraumatic.  EYES: No scleral icterus MOUTH/THROAT: Moist oral membranes.  NECK: No JVD present. No thyromegaly noted. No carotid bruits  CHEST Normal respiratory effort. No intercostal retractions  LUNGS: Clear to auscultation bilaterally.  No stridor. No wheezes. No rales.  CARDIOVASCULAR: Bradycardic, positive O5-D6, soft systolic ejection murmur, no rubs or gallops appreciated.   ABDOMINAL: soft, nontender, distended, positive bowel sounds in all 4 quadrants, no apparent ascites.  EXTREMITIES: No peripheral edema, warm to touch,  HEMATOLOGIC: No significant bruising NEUROLOGIC: Oriented to person, place, and time. Nonfocal. Normal muscle tone.  PSYCHIATRIC: Normal mood and affect. Normal behavior. Cooperative  CARDIAC DATABASE: EKG: 09/20/2021: Normal sinus rhythm, prolonged first-degree AV block, without underlying injury pattern.  Echocardiogram: 10/12/2021:  Left ventricle cavity is normal in size. Normal left ventricular wall thickness. Normal global wall motion. Normal LV systolic function with EF 61%. Doppler  evidence of grade I (impaired) diastolic dysfunction, normal LAP.  Left atrial cavity is moderately dilated.  Trileaflet aortic valve with mild aortic valve leaflet calcification.    Moderate aortic valve stenosis. No regurgitation. Vmax 2.2 m/sec, mean PG 11 mmHg, AVA 1.1 cm by continuity equation.  Mild (Grade I) mitral regurgitation.  IVC not seen.   Stress Testing: No results found for this or any previous visit from the past 1095 days.   Heart Catheterization: None  Cardiac monitor (Zio Patch): Patch Wear Time:  6 days and 23 hours  Dominant rhythm sinus with first-degree AV block. Heart rate 30-102 bpm.  Avg HR 68 bpm. No atrial fibrillation, supraventricular tachycardia, ventricular tachycardia, high grade AV block, pauses (3 seconds or longer). Minimum HR 30 bpm, asymptomatic, on 09/26/2021, at 11:20 PM, sinus with second-degree type I AV block. Total ventricular ectopic burden <1%. Total supraventricular ectopic burden 0%. Patient triggered events: 0.  LABORATORY DATA: Name Result Date Reference Range  Lipid Panel   2021-08-31    Cholesterol 153   <200  Cholesterol / HDL Ratio 2.10   0.00-4.99  HDL Cholesterol 73   >39  LDL Cholesterol (Calculation) 47   <130  LDL/HDL Ratio 0.6   <3.3  Non-HDL Cholesterol 80   <  130  Triglycerides 167   <150  CBC With Platelet And Differential RS In-house   2021-08-31    Absolute Basophils 0.0   0.0-0.1  Absolute Eosinophils 0.1   0.0-0.6  Absolute Lymphocytes 2.5   0.9-3.6  Absolute Monocytes 0.6   0.3-1.0  Absolute Neutrophils 3.9   2.0-8.2  Basophils Automated 0.3   0.0-2.0  Eosinophils Automated 2.0   0.0-7.0  Hematocrit 47.2   37.0-47.0  Hemoglobin 15.7   13.5-18.0  Lymphocytes Automated 34.9   20.5-51.1  MCH 30.1   26.0-33.0  MCHC 33.3   32.0-36.0  MCV 90.4   80.0-100.0  Monocytes Automated 8.5   5.0-12.0  Neutrophils Automated 54.3   42.2-75.2  Platelet Count 195   140-400  RBC 5.22   4.20-5.40  RDW 46.1      WBC  7.1   4.5-11.0  Comprehensive Metabolic Panel RS In-house   2021-08-31    Albumin 3.8   3.4-5.0  Albumin/Globulin Ratio 1.3   1.1-2.5  Alkaline Phosphatase 54   25-150  ALT (SGPT) 34   <6-78  AST (SGOT) 22   0-40  Bilirubin, Total 0.7   0.2-1.0  BUN 23   7-18  BUN/Creatinine Ratio 14.9   11.0-26.0  Calcium 9.4   8.5-10.1  Chloride 102   98-107  CO2 31   21-32  Creatinine 1.54   0.70-1.30  GFR/Black 46   >59  GFR/White 40   >59  Globulin, Calculated 2.9   1.5-4.6  Glucose 95   74-106  Potassium 3.7   3.5-5.1  Protein 6.7   6.4-8.2  Sodium 142   136-145     IMPRESSION:    ICD-10-CM   1. Pre-op evaluation  Z01.818 LONG TERM MONITOR (3-14 DAYS)    PCV CARDIAC STRESS TEST    2. First degree AV block  I44.0 LONG TERM MONITOR (3-14 DAYS)    3. Benign hypertension  I10     4. Nonrheumatic aortic valve stenosis  I35.0 ECHOCARDIOGRAM COMPLETE    5. Hx of transient ischemic attack (TIA)  Z86.73     6. Mixed hyperlipidemia  E78.2     7. Hx of subarachnoid hemorrhage  Z86.79        RECOMMENDATIONS: Akshay Spang is a 88 y.o. Caucasian male whose past medical history and cardiac risk factors include: Hypertension, history of TIA, family history of heart disease, history of alcohol abuse, hyperlipidemia, subarachnoid hematoma, PVCs, first-degree AV block, history of PACs, advanced age.  Pre-op evaluation Being considered for bilateral cataract surgery. LVEF is preserved, moderate aortic stenosis, and extended Holter monitor does not illustrate any significant conduction disease. Patient is deemed acceptable risk for upcoming noncardiac surgery. However, patient still remains reluctant and therefore will proceed with exercise treadmill stress test for further evaluation and management.  First degree AV block Currently not on AV nodal blocking agents. Does not endorse near syncope or syncopal event. Patient was noted to have Wenkebach physiology on his extended Holter  monitor-asymptomatic.  Benign hypertension Office blood pressures are very well controlled. Medications reconciled. Currently managed by primary care provider.  Nonrheumatic aortic valve stenosis Newly discovered May 2023 Currently asymptomatic. Repeat echo in 1 year to evaluate disease progression. Patient is asked to seek medical attention if he has symptoms of heart failure, chest pain, or near syncope/syncope.  Both patient and wife are agreeable and voiced understanding.  Mixed hyperlipidemia Currently on atorvastatin.   He denies myalgia or other side effects. Most recent lipids dated March  2023, independently reviewed as noted above. Currently managed by primary care provider.  FINAL MEDICATION LIST END OF ENCOUNTER: No orders of the defined types were placed in this encounter.   There are no discontinued medications.   Current Outpatient Medications:    alendronate (FOSAMAX) 70 MG tablet, Take 70 mg by mouth once a week., Disp: , Rfl:    atorvastatin (LIPITOR) 40 MG tablet, Take 40 mg by mouth daily., Disp: , Rfl:    buPROPion (WELLBUTRIN XL) 300 MG 24 hr tablet, Take 300 mg by mouth daily., Disp: , Rfl:    CALCIUM PO, Take 1 tablet by mouth daily., Disp: , Rfl:    Cholecalciferol (VITAMIN D) 125 MCG (5000 UT) CAPS, Take 5,000 Units by mouth daily., Disp: , Rfl:    dipyridamole-aspirin (AGGRENOX) 200-25 MG 12hr capsule, Take 1 capsule by mouth 2 (two) times daily., Disp: , Rfl:    Multiple Vitamin (MULTIVITAMIN WITH MINERALS) TABS tablet, Take 1 tablet by mouth daily., Disp: , Rfl:    potassium chloride (KLOR-CON) 10 MEQ tablet, Take 10 mEq by mouth daily., Disp: , Rfl:    sertraline (ZOLOFT) 50 MG tablet, Take 50 mg by mouth daily., Disp: , Rfl:    triamterene-hydrochlorothiazide (MAXZIDE-25) 37.5-25 MG per tablet, Take 1 tablet by mouth daily., Disp: , Rfl:   Orders Placed This Encounter  Procedures   PCV CARDIAC STRESS TEST   ECHOCARDIOGRAM COMPLETE    There are  no Patient Instructions on file for this visit.   --Continue cardiac medications as reconciled in final medication list. --Return in about 6 months (around 04/23/2022) for Follow up aortic stenosis. Or sooner if needed. --Continue follow-up with your primary care physician regarding the management of your other chronic comorbid conditions.  Patient's questions and concerns were addressed to his satisfaction. He voices understanding of the instructions provided during this encounter.   This note was created using a voice recognition software as a result there may be grammatical errors inadvertently enclosed that do not reflect the nature of this encounter. Every attempt is made to correct such errors.  Rex Kras, [place], [facility]  Pager: [phone] Office: [phone]

## 2021-11-12 ENCOUNTER — Ambulatory Visit: Payer: Medicare Other

## 2021-11-12 DIAGNOSIS — I350 Nonrheumatic aortic (valve) stenosis: Secondary | ICD-10-CM | POA: Diagnosis not present

## 2021-11-12 DIAGNOSIS — Z01818 Encounter for other preprocedural examination: Secondary | ICD-10-CM

## 2021-11-14 ENCOUNTER — Other Ambulatory Visit: Payer: Self-pay | Admitting: Cardiology

## 2021-11-14 DIAGNOSIS — I459 Conduction disorder, unspecified: Secondary | ICD-10-CM

## 2021-11-14 NOTE — Progress Notes (Signed)
Patient known to have prolonged first degree AV block at baseline.   GXT notes SR at rest but w/ stress difficult to determine the underlying rhythm. Also patient did not achieve 85% APMHR.   Will refer the patient to EP to see if he is a candidate for either EP study or device therapy vs. Medical management.   Rex Kras, [place], [facility]  Pager: [phone] Office: [phone]

## 2021-12-14 ENCOUNTER — Encounter: Payer: Self-pay | Admitting: Internal Medicine

## 2021-12-14 ENCOUNTER — Ambulatory Visit: Payer: Medicare Other | Admitting: Internal Medicine

## 2021-12-14 DIAGNOSIS — I440 Atrioventricular block, first degree: Secondary | ICD-10-CM | POA: Insufficient documentation

## 2021-12-14 NOTE — Progress Notes (Signed)
HPI Peter Gallegos is referred by Dr. Terri Skains to evaluate chronotropic incompetence. He is a pleasant 88 yo man who has been fairly healthy. He is active working on projects in his house and garden and also likes to walk with his wife and on the treadmill. He denies chest pain. He wore a Zio monitor demonstrating first degree AV block and AVWB. He has not had syncope. He has not had prolonged pauses.   No Known Allergies   Current Outpatient Medications  Medication Sig Dispense Refill   alendronate (FOSAMAX) 70 MG tablet Take 70 mg by mouth once a week.     atorvastatin (LIPITOR) 40 MG tablet Take 40 mg by mouth daily.     buPROPion (WELLBUTRIN XL) 300 MG 24 hr tablet Take 300 mg by mouth daily.     CALCIUM PO Take 1 tablet by mouth daily.     Cholecalciferol (VITAMIN D) 125 MCG (5000 UT) CAPS Take 5,000 Units by mouth daily.     dipyridamole-aspirin (AGGRENOX) 200-25 MG 12hr capsule Take 1 capsule by mouth 2 (two) times daily.     Multiple Vitamin (MULTIVITAMIN WITH MINERALS) TABS tablet Take 1 tablet by mouth daily.     potassium chloride (KLOR-CON) 10 MEQ tablet Take 10 mEq by mouth daily.     sertraline (ZOLOFT) 50 MG tablet Take 50 mg by mouth daily.     triamterene-hydrochlorothiazide (MAXZIDE-25) 37.5-25 MG per tablet Take 1 tablet by mouth daily.     No current facility-administered medications for this visit.     Past Medical History:  Diagnosis Date   Anxiety and depression    Depression    Diverticulosis    Family history of coronary artery disease    GERD (gastroesophageal reflux disease)    History of arm fracture    Rt arm, LT elbow fracture   History of colon polyps    History of ETOH abuse    Quit in 1980   Hypercholesterolemia    Hypertension    PONV (postoperative nausea and vomiting)    TIA (transient ischemic attack) 12/2005    ROS:   All systems reviewed and negative except as noted in the HPI.   Past Surgical History:  Procedure Laterality Date    COLONOSCOPY     OPEN REDUCTION INTERNAL FIXATION (ORIF) DISTAL PHALANX Left 11/21/2013   Procedure: OPEN REDUCTION INTERNAL FIXATION (ORIF) DISTAL PHALANX;  Surgeon: Linna Hoff, MD;  Location: [place];  Service: Orthopedics;  Laterality: Left;   ORIF WRIST FRACTURE Right 11/25/2020   Procedure: OPEN REDUCTION INTERNAL FIXATION WRIST FRACTURE;  Surgeon: Roseanne Kaufman, MD;  Location: [place];  Service: Orthopedics;  Laterality: Right;     Family History  Problem Relation Age of Onset   Heart disease Mother    Heart disease Father        Deceased 28 ( Had MI )     Social History   Socioeconomic History   Marital status: Married    Spouse name: Not on file   Number of children: 7   Years of education: Not on file   Highest education level: Not on file  Occupational History   Not on file  Tobacco Use   Smoking status: Former    Types: Pipe    Quit date: 1986    Years since quitting: 37.5   Smokeless tobacco: Never   Tobacco comments:    Quit smoking pipe years ago  Vaping Use   Vaping Use:  Never used  Substance and Sexual Activity   Alcohol use: No   Drug use: No   Sexual activity: Not on file  Other Topics Concern   Not on file  Social History Narrative   Not on file   Social Determinants of Health   Financial Resource Strain: Not on file  Food Insecurity: Not on file  Transportation Needs: Not on file  Physical Activity: Not on file  Stress: Not on file  Social Connections: Not on file  Intimate Partner Violence: Not on file     BP 126/74   Pulse 79   Ht '5\' 6"'$  (1.676 m)   Wt 166 lb 9.6 oz (75.6 kg)   SpO2 98%   BMI 26.89 kg/m   Physical Exam:  Well appearing NAD HEENT: Unremarkable Neck:  No JVD, no thyromegally Lymphatics:  No adenopathy Back:  No CVA tenderness Lungs:  Clear with no wheezes HEART:  Regular rate rhythm,  2/6 systolic murmurs, no rubs, no clicks Abd:  soft, positive bowel sounds, no organomegally, no rebound, no guarding Ext:   2 plus pulses, no edema, no cyanosis, no clubbing Skin:  No rashes no nodules Neuro:  CN II through XII intact, motor grossly intact  EKG - reviewed.  NSR with marked first degree AV block and AVWB.   Assess/Plan:  AVWB - he appears at this time to be symptomatic. I discussed the symptoms he might experience if his conduction system worsen and he will call us if needed.  HTN - avoid AV blocking meds.   Carleene Overlie Charmain Diosdado,MD

## 2021-12-14 NOTE — Patient Instructions (Addendum)
Medication Instructions:  Your physician recommends that you continue on your current medications as directed. Please refer to the Current Medication list given to you today.  Labwork: None ordered.  Testing/Procedures: None ordered.  Follow-Up:  AS NEEDED  Any Other Special Instructions Will Be Listed Below (If Applicable).  If you need a refill on your cardiac medications before your next appointment, please call your pharmacy.   Important Information About Sugar

## 2021-12-29 ENCOUNTER — Ambulatory Visit: Payer: Medicare Other | Admitting: Podiatry

## 2021-12-29 DIAGNOSIS — M79674 Pain in right toe(s): Secondary | ICD-10-CM | POA: Diagnosis not present

## 2021-12-29 DIAGNOSIS — M79675 Pain in left toe(s): Secondary | ICD-10-CM

## 2021-12-29 DIAGNOSIS — B351 Tinea unguium: Secondary | ICD-10-CM | POA: Diagnosis not present

## 2021-12-29 NOTE — Progress Notes (Signed)
  Subjective:  Patient ID: Peter Gallegos, male    DOB: 02-May-1933,  MRN: [id number]  Chief Complaint  Patient presents with   Nail Problem   88 y.o. male returns for the above complaint.  Patient presents with thickened elongated dystrophic onychomycosis bilaterally x10.  They are painful to palpation.  Patient states that they are very hard to and painful to ambulate on.  Patient would like to have the repair done.  He has no other acute concerns.  Objective:  There were no vitals filed for this visit. Podiatric Exam: Vascular: dorsalis pedis and posterior tibial pulses are palpable bilateral. Capillary return is immediate. Temperature gradient is WNL. Skin turgor WNL  Sensorium: Normal Semmes Weinstein monofilament test. Normal tactile sensation bilaterally. Nail Exam: Pt has thick disfigured discolored nails with subungual debris noted bilateral entire nail hallux through fifth toenails.  Pain on palpation to the nails. Ulcer Exam: There is no evidence of ulcer or pre-ulcerative changes or infection. Orthopedic Exam: Muscle tone and strength are WNL. No limitations in general ROM. No crepitus or effusions noted. HAV  B/L.  Hammer toes 2-5  B/L. Skin: Hyperkeratotic lesion noted to bilateral medial aspect of the hallux.  No pinpoint bleeding noted.  Pain on palpation to the lesion. No infection or ulcers.  Dry skin without fissure or pain to the bilateral lower extremity    Assessment & Plan:   No diagnosis found.     Patient was evaluated and treated and all questions answered.  Xerosis bilateral lower extremity -I explained to the patient the etiology of xerosis and various treatment options were extensively discussed.  I explained to the patient the importance of maintaining moisturization of the skin with application of over-the-counter lotion such as Eucerin or Luciderm.  I have asked the patient to apply this twice a day.  If unable to resolve patient will benefit from  prescription lotion.  Porokeratosis bilateral hallux x2 -I explained to patient the etiology of porokeratosis versus treatment options were discussed.  Given the amount of pain that is having I believe patient will benefit from aggressive debridement of the lesion.  Using chisel blade and handle the lesions were debrided down to healthy striated tissue.  No complication noted.  No pinpoint bleeding noted  Onychomycosis with pain  -Nails palliatively debrided as below. -Educated on self-care  Procedure: Nail Debridement Rationale: pain  Type of Debridement: manual, sharp debridement. Instrumentation: Nail nipper, rotary burr. Number of Nails: 10  Procedures and Treatment: Consent by patient was obtained for treatment procedures. The patient understood the discussion of treatment and procedures well. All questions were answered thoroughly reviewed. Debridement of mycotic and hypertrophic toenails, 1 through 5 bilateral and clearing of subungual debris. No ulceration, no infection noted.  Return Visit-Office Procedure: Patient instructed to return to the office for a follow up visit 3 months for continued evaluation and treatment.  [name], DPM    No follow-ups on file.

## 2022-02-23 ENCOUNTER — Encounter: Payer: Self-pay | Admitting: Cardiology

## 2022-04-01 ENCOUNTER — Ambulatory Visit: Payer: Medicare Other | Admitting: Podiatry

## 2022-04-25 ENCOUNTER — Ambulatory Visit: Payer: Medicare Other | Admitting: Cardiology

## 2022-05-06 ENCOUNTER — Ambulatory Visit: Payer: Medicare Other | Admitting: Podiatry

## 2022-06-02 ENCOUNTER — Ambulatory Visit: Payer: Medicare Other | Admitting: Cardiology

## 2022-06-02 ENCOUNTER — Encounter: Payer: Self-pay | Admitting: Cardiology

## 2022-06-02 VITALS — BP 140/87 | HR 71 | Resp 17 | Ht 66.0 in | Wt 148.2 lb

## 2022-06-02 DIAGNOSIS — I459 Conduction disorder, unspecified: Secondary | ICD-10-CM

## 2022-06-02 DIAGNOSIS — E782 Mixed hyperlipidemia: Secondary | ICD-10-CM | POA: Diagnosis not present

## 2022-06-02 DIAGNOSIS — Z8679 Personal history of other diseases of the circulatory system: Secondary | ICD-10-CM

## 2022-06-02 DIAGNOSIS — Z8673 Personal history of transient ischemic attack (TIA), and cerebral infarction without residual deficits: Secondary | ICD-10-CM | POA: Diagnosis not present

## 2022-06-02 DIAGNOSIS — Z01818 Encounter for other preprocedural examination: Secondary | ICD-10-CM

## 2022-06-02 DIAGNOSIS — I350 Nonrheumatic aortic (valve) stenosis: Secondary | ICD-10-CM | POA: Diagnosis not present

## 2022-06-02 DIAGNOSIS — I10 Essential (primary) hypertension: Secondary | ICD-10-CM

## 2022-06-02 NOTE — Progress Notes (Signed)
ID:  Peter Gallegos, DOB Dec 02, 1932, MRN [id number]  PCP:  Deland Pretty, MD  Cardiologist:  Rex Kras, DO, [facility] (established care 09/20/2021)  Date: 10/21/21 Last Office Visit: 09/20/2021  Chief Complaint  Patient presents with   Follow-up    6 month aortic stenosis    HPI  Peter Gallegos is a 88 y.o. Caucasian male whose past medical history and cardiovascular risk factors include: Aortic stenosis, hypertension, history of TIA, family history of heart disease, history of alcohol abuse, hyperlipidemia, subarachnoid hematoma, PVCs, first-degree AV block, history of PACs, advanced age  Prior to his cataract surgery he did have an EKG which was concerning for possible conduction disease was referred to cardiology for further evaluation and management.  Since establishing care he has undergone GXT which illustrated good chronotropic competence and Zio patch did not illustrate any significant dysrhythmias/pauses.  I also referred him to electrophysiology for further evaluation and management.   Since last office visit patient is asymptomatic.  Denies near-syncope or syncopal events.  Overall functional capacity remains stable.  His cataract surgery is still pending.  Denies anginal discomfort or heart failure symptoms.  FUNCTIONAL STATUS: No structured exercise program or daily routine.   ALLERGIES: No Known Allergies  MEDICATION LIST PRIOR TO VISIT: Current Meds  Medication Sig   alendronate (FOSAMAX) 70 MG tablet Take 70 mg by mouth once a week.   atorvastatin (LIPITOR) 40 MG tablet Take 40 mg by mouth daily.   buPROPion (WELLBUTRIN XL) 300 MG 24 hr tablet Take 300 mg by mouth daily.   CALCIUM PO Take 1 tablet by mouth daily.   Cholecalciferol (VITAMIN D) 125 MCG (5000 UT) CAPS Take 5,000 Units by mouth daily.   dipyridamole-aspirin (AGGRENOX) 200-25 MG 12hr capsule Take 1 capsule by mouth 2 (two) times daily.   Multiple Vitamin (MULTIVITAMIN WITH MINERALS) TABS tablet  Take 1 tablet by mouth daily.   potassium chloride (KLOR-CON) 10 MEQ tablet Take 10 mEq by mouth daily.   sertraline (ZOLOFT) 50 MG tablet Take 50 mg by mouth daily.   triamterene-hydrochlorothiazide (MAXZIDE-25) 37.5-25 MG per tablet Take 1 tablet by mouth daily.     PAST MEDICAL HISTORY: Past Medical History:  Diagnosis Date   Anxiety and depression    Depression    Diverticulosis    Family history of coronary artery disease    GERD (gastroesophageal reflux disease)    History of arm fracture    Rt arm, LT elbow fracture   History of colon polyps    History of ETOH abuse    Quit in 1980   Hypercholesterolemia    Hypertension    PONV (postoperative nausea and vomiting)    TIA (transient ischemic attack) 12/2005    PAST SURGICAL HISTORY: Past Surgical History:  Procedure Laterality Date   COLONOSCOPY     OPEN REDUCTION INTERNAL FIXATION (ORIF) DISTAL PHALANX Left 11/21/2013   Procedure: OPEN REDUCTION INTERNAL FIXATION (ORIF) DISTAL PHALANX;  Surgeon: Linna Hoff, MD;  Location: [place];  Service: Orthopedics;  Laterality: Left;   ORIF WRIST FRACTURE Right 11/25/2020   Procedure: OPEN REDUCTION INTERNAL FIXATION WRIST FRACTURE;  Surgeon: Roseanne Kaufman, MD;  Location: [place];  Service: Orthopedics;  Laterality: Right;    FAMILY HISTORY: The patient family history includes Heart disease in his father and mother.  SOCIAL HISTORY:  The patient  reports that he quit smoking about 38 years ago. His smoking use included pipe. He has never used smokeless tobacco. He reports that  he does not drink alcohol and does not use drugs.  REVIEW OF SYSTEMS: Review of Systems  Constitutional: Negative for malaise/fatigue.  Cardiovascular:  Negative for chest pain, dyspnea on exertion, leg swelling, near-syncope, orthopnea, palpitations, paroxysmal nocturnal dyspnea and syncope.  Respiratory:  Negative for shortness of breath.   Neurological:  Negative for dizziness and light-headedness.     PHYSICAL EXAM:    06/02/2022   10:36 AM 12/14/2021    8:21 AM 10/21/2021    1:25 PM  Vitals with BMI  Height '5\' 6"'$  '5\' 6"'$  '5\' 6"'$   Weight 148 lbs 3 oz 166 lbs 10 oz 166 lbs  BMI 23.93 57.0 17.79  Systolic 390 300 923  Diastolic 87 74 69  Pulse 71 79 80   Physical Exam  Constitutional: No distress.  Age appropriate, hemodynamically stable.   Neck: No JVD present.  Cardiovascular: Normal rate, regular rhythm, S1 normal, S2 normal, intact distal pulses and normal pulses. Exam reveals no gallop, no S3 and no S4.  Murmur heard. Midsystolic murmur is present with a grade of 3/6 at the upper right sternal border. Pulmonary/Chest: Effort normal and breath sounds normal. No stridor. He has no wheezes. He has no rales.  Abdominal: Soft. Bowel sounds are normal. He exhibits no distension. There is no abdominal tenderness.  Musculoskeletal:        General: No edema.     Cervical back: Neck supple.  Neurological: He is alert and oriented to person, place, and time. He has intact cranial nerves (2-12).  Skin: Skin is warm and moist.   CARDIAC DATABASE: EKG: 09/20/2021: Normal sinus rhythm, prolonged first-degree AV block, without underlying injury pattern. 06/02/2022: Sinus rhythm, 67 bpm, second-degree type I Wenckebach physiology.  Echocardiogram: 10/12/2021:  Left ventricle cavity is normal in size. Normal left ventricular wall thickness. Normal global wall motion. Normal LV systolic function with EF 61%. Doppler evidence of grade I (impaired) diastolic dysfunction, normal LAP.  Left atrial cavity is moderately dilated.  Trileaflet aortic valve with mild aortic valve leaflet calcification.    Moderate aortic valve stenosis. No regurgitation. Vmax 2.2 m/sec, mean PG 11 mmHg, AVA 1.1 cm by continuity equation.  Mild (Grade I) mitral regurgitation.  IVC not seen.   Stress Testing: No results found for this or any previous visit from the past 1095 days.   Heart  Catheterization: None  Cardiac monitor (Zio Patch): Patch Wear Time:  6 days and 23 hours  Dominant rhythm sinus with first-degree AV block. Heart rate 30-102 bpm.  Avg HR 68 bpm. No atrial fibrillation, supraventricular tachycardia, ventricular tachycardia, high grade AV block, pauses (3 seconds or longer). Minimum HR 30 bpm, asymptomatic, on 09/26/2021, at 11:20 PM, sinus with second-degree type I AV block. Total ventricular ectopic burden <1%. Total supraventricular ectopic burden 0%. Patient triggered events: 0.  LABORATORY DATA: Name Result Date Reference Range  Lipid Panel   2021-08-31    Cholesterol 153   <200  Cholesterol / HDL Ratio 2.10   0.00-4.99  HDL Cholesterol 73   >39  LDL Cholesterol (Calculation) 47   <130  LDL/HDL Ratio 0.6   <3.3  Non-HDL Cholesterol 80   <130  Triglycerides 167   <150  CBC With Platelet And Differential RS In-house   2021-08-31    Absolute Basophils 0.0   0.0-0.1  Absolute Eosinophils 0.1   0.0-0.6  Absolute Lymphocytes 2.5   0.9-3.6  Absolute Monocytes 0.6   0.3-1.0  Absolute Neutrophils 3.9   2.0-8.2  Basophils  Automated 0.3   0.0-2.0  Eosinophils Automated 2.0   0.0-7.0  Hematocrit 47.2   37.0-47.0  Hemoglobin 15.7   13.5-18.0  Lymphocytes Automated 34.9   20.5-51.1  MCH 30.1   26.0-33.0  MCHC 33.3   32.0-36.0  MCV 90.4   80.0-100.0  Monocytes Automated 8.5   5.0-12.0  Neutrophils Automated 54.3   42.2-75.2  Platelet Count 195   140-400  RBC 5.22   4.20-5.40  RDW 46.1      WBC 7.1   4.5-11.0  Comprehensive Metabolic Panel RS In-house   2021-08-31    Albumin 3.8   3.4-5.0  Albumin/Globulin Ratio 1.3   1.1-2.5  Alkaline Phosphatase 54   25-150  ALT (SGPT) 34   <6-78  AST (SGOT) 22   0-40  Bilirubin, Total 0.7   0.2-1.0  BUN 23   7-18  BUN/Creatinine Ratio 14.9   11.0-26.0  Calcium 9.4   8.5-10.1  Chloride 102   98-107  CO2 31   21-32  Creatinine 1.54   0.70-1.30  GFR/Black 46   >59  GFR/White 40   >59  Globulin,  Calculated 2.9   1.5-4.6  Glucose 95   74-106  Potassium 3.7   3.5-5.1  Protein 6.7   6.4-8.2  Sodium 142   136-145     IMPRESSION:    ICD-10-CM   1. Pre-op evaluation  Z01.818     2. Nonrheumatic aortic valve stenosis  I35.0 EKG 12-Lead    3. Conduction disorder of the heart  I45.9 EKG 12-Lead    4. Benign hypertension  I10     5. Hx of transient ischemic attack (TIA)  Z86.73     6. Mixed hyperlipidemia  E78.2     7. Hx of subarachnoid hemorrhage  Z86.79        RECOMMENDATIONS: Obrien Huskins is a 88 y.o. Caucasian male whose past medical history and cardiac risk factors include: Hypertension, history of TIA, family history of heart disease, history of alcohol abuse, hyperlipidemia, subarachnoid hematoma, PVCs, first-degree AV block, history of PACs, advanced age.  Pre-op evaluation Cataract surgeries are still pending. Denies anginal discomfort, heart failure symptoms, or syncope. Patient has underlying conduction disease (as noted above) and has been evaluated by EP and no additional device therapy warranted at this time. He is overall considered to be acceptable risk for upcoming noncardiac surgery.  Nonrheumatic aortic valve stenosis Asymptomatic. Discovered in May 2023. Scheduled for repeat echo in May 2024. Patient is asked to seek medical attention if he has anginal discomfort, heart failure symptoms, or syncope.  Conduction disorder of the heart Prior EKGs have illustrated prolonged first-degree AV block as well as Darden Amber physiology. Recent GXT patient achieves 84% of age-predicted maximum heart rate. No syncope  Benign hypertension Office blood pressures are within acceptable limits but not at goal. Medications reconciled. Currently managed by primary care provider.  Mixed hyperlipidemia Currently on atorvastatin.   He denies myalgia or other side effects. Currently managed by primary care provider.  FINAL MEDICATION LIST END OF  ENCOUNTER: No orders of the defined types were placed in this encounter.   There are no discontinued medications.   Current Outpatient Medications:    alendronate (FOSAMAX) 70 MG tablet, Take 70 mg by mouth once a week., Disp: , Rfl:    atorvastatin (LIPITOR) 40 MG tablet, Take 40 mg by mouth daily., Disp: , Rfl:    buPROPion (WELLBUTRIN XL) 300 MG 24 hr tablet, Take 300 mg by mouth daily.,  Disp: , Rfl:    CALCIUM PO, Take 1 tablet by mouth daily., Disp: , Rfl:    Cholecalciferol (VITAMIN D) 125 MCG (5000 UT) CAPS, Take 5,000 Units by mouth daily., Disp: , Rfl:    dipyridamole-aspirin (AGGRENOX) 200-25 MG 12hr capsule, Take 1 capsule by mouth 2 (two) times daily., Disp: , Rfl:    Multiple Vitamin (MULTIVITAMIN WITH MINERALS) TABS tablet, Take 1 tablet by mouth daily., Disp: , Rfl:    potassium chloride (KLOR-CON) 10 MEQ tablet, Take 10 mEq by mouth daily., Disp: , Rfl:    sertraline (ZOLOFT) 50 MG tablet, Take 50 mg by mouth daily., Disp: , Rfl:    triamterene-hydrochlorothiazide (MAXZIDE-25) 37.5-25 MG per tablet, Take 1 tablet by mouth daily., Disp: , Rfl:   Orders Placed This Encounter  Procedures   EKG 12-Lead    There are no Patient Instructions on file for this visit.   --Continue cardiac medications as reconciled in final medication list. --Return in about 6 months (around 12/02/2022) for Follow up AVWB and aortic stenosis. . Or sooner if needed. --Continue follow-up with your primary care physician regarding the management of your other chronic comorbid conditions.  Patient's questions and concerns were addressed to his satisfaction. He voices understanding of the instructions provided during this encounter.   This note was created using a voice recognition software as a result there may be grammatical errors inadvertently enclosed that do not reflect the nature of this encounter. Every attempt is made to correct such errors.  Rex Kras, [place], [facility]  Pager:  [phone] Office: [phone]

## 2022-06-03 ENCOUNTER — Ambulatory Visit: Payer: Medicare Other | Admitting: Podiatry

## 2022-06-08 ENCOUNTER — Ambulatory Visit: Payer: Medicare Other | Admitting: Podiatry

## 2022-06-08 VITALS — BP 138/72

## 2022-06-08 DIAGNOSIS — M79675 Pain in left toe(s): Secondary | ICD-10-CM | POA: Diagnosis not present

## 2022-06-08 DIAGNOSIS — B351 Tinea unguium: Secondary | ICD-10-CM

## 2022-06-08 DIAGNOSIS — M79674 Pain in right toe(s): Secondary | ICD-10-CM

## 2022-06-15 NOTE — Progress Notes (Signed)
  Subjective:  Patient ID: Peter Gallegos, male    DOB: [birth year],  MRN: [id number]  Chief Complaint  Patient presents with   Nail Problem    Nail trim    89 y.o. male returns for the above complaint.  Patient presents with thickened elongated dystrophic onychomycosis bilaterally x10.  They are painful to palpation.  Patient states that they are very hard to and painful to ambulate on.  Patient would like to have the repair done.  He has no other acute concerns.  Objective:   Vitals:   06/08/22 1421  BP: 138/72   Podiatric Exam: Vascular: dorsalis pedis and posterior tibial pulses are palpable bilateral. Capillary return is immediate. Temperature gradient is WNL. Skin turgor WNL  Sensorium: Normal Semmes Weinstein monofilament test. Normal tactile sensation bilaterally. Nail Exam: Pt has thick disfigured discolored nails with subungual debris noted bilateral entire nail hallux through fifth toenails.  Pain on palpation to the nails. Ulcer Exam: There is no evidence of ulcer or pre-ulcerative changes or infection. Orthopedic Exam: Muscle tone and strength are WNL. No limitations in general ROM. No crepitus or effusions noted. HAV  B/L.  Hammer toes 2-5  B/L. Skin: Hyperkeratotic lesion noted to bilateral medial aspect of the hallux.  No pinpoint bleeding noted.  Pain on palpation to the lesion. No infection or ulcers.  Dry skin without fissure or pain to the bilateral lower extremity    Assessment & Plan:   No diagnosis found.     Patient was evaluated and treated and all questions answered.  Xerosis bilateral lower extremity -I explained to the patient the etiology of xerosis and various treatment options were extensively discussed.  I explained to the patient the importance of maintaining moisturization of the skin with application of over-the-counter lotion such as Eucerin or Luciderm.  I have asked the patient to apply this twice a day.  If unable to resolve patient will  benefit from prescription lotion.  Porokeratosis bilateral hallux x2 -I explained to patient the etiology of porokeratosis versus treatment options were discussed.  Given the amount of pain that is having I believe patient will benefit from aggressive debridement of the lesion.  Using chisel blade and handle the lesions were debrided down to healthy striated tissue.  No complication noted.  No pinpoint bleeding noted  Onychomycosis with pain  -Nails palliatively debrided as below. -Educated on self-care  Procedure: Nail Debridement Rationale: pain  Type of Debridement: manual, sharp debridement. Instrumentation: Nail nipper, rotary burr. Number of Nails: 10  Procedures and Treatment: Consent by patient was obtained for treatment procedures. The patient understood the discussion of treatment and procedures well. All questions were answered thoroughly reviewed. Debridement of mycotic and hypertrophic toenails, 1 through 5 bilateral and clearing of subungual debris. No ulceration, no infection noted.  Return Visit-Office Procedure: Patient instructed to return to the office for a follow up visit 3 months for continued evaluation and treatment.  [name], DPM    No follow-ups on file.

## 2022-09-06 DIAGNOSIS — M810 Age-related osteoporosis without current pathological fracture: Secondary | ICD-10-CM | POA: Diagnosis not present

## 2022-09-06 DIAGNOSIS — I10 Essential (primary) hypertension: Secondary | ICD-10-CM | POA: Diagnosis not present

## 2022-09-09 DIAGNOSIS — M810 Age-related osteoporosis without current pathological fracture: Secondary | ICD-10-CM | POA: Diagnosis not present

## 2022-09-09 DIAGNOSIS — I440 Atrioventricular block, first degree: Secondary | ICD-10-CM | POA: Diagnosis not present

## 2022-09-09 DIAGNOSIS — I10 Essential (primary) hypertension: Secondary | ICD-10-CM | POA: Diagnosis not present

## 2022-09-09 DIAGNOSIS — Z0000 Encounter for general adult medical examination without abnormal findings: Secondary | ICD-10-CM | POA: Diagnosis not present

## 2022-09-09 DIAGNOSIS — N1832 Chronic kidney disease, stage 3b: Secondary | ICD-10-CM | POA: Diagnosis not present

## 2022-09-09 DIAGNOSIS — K219 Gastro-esophageal reflux disease without esophagitis: Secondary | ICD-10-CM | POA: Diagnosis not present

## 2022-09-09 DIAGNOSIS — H269 Unspecified cataract: Secondary | ICD-10-CM | POA: Diagnosis not present

## 2022-09-09 DIAGNOSIS — L602 Onychogryphosis: Secondary | ICD-10-CM | POA: Diagnosis not present

## 2022-09-09 DIAGNOSIS — L570 Actinic keratosis: Secondary | ICD-10-CM | POA: Diagnosis not present

## 2022-09-09 DIAGNOSIS — E785 Hyperlipidemia, unspecified: Secondary | ICD-10-CM | POA: Diagnosis not present

## 2022-09-09 DIAGNOSIS — H35039 Hypertensive retinopathy, unspecified eye: Secondary | ICD-10-CM | POA: Diagnosis not present

## 2022-09-21 ENCOUNTER — Ambulatory Visit: Payer: Medicare Other | Admitting: Podiatry

## 2022-09-21 DIAGNOSIS — B351 Tinea unguium: Secondary | ICD-10-CM | POA: Diagnosis not present

## 2022-09-21 DIAGNOSIS — M79674 Pain in right toe(s): Secondary | ICD-10-CM

## 2022-09-21 DIAGNOSIS — M79675 Pain in left toe(s): Secondary | ICD-10-CM

## 2022-09-21 NOTE — Progress Notes (Signed)
  Subjective:  Patient ID: Peter Gallegos, male    DOB: [birth year],  MRN: [id number]  Chief Complaint  Patient presents with   Nail Problem    Nail trim    89 y.o. male returns for the above complaint.  Patient presents with thickened elongated dystrophic onychomycosis bilaterally x10.  They are painful to palpation.  Patient states that they are very hard to and painful to ambulate on.  Patient would like to have the repair done.  He has no other acute concerns.  Objective:   There were no vitals filed for this visit.  Podiatric Exam: Vascular: dorsalis pedis and posterior tibial pulses are palpable bilateral. Capillary return is immediate. Temperature gradient is WNL. Skin turgor WNL  Sensorium: Normal Semmes Weinstein monofilament test. Normal tactile sensation bilaterally. Nail Exam: Pt has thick disfigured discolored nails with subungual debris noted bilateral entire nail hallux through fifth toenails.  Pain on palpation to the nails. Ulcer Exam: There is no evidence of ulcer or pre-ulcerative changes or infection. Orthopedic Exam: Muscle tone and strength are WNL. No limitations in general ROM. No crepitus or effusions noted. HAV  B/L.  Hammer toes 2-5  B/L. Skin: Hyperkeratotic lesion noted to bilateral medial aspect of the hallux.  No pinpoint bleeding noted.  Pain on palpation to the lesion. No infection or ulcers.  Dry skin without fissure or pain to the bilateral lower extremity    Assessment & Plan:   No diagnosis found.     Patient was evaluated and treated and all questions answered.  Xerosis bilateral lower extremity -I explained to the patient the etiology of xerosis and various treatment options were extensively discussed.  I explained to the patient the importance of maintaining moisturization of the skin with application of over-the-counter lotion such as Eucerin or Luciderm.  I have asked the patient to apply this twice a day.  If unable to resolve patient  will benefit from prescription lotion.  Porokeratosis bilateral hallux x2 -I explained to patient the etiology of porokeratosis versus treatment options were discussed.  Given the amount of pain that is having I believe patient will benefit from aggressive debridement of the lesion.  Using chisel blade and handle the lesions were debrided down to healthy striated tissue.  No complication noted.  No pinpoint bleeding noted  Onychomycosis with pain  -Nails palliatively debrided as below. -Educated on self-care  Procedure: Nail Debridement Rationale: pain  Type of Debridement: manual, sharp debridement. Instrumentation: Nail nipper, rotary burr. Number of Nails: 10  Procedures and Treatment: Consent by patient was obtained for treatment procedures. The patient understood the discussion of treatment and procedures well. All questions were answered thoroughly reviewed. Debridement of mycotic and hypertrophic toenails, 1 through 5 bilateral and clearing of subungual debris. No ulceration, no infection noted.  Return Visit-Office Procedure: Patient instructed to return to the office for a follow up visit 3 months for continued evaluation and treatment.  Nicholes Rough, DPM    No follow-ups on file.

## 2022-12-02 ENCOUNTER — Encounter: Payer: Self-pay | Admitting: Cardiology

## 2022-12-02 ENCOUNTER — Ambulatory Visit: Payer: Medicare Other | Admitting: Cardiology

## 2022-12-02 VITALS — BP 99/63 | HR 74 | Resp 16 | Ht 66.0 in | Wt 139.0 lb

## 2022-12-02 DIAGNOSIS — I10 Essential (primary) hypertension: Secondary | ICD-10-CM | POA: Diagnosis not present

## 2022-12-02 DIAGNOSIS — Z8673 Personal history of transient ischemic attack (TIA), and cerebral infarction without residual deficits: Secondary | ICD-10-CM | POA: Diagnosis not present

## 2022-12-02 DIAGNOSIS — E782 Mixed hyperlipidemia: Secondary | ICD-10-CM | POA: Diagnosis not present

## 2022-12-02 DIAGNOSIS — I350 Nonrheumatic aortic (valve) stenosis: Secondary | ICD-10-CM

## 2022-12-02 DIAGNOSIS — I459 Conduction disorder, unspecified: Secondary | ICD-10-CM | POA: Diagnosis not present

## 2022-12-02 MED ORDER — LOSARTAN POTASSIUM 25 MG PO TABS: 25.0000 mg | ORAL_TABLET | Freq: Every morning | ORAL | 0 refills | Status: DC

## 2022-12-02 NOTE — Progress Notes (Signed)
ID:  Ellington Kaneko, DOB 1933-01-23, MRN [id number]  PCP:  Merri Brunette, MD  Cardiologist:  Tessa Lerner, DO, [facility] (established care 09/20/2021)  Date: 12/02/22 Last Office Visit: 06/02/2022  Chief Complaint  Patient presents with   Follow-up   Dizziness   Shortness of Breath    HPI  Quintarus Cravens is a 89 y.o. Caucasian male whose past medical history and cardiovascular risk factors include: Aortic stenosis, hypertension, history of TIA, family history of heart disease, history of alcohol abuse, hyperlipidemia, subarachnoid hematoma, PVCs, first-degree AV block, history of PACs, advanced age  Prior to his cataract surgery he did have an EKG which was concerning for possible conduction disease was referred to cardiology for further evaluation and management.  Since establishing care he has undergone GXT which illustrated good chronotropic competence and Zio patch did not illustrate any significant dysrhythmias/pauses.  He has seen Dr. Ladona Ridgel for this in the past.    Patient and his wife are here for 69-month office visit.  He denies anginal chest pain or heart failure symptoms.  However recently has been experiencing lightheaded, dizziness, and shortness of breath when going up a flight of stairs.  He denies any near-syncope or syncopal events.  He is currently on triamterene/hydrochlorothiazide for blood pressure management in his office and home blood pressures are soft.  FUNCTIONAL STATUS: No structured exercise program or daily routine.   ALLERGIES: No Known Allergies  MEDICATION LIST PRIOR TO VISIT: Current Meds  Medication Sig   alendronate (FOSAMAX) 70 MG tablet Take 70 mg by mouth once a week.   atorvastatin (LIPITOR) 40 MG tablet Take 40 mg by mouth daily.   buPROPion (WELLBUTRIN XL) 300 MG 24 hr tablet Take 300 mg by mouth daily.   CALCIUM PO Take 1 tablet by mouth daily.   Cholecalciferol (VITAMIN D) 125 MCG (5000 UT) CAPS Take 5,000 Units by mouth daily.    dipyridamole-aspirin (AGGRENOX) 200-25 MG 12hr capsule Take 1 capsule by mouth 2 (two) times daily.   losartan (COZAAR) 25 MG tablet Take 1 tablet (25 mg total) by mouth every morning.   Multiple Vitamin (MULTIVITAMIN WITH MINERALS) TABS tablet Take 1 tablet by mouth daily.   sertraline (ZOLOFT) 50 MG tablet Take 50 mg by mouth daily.   [DISCONTINUED] potassium chloride (KLOR-CON) 10 MEQ tablet Take 10 mEq by mouth daily.   [DISCONTINUED] triamterene-hydrochlorothiazide (MAXZIDE-25) 37.5-25 MG per tablet Take 1 tablet by mouth daily.     PAST MEDICAL HISTORY: Past Medical History:  Diagnosis Date   Anxiety and depression    Depression    Diverticulosis    Family history of coronary artery disease    GERD (gastroesophageal reflux disease)    History of arm fracture    Rt arm, LT elbow fracture   History of colon polyps    History of ETOH abuse    Quit in 1980   Hypercholesterolemia    Hypertension    PONV (postoperative nausea and vomiting)    TIA (transient ischemic attack) 12/2005    PAST SURGICAL HISTORY: Past Surgical History:  Procedure Laterality Date   COLONOSCOPY     OPEN REDUCTION INTERNAL FIXATION (ORIF) DISTAL PHALANX Left 11/21/2013   Procedure: OPEN REDUCTION INTERNAL FIXATION (ORIF) DISTAL PHALANX;  Surgeon: Sharma Covert, MD;  Location: [facility] OR;  Service: Orthopedics;  Laterality: Left;   ORIF WRIST FRACTURE Right 11/25/2020   Procedure: OPEN REDUCTION INTERNAL FIXATION WRIST FRACTURE;  Surgeon: Dominica Severin, MD;  Location: [facility] OR;  Service: Orthopedics;  Laterality: Right;    FAMILY HISTORY: The patient family history includes Heart disease in his father and mother.  SOCIAL HISTORY:  The patient  reports that he quit smoking about 38 years ago. His smoking use included pipe. He has never used smokeless tobacco. He reports that he does not drink alcohol and does not use drugs.  REVIEW OF SYSTEMS: Review of Systems  Constitutional: Negative for  malaise/fatigue.  Cardiovascular:  Positive for dyspnea on exertion. Negative for chest pain, leg swelling, near-syncope, orthopnea, palpitations, paroxysmal nocturnal dyspnea and syncope.  Respiratory:  Negative for shortness of breath.   Neurological:  Positive for dizziness and light-headedness.    PHYSICAL EXAM:    12/02/2022   10:12 AM 06/08/2022    2:21 PM 06/02/2022   10:36 AM  Vitals with BMI  Height 5\' 6"   5\' 6"   Weight 139 lbs  148 lbs 3 oz  BMI 22.45  23.93  Systolic 99 138 140  Diastolic 63 72 87  Pulse 74  71   Physical Exam  Constitutional: No distress.  Age appropriate, hemodynamically stable.   Neck: No JVD present.  Cardiovascular: Normal rate, regular rhythm, S1 normal, S2 normal, intact distal pulses and normal pulses. Exam reveals no gallop, no S3 and no S4.  Murmur heard. Midsystolic murmur is present with a grade of 3/6 at the upper right sternal border. Pulmonary/Chest: Effort normal and breath sounds normal. No stridor. He has no wheezes. He has no rales.  Abdominal: Soft. Bowel sounds are normal. He exhibits no distension. There is no abdominal tenderness.  Musculoskeletal:        General: No edema.     Cervical back: Neck supple.  Neurological: He is alert and oriented to person, place, and time. He has intact cranial nerves (2-12).  Skin: Skin is warm and moist.   CARDIAC DATABASE: EKG: 09/20/2021: Normal sinus rhythm, prolonged first-degree AV block, without underlying injury pattern. 06/02/2022: Sinus rhythm, 67 bpm, second-degree type I Wenckebach physiology. December 02, 2022: Sinus rhythm with 66 bpm, prolonged first-degree AV block, diffuse T wave abnormality.  Echocardiogram: 10/12/2021:  Left ventricle cavity is normal in size. Normal left ventricular wall thickness. Normal global wall motion. Normal LV systolic function with EF 61%. Doppler evidence of grade I (impaired) diastolic dysfunction, normal LAP.  Left atrial cavity is moderately dilated.   Trileaflet aortic valve with mild aortic valve leaflet calcification.    Moderate aortic valve stenosis. No regurgitation. Vmax 2.2 m/sec, mean PG 11 mmHg, AVA 1.1 cm by continuity equation.  Mild (Grade I) mitral regurgitation.  IVC not seen.   Stress Testing: No results found for this or any previous visit from the past 1095 days.   Heart Catheterization: None  Cardiac monitor (Zio Patch): Patch Wear Time:  6 days and 23 hours  Dominant rhythm sinus with first-degree AV block. Heart rate 30-102 bpm.  Avg HR 68 bpm. No atrial fibrillation, supraventricular tachycardia, ventricular tachycardia, high grade AV block, pauses (3 seconds or longer). Minimum HR 30 bpm, asymptomatic, on 09/26/2021, at 11:20 PM, sinus with second-degree type I AV block. Total ventricular ectopic burden <1%. Total supraventricular ectopic burden 0%. Patient triggered events: 0.  LABORATORY DATA: Name Result Date Reference Range  Lipid Panel   2021-08-31    Cholesterol 153   <200  Cholesterol / HDL Ratio 2.10   0.00-4.99  HDL Cholesterol 73   >39  LDL Cholesterol (Calculation) 47   <130  LDL/HDL Ratio 0.6   <  3.3  Non-HDL Cholesterol 80   <130  Triglycerides 167   <150  CBC With Platelet And Differential RS In-house   2021-08-31    Absolute Basophils 0.0   0.0-0.1  Absolute Eosinophils 0.1   0.0-0.6  Absolute Lymphocytes 2.5   0.9-3.6  Absolute Monocytes 0.6   0.3-1.0  Absolute Neutrophils 3.9   2.0-8.2  Basophils Automated 0.3   0.0-2.0  Eosinophils Automated 2.0   0.0-7.0  Hematocrit 47.2   37.0-47.0  Hemoglobin 15.7   13.5-18.0  Lymphocytes Automated 34.9   20.5-51.1  MCH 30.1   26.0-33.0  MCHC 33.3   32.0-36.0  MCV 90.4   80.0-100.0  Monocytes Automated 8.5   5.0-12.0  Neutrophils Automated 54.3   42.2-75.2  Platelet Count 195   140-400  RBC 5.22   4.20-5.40  RDW 46.1      WBC 7.1   4.5-11.0  Comprehensive Metabolic Panel RS In-house   2021-08-31    Albumin 3.8   3.4-5.0   Albumin/Globulin Ratio 1.3   1.1-2.5  Alkaline Phosphatase 54   25-150  ALT (SGPT) 34   <6-78  AST (SGOT) 22   0-40  Bilirubin, Total 0.7   0.2-1.0  BUN 23   7-18  BUN/Creatinine Ratio 14.9   11.0-26.0  Calcium 9.4   8.5-10.1  Chloride 102   98-107  CO2 31   21-32  Creatinine 1.54   0.70-1.30  GFR/Black 46   >59  GFR/White 40   >59  Globulin, Calculated 2.9   1.5-4.6  Glucose 95   74-106  Potassium 3.7   3.5-5.1  Protein 6.7   6.4-8.2  Sodium 142   136-145     IMPRESSION:    ICD-10-CM   1. Nonrheumatic aortic valve stenosis  I35.0 ECHOCARDIOGRAM COMPLETE    2. Conduction disorder of the heart  I45.9 EKG 12-Lead    3. Benign hypertension  I10 EKG 12-Lead    losartan (COZAAR) 25 MG tablet    Basic metabolic panel    4. Hx of transient ischemic attack (TIA)  Z86.73     5. Mixed hyperlipidemia  E78.2        RECOMMENDATIONS: Azavier Tosti is a 89 y.o. Caucasian male whose past medical history and cardiac risk factors include: Hypertension, history of TIA, family history of heart disease, history of alcohol abuse, hyperlipidemia, subarachnoid hematoma, PVCs, first-degree AV block, history of PACs, advanced age.  Nonrheumatic aortic valve stenosis Discovered in May 2023. Repeat echocardiogram to reevaluate his disease progression. Denies anginal chest pain, heart failure symptoms, or syncope. His dyspnea on exertion/lightheaded and dizziness needs to be monitored carefully.  For now would like to discontinue hydrochlorothiazide and triamterene combination will start with low-dose losartan. Patient and wife are asked to be more cognizant of the symptoms and if progressive should go to the ER for further evaluation and management.  Conduction disorder of the heart No history of prolonged first-degree AV block as well as AV node illustrating Herbie Saxon physiology. EKG today shows sinus rhythm with prolonged first-degree AV block. Has been evaluated by Dr. Ladona Ridgel in  the past who is following him peripherally. If the symptoms continue we will need to have him be reevaluated for possible pacemaker.  Benign hypertension History of hypertension. Currently hypotensive at office and at home. Medication changes as discussed above. Monitor for now  Mixed hyperlipidemia Currently on atorvastatin. Currently managed by primary care provider.  FINAL MEDICATION LIST END OF ENCOUNTER: Meds ordered this encounter  Medications  losartan (COZAAR) 25 MG tablet    Sig: Take 1 tablet (25 mg total) by mouth every morning.    Dispense:  30 tablet    Refill:  0    Medications Discontinued During This Encounter  Medication Reason   triamterene-hydrochlorothiazide (MAXZIDE-25) 37.5-25 MG per tablet    potassium chloride (KLOR-CON) 10 MEQ tablet      Current Outpatient Medications:    alendronate (FOSAMAX) 70 MG tablet, Take 70 mg by mouth once a week., Disp: , Rfl:    atorvastatin (LIPITOR) 40 MG tablet, Take 40 mg by mouth daily., Disp: , Rfl:    buPROPion (WELLBUTRIN XL) 300 MG 24 hr tablet, Take 300 mg by mouth daily., Disp: , Rfl:    CALCIUM PO, Take 1 tablet by mouth daily., Disp: , Rfl:    Cholecalciferol (VITAMIN D) 125 MCG (5000 UT) CAPS, Take 5,000 Units by mouth daily., Disp: , Rfl:    dipyridamole-aspirin (AGGRENOX) 200-25 MG 12hr capsule, Take 1 capsule by mouth 2 (two) times daily., Disp: , Rfl:    losartan (COZAAR) 25 MG tablet, Take 1 tablet (25 mg total) by mouth every morning., Disp: 30 tablet, Rfl: 0   Multiple Vitamin (MULTIVITAMIN WITH MINERALS) TABS tablet, Take 1 tablet by mouth daily., Disp: , Rfl:    sertraline (ZOLOFT) 50 MG tablet, Take 50 mg by mouth daily., Disp: , Rfl:   Orders Placed This Encounter  Procedures   Basic metabolic panel   EKG 12-Lead   ECHOCARDIOGRAM COMPLETE    There are no Patient Instructions on file for this visit.   --Continue cardiac medications as reconciled in final medication list. --Return in about 6  weeks (around 01/16/2023) for Follow up conduction disease and valvular heart disease. . Or sooner if needed. --Continue follow-up with your primary care physician regarding the management of your other chronic comorbid conditions.  Patient's questions and concerns were addressed to his satisfaction. He voices understanding of the instructions provided during this encounter.   This note was created using a voice recognition software as a result there may be grammatical errors inadvertently enclosed that do not reflect the nature of this encounter. Every attempt is made to correct such errors.  Tessa Lerner, [place], [facility]  Pager:  [phone] Office: [phone]

## 2022-12-09 ENCOUNTER — Other Ambulatory Visit: Payer: Self-pay | Admitting: Cardiology

## 2022-12-09 DIAGNOSIS — I10 Essential (primary) hypertension: Secondary | ICD-10-CM | POA: Diagnosis not present

## 2022-12-10 LAB — BASIC METABOLIC PANEL
BUN/Creatinine Ratio: 17 (ref 10–24)
BUN: 21 mg/dL (ref 8–27)
CO2: 27 mmol/L (ref 20–29)
Calcium: 9.5 mg/dL (ref 8.6–10.2)
Chloride: 105 mmol/L (ref 96–106)
Creatinine, Ser: 1.24 mg/dL (ref 0.76–1.27)
Glucose: 81 mg/dL (ref 70–99)
Potassium: 4.1 mmol/L (ref 3.5–5.2)
Sodium: 144 mmol/L (ref 134–144)
eGFR: 56 mL/min/{1.73_m2} — ABNORMAL LOW (ref >59)

## 2022-12-19 ENCOUNTER — Ambulatory Visit (HOSPITAL_COMMUNITY)
Admission: RE | Admit: 2022-12-19 | Discharge: 2022-12-19 | Disposition: A | Discharge status: Home / Self Care | Payer: Medicare Other | Source: Ambulatory Visit | Attending: Cardiology | Admitting: Cardiology

## 2022-12-19 DIAGNOSIS — I083 Combined rheumatic disorders of mitral, aortic and tricuspid valves: Secondary | ICD-10-CM | POA: Diagnosis not present

## 2022-12-19 DIAGNOSIS — E785 Hyperlipidemia, unspecified: Secondary | ICD-10-CM | POA: Insufficient documentation

## 2022-12-19 DIAGNOSIS — Z8673 Personal history of transient ischemic attack (TIA), and cerebral infarction without residual deficits: Secondary | ICD-10-CM | POA: Diagnosis not present

## 2022-12-19 DIAGNOSIS — I350 Nonrheumatic aortic (valve) stenosis: Secondary | ICD-10-CM | POA: Diagnosis present

## 2022-12-19 DIAGNOSIS — I440 Atrioventricular block, first degree: Secondary | ICD-10-CM | POA: Insufficient documentation

## 2022-12-19 DIAGNOSIS — I089 Rheumatic multiple valve disease, unspecified: Secondary | ICD-10-CM | POA: Diagnosis not present

## 2022-12-20 NOTE — Progress Notes (Signed)
 Called patient, NA, no VM-box to leave a message.

## 2022-12-20 NOTE — Progress Notes (Signed)
Patient called back, I have discussed results with him and his wife.

## 2022-12-21 ENCOUNTER — Ambulatory Visit: Payer: Medicare Other | Admitting: Podiatry

## 2022-12-21 DIAGNOSIS — M79674 Pain in right toe(s): Secondary | ICD-10-CM

## 2022-12-21 DIAGNOSIS — M79675 Pain in left toe(s): Secondary | ICD-10-CM

## 2022-12-21 DIAGNOSIS — B351 Tinea unguium: Secondary | ICD-10-CM | POA: Diagnosis not present

## 2022-12-21 NOTE — Progress Notes (Signed)
  Subjective:  Patient ID: Peter Gallegos, male    DOB: [birth year],  MRN: [id number]  Chief Complaint  Patient presents with   Nail Problem    Nail trim    89 y.o. male returns for the above complaint.  Patient presents with thickened elongated dystrophic onychomycosis bilaterally x10.  They are painful to palpation.  Patient states that they are very hard to and painful to ambulate on.  Patient would like to have the repair done.  He has no other acute concerns.  Objective:   There were no vitals filed for this visit.  Podiatric Exam: Vascular: dorsalis pedis and posterior tibial pulses are palpable bilateral. Capillary return is immediate. Temperature gradient is WNL. Skin turgor WNL  Sensorium: Normal Semmes Weinstein monofilament test. Normal tactile sensation bilaterally. Nail Exam: Pt has thick disfigured discolored nails with subungual debris noted bilateral entire nail hallux through fifth toenails.  Pain on palpation to the nails. Ulcer Exam: There is no evidence of ulcer or pre-ulcerative changes or infection. Orthopedic Exam: Muscle tone and strength are WNL. No limitations in general ROM. No crepitus or effusions noted. HAV  B/L.  Hammer toes 2-5  B/L. Skin: Hyperkeratotic lesion noted to bilateral medial aspect of the hallux.  No pinpoint bleeding noted.  Pain on palpation to the lesion. No infection or ulcers.  Dry skin without fissure or pain to the bilateral lower extremity    Assessment & Plan:   1. Pain due to onychomycosis of toenails of both feet        Patient was evaluated and treated and all questions answered.  Xerosis bilateral lower extremity -I explained to the patient the etiology of xerosis and various treatment options were extensively discussed.  I explained to the patient the importance of maintaining moisturization of the skin with application of over-the-counter lotion such as Eucerin or Luciderm.  I have asked the patient to apply this twice a  day.  If unable to resolve patient will benefit from prescription lotion.  Porokeratosis bilateral hallux x2 -I explained to patient the etiology of porokeratosis versus treatment options were discussed.  Given the amount of pain that is having I believe patient will benefit from aggressive debridement of the lesion.  Using chisel blade and handle the lesions were debrided down to healthy striated tissue.  No complication noted.  No pinpoint bleeding noted  Onychomycosis with pain  -Nails palliatively debrided as below. -Educated on self-care  Procedure: Nail Debridement Rationale: pain  Type of Debridement: manual, sharp debridement. Instrumentation: Nail nipper, rotary burr. Number of Nails: 10  Procedures and Treatment: Consent by patient was obtained for treatment procedures. The patient understood the discussion of treatment and procedures well. All questions were answered thoroughly reviewed. Debridement of mycotic and hypertrophic toenails, 1 through 5 bilateral and clearing of subungual debris. No ulceration, no infection noted.  Return Visit-Office Procedure: Patient instructed to return to the office for a follow up visit 3 months for continued evaluation and treatment.  Nicholes Rough, DPM    No follow-ups on file.

## 2022-12-22 LAB — ECHOCARDIOGRAM COMPLETE
AR max vel: 0.92 cm2
AV Area VTI: 0.93 cm2
AV Area mean vel: 0.85 cm2
AV Mean grad: 12 mmHg
AV Peak grad: 20 mmHg
Ao pk vel: 2.23 m/s
Area-P 1/2: 3.43 cm2
P 1/2 time: 624 msec
S' Lateral: 2 cm

## 2023-01-05 ENCOUNTER — Other Ambulatory Visit: Payer: Self-pay | Admitting: Cardiology

## 2023-01-05 DIAGNOSIS — I10 Essential (primary) hypertension: Secondary | ICD-10-CM

## 2023-01-08 DIAGNOSIS — I350 Nonrheumatic aortic (valve) stenosis: Secondary | ICD-10-CM | POA: Insufficient documentation

## 2023-01-08 NOTE — Addendum Note (Signed)
Encounter addended by: Tessa Lerner, DO on: 01/08/2023 6:21 PM  Actions taken: Problem List modified, Charge Capture section accepted

## 2023-01-13 ENCOUNTER — Encounter: Payer: Self-pay | Admitting: Cardiology

## 2023-01-13 ENCOUNTER — Ambulatory Visit: Payer: Medicare Other | Admitting: Cardiology

## 2023-01-13 VITALS — BP 134/70 | HR 68 | Resp 16 | Ht 66.0 in | Wt 146.6 lb

## 2023-01-13 DIAGNOSIS — R0609 Other forms of dyspnea: Secondary | ICD-10-CM | POA: Diagnosis not present

## 2023-01-13 DIAGNOSIS — E782 Mixed hyperlipidemia: Secondary | ICD-10-CM | POA: Diagnosis not present

## 2023-01-13 DIAGNOSIS — Z8673 Personal history of transient ischemic attack (TIA), and cerebral infarction without residual deficits: Secondary | ICD-10-CM | POA: Diagnosis not present

## 2023-01-13 DIAGNOSIS — I350 Nonrheumatic aortic (valve) stenosis: Secondary | ICD-10-CM | POA: Diagnosis not present

## 2023-01-13 DIAGNOSIS — I459 Conduction disorder, unspecified: Secondary | ICD-10-CM | POA: Diagnosis not present

## 2023-01-13 DIAGNOSIS — I10 Essential (primary) hypertension: Secondary | ICD-10-CM

## 2023-01-13 NOTE — Progress Notes (Signed)
ID:  Trigo Balakrishnan, DOB 28-Sep-1932, MRN [id number]  PCP:  Merri Brunette, MD  Cardiologist:  Tessa Lerner, DO, [facility] (established care 09/20/2021)  Date: 01/13/23 Last Office Visit: 12/02/2022  Chief Complaint  Patient presents with   Follow-up   conduction disease    valvular heart disease    HPI  Kingsley Sklar is a 89 y.o. Caucasian male whose past medical history and cardiovascular risk factors include: Aortic stenosis, hypertension, history of TIA, family history of heart disease, history of alcohol abuse, hyperlipidemia, subarachnoid hematoma, PVCs, first-degree AV block, history of PACs, advanced age  Prior to his cataract surgery he did have an EKG which was concerning for possible conduction disease was referred to cardiology for further evaluation and management.  Since establishing care he has undergone GXT which illustrated good chronotropic competence and Zio patch did not illustrate any significant dysrhythmias/pauses.  He has been evaluated by EP (Dr. Ladona Ridgel) who recommended monitoring for now but at some point may need pacemaker.  At the last office visit in June 2024 which is a 67-month follow-up patient was complaining of experiencing lightheadedness, dizziness, and shortness of breath when going up a flight of stairs.  He denies near-syncope or syncopal events.  During that visit as vital signs noted hypotension and therefore recommended to discontinue triamterene and hydrochlorothiazide to see if the symptoms were improved.  Given his underlying valvular heart disease follow-up echocardiogram was also recommended.  Patient presents today for follow-up.  He is accompanied by his wife at today's visit.  Since last office visit lightheaded and dizziness has resolved in his home and office blood pressures have significantly improved.  However, he continues to have shortness of breath with effort related activities.  No near-syncope or syncopal events.  He denies heart  failure symptoms.  In addition, he has had 2 mechanical falls since last office visit.  FUNCTIONAL STATUS: No structured exercise program or daily routine.   ALLERGIES: No Known Allergies  MEDICATION LIST PRIOR TO VISIT: Current Meds  Medication Sig   alendronate (FOSAMAX) 70 MG tablet Take 70 mg by mouth once a week.   atorvastatin (LIPITOR) 40 MG tablet Take 40 mg by mouth daily.   buPROPion (WELLBUTRIN XL) 300 MG 24 hr tablet Take 300 mg by mouth daily.   CALCIUM PO Take 1 tablet by mouth daily.   Cholecalciferol (VITAMIN D) 125 MCG (5000 UT) CAPS Take 5,000 Units by mouth daily.   dipyridamole-aspirin (AGGRENOX) 200-25 MG 12hr capsule Take 1 capsule by mouth 2 (two) times daily.   losartan (COZAAR) 25 MG tablet TAKE 1 TABLET BY MOUTH ONCE DAILY IN THE MORNING   Multiple Vitamin (MULTIVITAMIN WITH MINERALS) TABS tablet Take 1 tablet by mouth daily.   sertraline (ZOLOFT) 50 MG tablet Take 50 mg by mouth daily.     PAST MEDICAL HISTORY: Past Medical History:  Diagnosis Date   Anxiety and depression    Depression    Diverticulosis    Family history of coronary artery disease    GERD (gastroesophageal reflux disease)    History of arm fracture    Rt arm, LT elbow fracture   History of colon polyps    History of ETOH abuse    Quit in 1980   Hypercholesterolemia    Hypertension    PONV (postoperative nausea and vomiting)    TIA (transient ischemic attack) 12/2005    PAST SURGICAL HISTORY: Past Surgical History:  Procedure Laterality Date   COLONOSCOPY  OPEN REDUCTION INTERNAL FIXATION (ORIF) DISTAL PHALANX Left 11/21/2013   Procedure: OPEN REDUCTION INTERNAL FIXATION (ORIF) DISTAL PHALANX;  Surgeon: Sharma Covert, MD;  Location: [facility] OR;  Service: Orthopedics;  Laterality: Left;   ORIF WRIST FRACTURE Right 11/25/2020   Procedure: OPEN REDUCTION INTERNAL FIXATION WRIST FRACTURE;  Surgeon: Dominica Severin, MD;  Location: [facility] OR;  Service: Orthopedics;  Laterality: Right;     FAMILY HISTORY: The patient family history includes Heart disease in his father and mother.  SOCIAL HISTORY:  The patient  reports that he quit smoking about 38 years ago. His smoking use included pipe. He has never used smokeless tobacco. He reports that he does not drink alcohol and does not use drugs.  REVIEW OF SYSTEMS: Review of Systems  Constitutional: Negative for malaise/fatigue.  Cardiovascular:  Positive for dyspnea on exertion (Still present, not progressive). Negative for chest pain, leg swelling, near-syncope, orthopnea, palpitations, paroxysmal nocturnal dyspnea and syncope.  Respiratory:  Negative for shortness of breath.   Neurological:  Negative for dizziness and light-headedness.    PHYSICAL EXAM:    01/13/2023   11:31 AM 12/02/2022   10:12 AM 06/08/2022    2:21 PM  Vitals with BMI  Height 5\' 6"  5\' 6"    Weight 146 lbs 10 oz 139 lbs   BMI 23.67 22.45   Systolic 134 99 138  Diastolic 70 63 72  Pulse 68 74    Physical Exam  Constitutional: No distress.  Age appropriate, hemodynamically stable.   Neck: No JVD present.  Cardiovascular: Normal rate, regular rhythm, S1 normal, S2 normal, intact distal pulses and normal pulses. Exam reveals no gallop, no S3 and no S4.  Murmur heard. Midsystolic murmur is present with a grade of 3/6 at the upper right sternal border. Pulmonary/Chest: Effort normal and breath sounds normal. No stridor. He has no wheezes. He has no rales.  Abdominal: Soft. Bowel sounds are normal. He exhibits no distension. There is no abdominal tenderness.  Musculoskeletal:        General: No edema.     Cervical back: Neck supple.  Neurological: He is alert and oriented to person, place, and time. He has intact cranial nerves (2-12).  Skin: Skin is warm and moist.   CARDIAC DATABASE: EKG: 09/20/2021: Normal sinus rhythm, prolonged first-degree AV block, without underlying injury pattern. 06/02/2022: Sinus rhythm, 67 bpm, second-degree type I  Wenckebach physiology. December 02, 2022: Sinus rhythm with 66 bpm, prolonged first-degree AV block, diffuse T wave abnormality.  Echocardiogram: 12/19/2022 1. Left ventricular ejection fraction, by estimation, is 60 to 65%. The left ventricle has normal function. The left ventricle has no regional wall motion abnormalities. suboptimal images. 2. Right ventricular systolic function is normal. The right ventricular size is normal. 3. Left atrial size was upper limit of normal. 4. The mitral valve is grossly normal. Mild mitral valve regurgitation. No evidence of mitral stenosis. 5. Tricuspid valve regurgitation is mild to moderate. 6. Native aortic valve, severe annular and leaflet calcification, degenerative, trileaflet. Mild aortic regurgitation. Findings suggestive of paradoxical low flow, low gradient aortic stenosis (preserved LVEF, peak velocity 2.11m/s, AVA0.93cm2, PG 20 mmHG, MG , iSV 92ml/m2).  Comparison(s): Prior study 10/12/2021: LVEF 61%, G1DD, moderate LAE, Moderate AS, mild MR.    Stress Testing: No results found for this or any previous visit from the past 1095 days.   Heart Catheterization: None  Cardiac monitor (Zio Patch): Patch Wear Time:  6 days and 23 hours  Dominant rhythm sinus with first-degree AV  block. Heart rate 30-102 bpm.  Avg HR 68 bpm. No atrial fibrillation, supraventricular tachycardia, ventricular tachycardia, high grade AV block, pauses (3 seconds or longer). Minimum HR 30 bpm, asymptomatic, on 09/26/2021, at 11:20 PM, sinus with second-degree type I AV block. Total ventricular ectopic burden <1%. Total supraventricular ectopic burden 0%. Patient triggered events: 0.  LABORATORY DATA: Name Result Date Reference Range  Lipid Panel   2021-08-31    Cholesterol 153   <200  Cholesterol / HDL Ratio 2.10   0.00-4.99  HDL Cholesterol 73   >39  LDL Cholesterol (Calculation) 47   <130  LDL/HDL Ratio 0.6   <3.3  Non-HDL Cholesterol 80   <130   Triglycerides 167   <150  CBC With Platelet And Differential RS In-house   2021-08-31    Absolute Basophils 0.0   0.0-0.1  Absolute Eosinophils 0.1   0.0-0.6  Absolute Lymphocytes 2.5   0.9-3.6  Absolute Monocytes 0.6   0.3-1.0  Absolute Neutrophils 3.9   2.0-8.2  Basophils Automated 0.3   0.0-2.0  Eosinophils Automated 2.0   0.0-7.0  Hematocrit 47.2   37.0-47.0  Hemoglobin 15.7   13.5-18.0  Lymphocytes Automated 34.9   20.5-51.1  MCH 30.1   26.0-33.0  MCHC 33.3   32.0-36.0  MCV 90.4   80.0-100.0  Monocytes Automated 8.5   5.0-12.0  Neutrophils Automated 54.3   42.2-75.2  Platelet Count 195   140-400  RBC 5.22   4.20-5.40  RDW 46.1      WBC 7.1   4.5-11.0  Comprehensive Metabolic Panel RS In-house   2021-08-31    Albumin 3.8   3.4-5.0  Albumin/Globulin Ratio 1.3   1.1-2.5  Alkaline Phosphatase 54   25-150  ALT (SGPT) 34   <6-78  AST (SGOT) 22   0-40  Bilirubin, Total 0.7   0.2-1.0  BUN 23   7-18  BUN/Creatinine Ratio 14.9   11.0-26.0  Calcium 9.4   8.5-10.1  Chloride 102   98-107  CO2 31   21-32  Creatinine 1.54   0.70-1.30  GFR/Black 46   >59  GFR/White 40   >59  Globulin, Calculated 2.9   1.5-4.6  Glucose 95   74-106  Potassium 3.7   3.5-5.1  Protein 6.7   6.4-8.2  Sodium 142   136-145      Latest Ref Rng & Units 12/09/2022    8:28 AM 11/24/2020    2:54 AM 11/23/2020    4:35 AM  BMP  Glucose 70 - 99 mg/dL 81  284  132   BUN 8 - 27 mg/dL 21  17  16    Creatinine 0.76 - 1.27 mg/dL 4.40  1.02  7.25   BUN/Creat Ratio 10 - 24 17     Sodium 134 - 144 mmol/L 144  135  138   Potassium 3.5 - 5.2 mmol/L 4.1  3.4  3.3   Chloride 96 - 106 mmol/L 105  102  99   CO2 20 - 29 mmol/L 27  26  28    Calcium 8.6 - 10.2 mg/dL 9.5  8.4  8.8      IMPRESSION:    ICD-10-CM   1. Dyspnea on exertion  R06.09     2. Nonrheumatic aortic valve stenosis  I35.0     3. Conduction disorder of the heart  I45.9     4. Benign hypertension  I10     5. Hx of transient ischemic attack  (TIA)  Z86.73     6. Mixed  hyperlipidemia  E78.2        RECOMMENDATIONS: Jehad Baack is a 89 y.o. Caucasian male whose past medical history and cardiac risk factors include: Hypertension, history of TIA, family history of heart disease, history of alcohol abuse, hyperlipidemia, subarachnoid hematoma, PVCs, first-degree AV block, history of PACs, advanced age.  Dyspnea on exertion Nonrheumatic aortic valve stenosis Conduction disorder of the heart Patient continues to have dyspnea on exertion which may be multifactorial which includes but not limited to: Secondary to underlying valvular disease, conduction disease, deconditioning. Repeat echocardiogram notes preserved LVEF but findings suggestive of paradoxical low-flow low gradient aortic stenosis. Patient reassures me that both falls since last visit have been mechanical and not precipitated by syncope. Had a long discussion with both the patient and his wife regarding the differential diagnosis of his dyspnea. Clinical suspicion is that he is becoming more symptomatic given his underlying aortic valve disease.  In the recent past he had a Zio patch which noted good heart rate variability, average heart rate was 68 bpm.  His prior GXT in June 2023 illustrated him achieving at least 7 METS of physical activity and 84% APMHR. If we proceed with aortic valve evaluation the next step would be left and right heart catheterization to evaluate for obstructive disease and hemodynamics. Shared decision was to continue conservative management for now and they will follow-up with PCP to see if there is any noncardiac causes of dyspnea on exertion. Patient and wife would like to reconvene in the next 4 to 5 weeks to readdress symptoms and to formulate a plan of care.  Benign hypertension Office and home blood pressures are better controlled after discontinuation of hydrochlorothiazide/triamterene. He is tolerating losartan 25 mg p.o. daily well  without any side effects or intolerances. Monitor for now  Hx of transient ischemic attack (TIA) Reemphasized importance of secondary prevention  Mixed hyperlipidemia Continue atorvastatin. Lipids currently managed by PCP.  Nonrheumatic aortic valve stenosis Discovered in May 2023. Repeat echocardiogram to reevaluate his disease progression. Denies anginal chest pain, heart failure symptoms, or syncope. His dyspnea on exertion/lightheaded and dizziness needs to be monitored carefully.  For now would like to discontinue hydrochlorothiazide and triamterene combination will start with low-dose losartan. Patient and wife are asked to be more cognizant of the symptoms and if progressive should go to the ER for further evaluation and management.  Conduction disorder of the heart No history of prolonged first-degree AV block as well as AV node illustrating Herbie Saxon physiology. EKG today shows sinus rhythm with prolonged first-degree AV block. Has been evaluated by Dr. Ladona Ridgel in the past who is following him peripherally. If the symptoms continue we will need to have him be reevaluated for possible pacemaker.  Benign hypertension History of hypertension. Currently hypotensive at office and at home. Medication changes as discussed above. Monitor for now  Mixed hyperlipidemia Currently on atorvastatin. Currently managed by primary care provider.  FINAL MEDICATION LIST END OF ENCOUNTER: No orders of the defined types were placed in this encounter.   There are no discontinued medications.    Current Outpatient Medications:    alendronate (FOSAMAX) 70 MG tablet, Take 70 mg by mouth once a week., Disp: , Rfl:    atorvastatin (LIPITOR) 40 MG tablet, Take 40 mg by mouth daily., Disp: , Rfl:    buPROPion (WELLBUTRIN XL) 300 MG 24 hr tablet, Take 300 mg by mouth daily., Disp: , Rfl:    CALCIUM PO, Take 1 tablet by mouth daily., Disp: ,  Rfl:    Cholecalciferol (VITAMIN D) 125 MCG (5000 UT)  CAPS, Take 5,000 Units by mouth daily., Disp: , Rfl:    dipyridamole-aspirin (AGGRENOX) 200-25 MG 12hr capsule, Take 1 capsule by mouth 2 (two) times daily., Disp: , Rfl:    losartan (COZAAR) 25 MG tablet, TAKE 1 TABLET BY MOUTH ONCE DAILY IN THE MORNING, Disp: 30 tablet, Rfl: 0   Multiple Vitamin (MULTIVITAMIN WITH MINERALS) TABS tablet, Take 1 tablet by mouth daily., Disp: , Rfl:    sertraline (ZOLOFT) 50 MG tablet, Take 50 mg by mouth daily., Disp: , Rfl:   No orders of the defined types were placed in this encounter.   There are no Patient Instructions on file for this visit.   --Continue cardiac medications as reconciled in final medication list. --Return in about 5 weeks (around 02/17/2023) for Follow up, Dyspnea, discuss valve disease. Or sooner if needed. --Continue follow-up with your primary care physician regarding the management of your other chronic comorbid conditions.  Patient's questions and concerns were addressed to his satisfaction. He voices understanding of the instructions provided during this encounter.   This note was created using a voice recognition software as a result there may be grammatical errors inadvertently enclosed that do not reflect the nature of this encounter. Every attempt is made to correct such errors.  Tessa Lerner, [place], [facility]  Pager:  [phone] Office: [phone]

## 2023-02-08 ENCOUNTER — Other Ambulatory Visit: Payer: Self-pay | Admitting: Cardiology

## 2023-02-08 DIAGNOSIS — I10 Essential (primary) hypertension: Secondary | ICD-10-CM

## 2023-02-17 ENCOUNTER — Encounter: Payer: Self-pay | Admitting: Cardiology

## 2023-02-17 ENCOUNTER — Ambulatory Visit: Payer: Medicare Other | Admitting: Cardiology

## 2023-02-17 VITALS — BP 139/82 | HR 75 | Resp 16 | Ht 66.0 in | Wt 148.0 lb

## 2023-02-17 DIAGNOSIS — I350 Nonrheumatic aortic (valve) stenosis: Secondary | ICD-10-CM | POA: Diagnosis not present

## 2023-02-17 DIAGNOSIS — I10 Essential (primary) hypertension: Secondary | ICD-10-CM

## 2023-02-17 DIAGNOSIS — I459 Conduction disorder, unspecified: Secondary | ICD-10-CM | POA: Diagnosis not present

## 2023-02-17 DIAGNOSIS — E782 Mixed hyperlipidemia: Secondary | ICD-10-CM

## 2023-02-17 DIAGNOSIS — R0609 Other forms of dyspnea: Secondary | ICD-10-CM

## 2023-02-17 NOTE — Progress Notes (Signed)
ID:  Peter Gallegos, DOB 08-22-1932, MRN [id number]  PCP:  Merri Brunette, MD  Cardiologist:  Tessa Lerner, DO, [facility] (established care 09/20/2021)  Date: 02/17/23 Last Office Visit: 01/13/2023  Chief Complaint  Patient presents with   Shortness of Breath   Follow-up    HPI  Peter Gallegos is a 89 y.o. Caucasian male whose past medical history and cardiovascular risk factors include: Aortic stenosis, hypertension, history of TIA, family history of heart disease, history of alcohol abuse, hyperlipidemia, subarachnoid hematoma, PVCs, first-degree AV block, history of PACs, advanced age  In the past he would have episodes of lightheaded and dizziness which shortly resolved after discontinuation of triamterene and hydrochlorothiazide.  However, shortness of breath with effort related activities remains chronic and stable.  He denies heart failure symptoms, angina pectoris, or syncope.  Unfortunately, he has a history of mechanical falls.  Since last office visit he has not had any symptoms of heart failure, near-syncope or syncopal events.  He would like to hold off on proceeding forward with aortic valve replacement at this time which is quite reasonable as her symptoms have improved after change in medical therapy.  FUNCTIONAL STATUS: No structured exercise program or daily routine.   ALLERGIES: No Known Allergies  MEDICATION LIST PRIOR TO VISIT: Current Meds  Medication Sig   alendronate (FOSAMAX) 70 MG tablet Take 70 mg by mouth once a week.   atorvastatin (LIPITOR) 40 MG tablet Take 40 mg by mouth daily.   buPROPion (WELLBUTRIN XL) 300 MG 24 hr tablet Take 300 mg by mouth daily.   CALCIUM PO Take 1 tablet by mouth daily.   Cholecalciferol (VITAMIN D) 125 MCG (5000 UT) CAPS Take 5,000 Units by mouth daily.   dipyridamole-aspirin (AGGRENOX) 200-25 MG 12hr capsule Take 1 capsule by mouth 2 (two) times daily.   losartan (COZAAR) 25 MG tablet TAKE 1 TABLET BY MOUTH ONCE DAILY IN  THE MORNING   Multiple Vitamin (MULTIVITAMIN WITH MINERALS) TABS tablet Take 1 tablet by mouth daily.   sertraline (ZOLOFT) 50 MG tablet Take 50 mg by mouth daily.     PAST MEDICAL HISTORY: Past Medical History:  Diagnosis Date   Anxiety and depression    Depression    Diverticulosis    Family history of coronary artery disease    GERD (gastroesophageal reflux disease)    History of arm fracture    Rt arm, LT elbow fracture   History of colon polyps    History of ETOH abuse    Quit in 1980   Hypercholesterolemia    Hypertension    PONV (postoperative nausea and vomiting)    TIA (transient ischemic attack) 12/2005    PAST SURGICAL HISTORY: Past Surgical History:  Procedure Laterality Date   COLONOSCOPY     OPEN REDUCTION INTERNAL FIXATION (ORIF) DISTAL PHALANX Left 11/21/2013   Procedure: OPEN REDUCTION INTERNAL FIXATION (ORIF) DISTAL PHALANX;  Surgeon: Sharma Covert, MD;  Location: [facility] OR;  Service: Orthopedics;  Laterality: Left;   ORIF WRIST FRACTURE Right 11/25/2020   Procedure: OPEN REDUCTION INTERNAL FIXATION WRIST FRACTURE;  Surgeon: Dominica Severin, MD;  Location: [facility] OR;  Service: Orthopedics;  Laterality: Right;    FAMILY HISTORY: The patient family history includes Heart disease in his father and mother.  SOCIAL HISTORY:  The patient  reports that he quit smoking about 38 years ago. His smoking use included pipe. He has never used smokeless tobacco. He reports that he does not drink alcohol and does not  use drugs.  REVIEW OF SYSTEMS: Review of Systems  Constitutional: Negative for malaise/fatigue.  Cardiovascular:  Positive for dyspnea on exertion (Still present, not progressive). Negative for chest pain, leg swelling, near-syncope, orthopnea, palpitations, paroxysmal nocturnal dyspnea and syncope.  Respiratory:  Negative for shortness of breath.   Neurological:  Negative for dizziness and light-headedness.    PHYSICAL EXAM:    02/17/2023   12:33 PM 01/13/2023    11:31 AM 12/02/2022   10:12 AM  Vitals with BMI  Height 5\' 6"  5\' 6"  5\' 6"   Weight 148 lbs 146 lbs 10 oz 139 lbs  BMI 23.9 23.67 22.45  Systolic 139 134 99  Diastolic 82 70 63  Pulse 75 68 74   Physical Exam  Constitutional: No distress.  Age appropriate, hemodynamically stable.   Neck: No JVD present.  Cardiovascular: Normal rate, regular rhythm, S1 normal, S2 normal, intact distal pulses and normal pulses. Exam reveals no gallop, no S3 and no S4.  Murmur heard. Midsystolic murmur is present with a grade of 3/6 at the upper right sternal border. Pulmonary/Chest: Effort normal and breath sounds normal. No stridor. He has no wheezes. He has no rales.  Abdominal: Soft. Bowel sounds are normal. He exhibits no distension. There is no abdominal tenderness.  Musculoskeletal:        General: No edema.     Cervical back: Neck supple.  Neurological: He is alert and oriented to person, place, and time. He has intact cranial nerves (2-12).  Skin: Skin is warm and moist.   CARDIAC DATABASE: EKG: 09/20/2021: Normal sinus rhythm, prolonged first-degree AV block, without underlying injury pattern. 06/02/2022: Sinus rhythm, 67 bpm, second-degree type I Wenckebach physiology. December 02, 2022: Sinus rhythm with 66 bpm, prolonged first-degree AV block, diffuse T wave abnormality.  Echocardiogram: 12/19/2022 1. Left ventricular ejection fraction, by estimation, is 60 to 65%. The left ventricle has normal function. The left ventricle has no regional wall motion abnormalities. suboptimal images. 2. Right ventricular systolic function is normal. The right ventricular size is normal. 3. Left atrial size was upper limit of normal. 4. The mitral valve is grossly normal. Mild mitral valve regurgitation. No evidence of mitral stenosis. 5. Tricuspid valve regurgitation is mild to moderate. 6. Native aortic valve, severe annular and leaflet calcification, degenerative, trileaflet. Mild aortic regurgitation.  Findings suggestive of paradoxical low flow, low gradient aortic stenosis (preserved LVEF, peak velocity 2.49m/s, AVA0.93cm2, PG 20 mmHG, MG , iSV 43ml/m2).  Comparison(s): Prior study 10/12/2021: LVEF 61%, G1DD, moderate LAE, Moderate AS, mild MR.    Stress Testing: No results found for this or any previous visit from the past 1095 days.   Heart Catheterization: None  Cardiac monitor (Zio Patch): Patch Wear Time:  6 days and 23 hours  Dominant rhythm sinus with first-degree AV block. Heart rate 30-102 bpm.  Avg HR 68 bpm. No atrial fibrillation, supraventricular tachycardia, ventricular tachycardia, high grade AV block, pauses (3 seconds or longer). Minimum HR 30 bpm, asymptomatic, on 09/26/2021, at 11:20 PM, sinus with second-degree type I AV block. Total ventricular ectopic burden <1%. Total supraventricular ectopic burden 0%. Patient triggered events: 0.  LABORATORY DATA: Name Result Date Reference Range  Lipid Panel   2021-08-31    Cholesterol 153   <200  Cholesterol / HDL Ratio 2.10   0.00-4.99  HDL Cholesterol 73   >39  LDL Cholesterol (Calculation) 47   <130  LDL/HDL Ratio 0.6   <3.3  Non-HDL Cholesterol 80   <130  Triglycerides 167   <  150  CBC With Platelet And Differential RS In-house   2021-08-31    Absolute Basophils 0.0   0.0-0.1  Absolute Eosinophils 0.1   0.0-0.6  Absolute Lymphocytes 2.5   0.9-3.6  Absolute Monocytes 0.6   0.3-1.0  Absolute Neutrophils 3.9   2.0-8.2  Basophils Automated 0.3   0.0-2.0  Eosinophils Automated 2.0   0.0-7.0  Hematocrit 47.2   37.0-47.0  Hemoglobin 15.7   13.5-18.0  Lymphocytes Automated 34.9   20.5-51.1  MCH 30.1   26.0-33.0  MCHC 33.3   32.0-36.0  MCV 90.4   80.0-100.0  Monocytes Automated 8.5   5.0-12.0  Neutrophils Automated 54.3   42.2-75.2  Platelet Count 195   140-400  RBC 5.22   4.20-5.40  RDW 46.1      WBC 7.1   4.5-11.0  Comprehensive Metabolic Panel RS In-house   2021-08-31    Albumin 3.8   3.4-5.0   Albumin/Globulin Ratio 1.3   1.1-2.5  Alkaline Phosphatase 54   25-150  ALT (SGPT) 34   <6-78  AST (SGOT) 22   0-40  Bilirubin, Total 0.7   0.2-1.0  BUN 23   7-18  BUN/Creatinine Ratio 14.9   11.0-26.0  Calcium 9.4   8.5-10.1  Chloride 102   98-107  CO2 31   21-32  Creatinine 1.54   0.70-1.30  GFR/Black 46   >59  GFR/White 40   >59  Globulin, Calculated 2.9   1.5-4.6  Glucose 95   74-106  Potassium 3.7   3.5-5.1  Protein 6.7   6.4-8.2  Sodium 142   136-145      Latest Ref Rng & Units 12/09/2022    8:28 AM 11/24/2020    2:54 AM 11/23/2020    4:35 AM  BMP  Glucose 70 - 99 mg/dL 81  952  841   BUN 8 - 27 mg/dL 21  17  16    Creatinine 0.76 - 1.27 mg/dL 3.24  4.01  0.27   BUN/Creat Ratio 10 - 24 17     Sodium 134 - 144 mmol/L 144  135  138   Potassium 3.5 - 5.2 mmol/L 4.1  3.4  3.3   Chloride 96 - 106 mmol/L 105  102  99   CO2 20 - 29 mmol/L 27  26  28    Calcium 8.6 - 10.2 mg/dL 9.5  8.4  8.8      IMPRESSION:    ICD-10-CM   1. Dyspnea on exertion  R06.09     2. Nonrheumatic aortic valve stenosis  I35.0 PCV ECHOCARDIOGRAM COMPLETE    3. Conduction disorder of the heart  I45.9     4. Benign hypertension  I10     5. Mixed hyperlipidemia  E78.2         RECOMMENDATIONS: Lev Ciolek is a 89 y.o. Caucasian male whose past medical history and cardiac risk factors include: Hypertension, history of TIA, family history of heart disease, history of alcohol abuse, hyperlipidemia, subarachnoid hematoma, PVCs, first-degree AV block, history of PACs, advanced age.  Nonrheumatic aortic valve stenosis Noted to have paradoxical low-flow low gradient aortic stenosis. Denies anginal chest pain, heart failure symptoms, near-syncope or syncopal events. His lightheaded dizziness has improved after discontinuation of diuretics and transitioning him to losartan. Shared decision was to hold off on aortic valve replacement as overall his symptoms have improved. Will repeat an  echocardiogram in 6 months to reevaluate disease progression.  Conduction disorder of the heart Prior Zio patch as illustrated good  heart rate variability. Prior GXT in June 2023 illustrated acceptable functional capacity for age, achieved 7 METS and 84% of age-predicted maximum heart rate. At baseline he does have first-degree AV block as well as AV node illustrating Wenckebach physiology. Has been evaluated by Dr. Ladona Ridgel in the past who recommends close monitoring and if symptomatic may need to be evaluated for pacemaker implant.    Dyspnea on exertion Chronic and stable. Have asked him to discuss noncardiac causes of dyspnea with PCP.  Benign hypertension Office blood pressures are acceptable. Tolerating losartan well without any side effects or intolerances. Monitor for now  Mixed hyperlipidemia Currently on atorvastatin. Does not endorse myalgias. Monitor for now. Currently managed by primary care provider.  FINAL MEDICATION LIST END OF ENCOUNTER: No orders of the defined types were placed in this encounter.   There are no discontinued medications.    Current Outpatient Medications:    alendronate (FOSAMAX) 70 MG tablet, Take 70 mg by mouth once a week., Disp: , Rfl:    atorvastatin (LIPITOR) 40 MG tablet, Take 40 mg by mouth daily., Disp: , Rfl:    buPROPion (WELLBUTRIN XL) 300 MG 24 hr tablet, Take 300 mg by mouth daily., Disp: , Rfl:    CALCIUM PO, Take 1 tablet by mouth daily., Disp: , Rfl:    Cholecalciferol (VITAMIN D) 125 MCG (5000 UT) CAPS, Take 5,000 Units by mouth daily., Disp: , Rfl:    dipyridamole-aspirin (AGGRENOX) 200-25 MG 12hr capsule, Take 1 capsule by mouth 2 (two) times daily., Disp: , Rfl:    losartan (COZAAR) 25 MG tablet, TAKE 1 TABLET BY MOUTH ONCE DAILY IN THE MORNING, Disp: 30 tablet, Rfl: 0   Multiple Vitamin (MULTIVITAMIN WITH MINERALS) TABS tablet, Take 1 tablet by mouth daily., Disp: , Rfl:    sertraline (ZOLOFT) 50 MG tablet, Take 50 mg by  mouth daily., Disp: , Rfl:   Orders Placed This Encounter  Procedures   PCV ECHOCARDIOGRAM COMPLETE    There are no Patient Instructions on file for this visit.   --Continue cardiac medications as reconciled in final medication list. --Return in about 5 months (around 07/06/2023) for Follow up aortic stenosis, status post echocardiogram January 2025. Or sooner if needed. --Continue follow-up with your primary care physician regarding the management of your other chronic comorbid conditions.  Patient's questions and concerns were addressed to his satisfaction. He voices understanding of the instructions provided during this encounter.   This note was created using a voice recognition software as a result there may be grammatical errors inadvertently enclosed that do not reflect the nature of this encounter. Every attempt is made to correct such errors.  Tessa Lerner, [place], [facility]  Pager:  [phone] Office: [phone]

## 2023-03-05 IMAGING — CR DG HAND COMPLETE 3+V*L*
3 series · 3 of 3 positions shown · non-contrast
Comparison: None.

CLINICAL DATA: Status post fall.

EXAM:
LEFT HAND - COMPLETE 3+ VIEW

[x hand pa left]
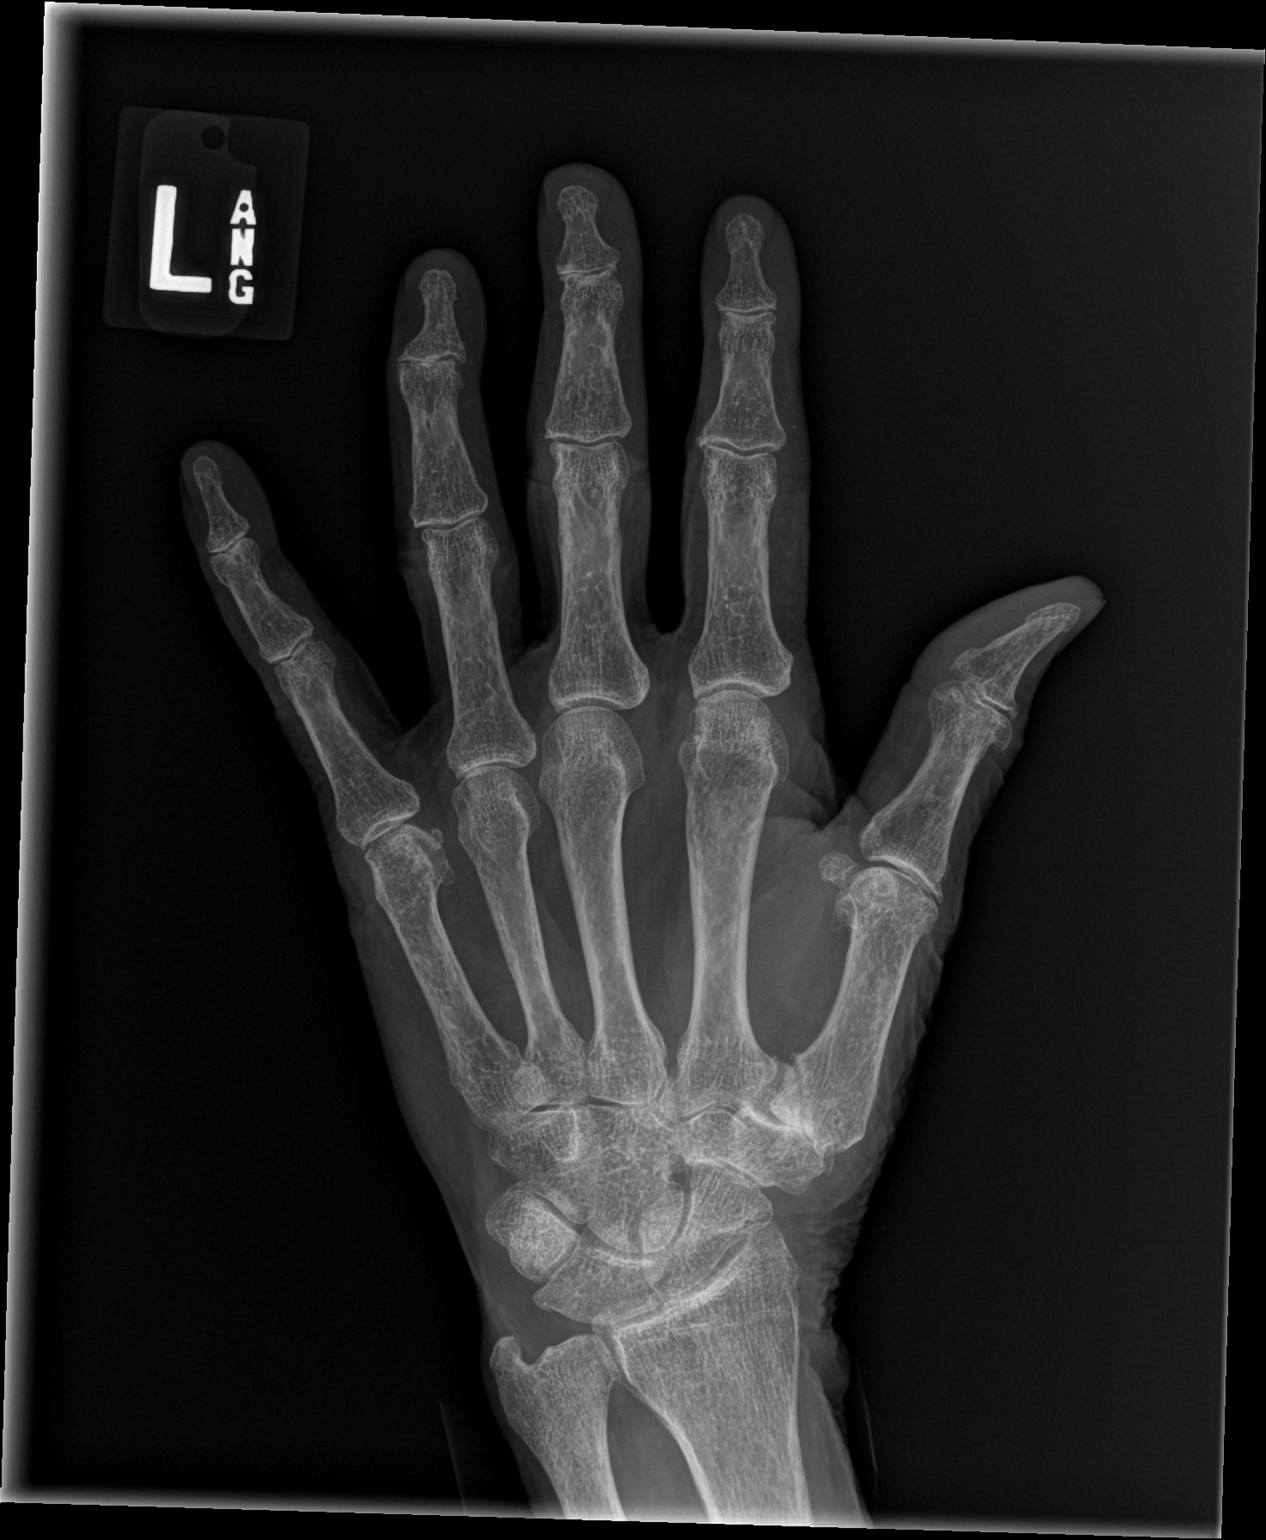

[x hand obl left]
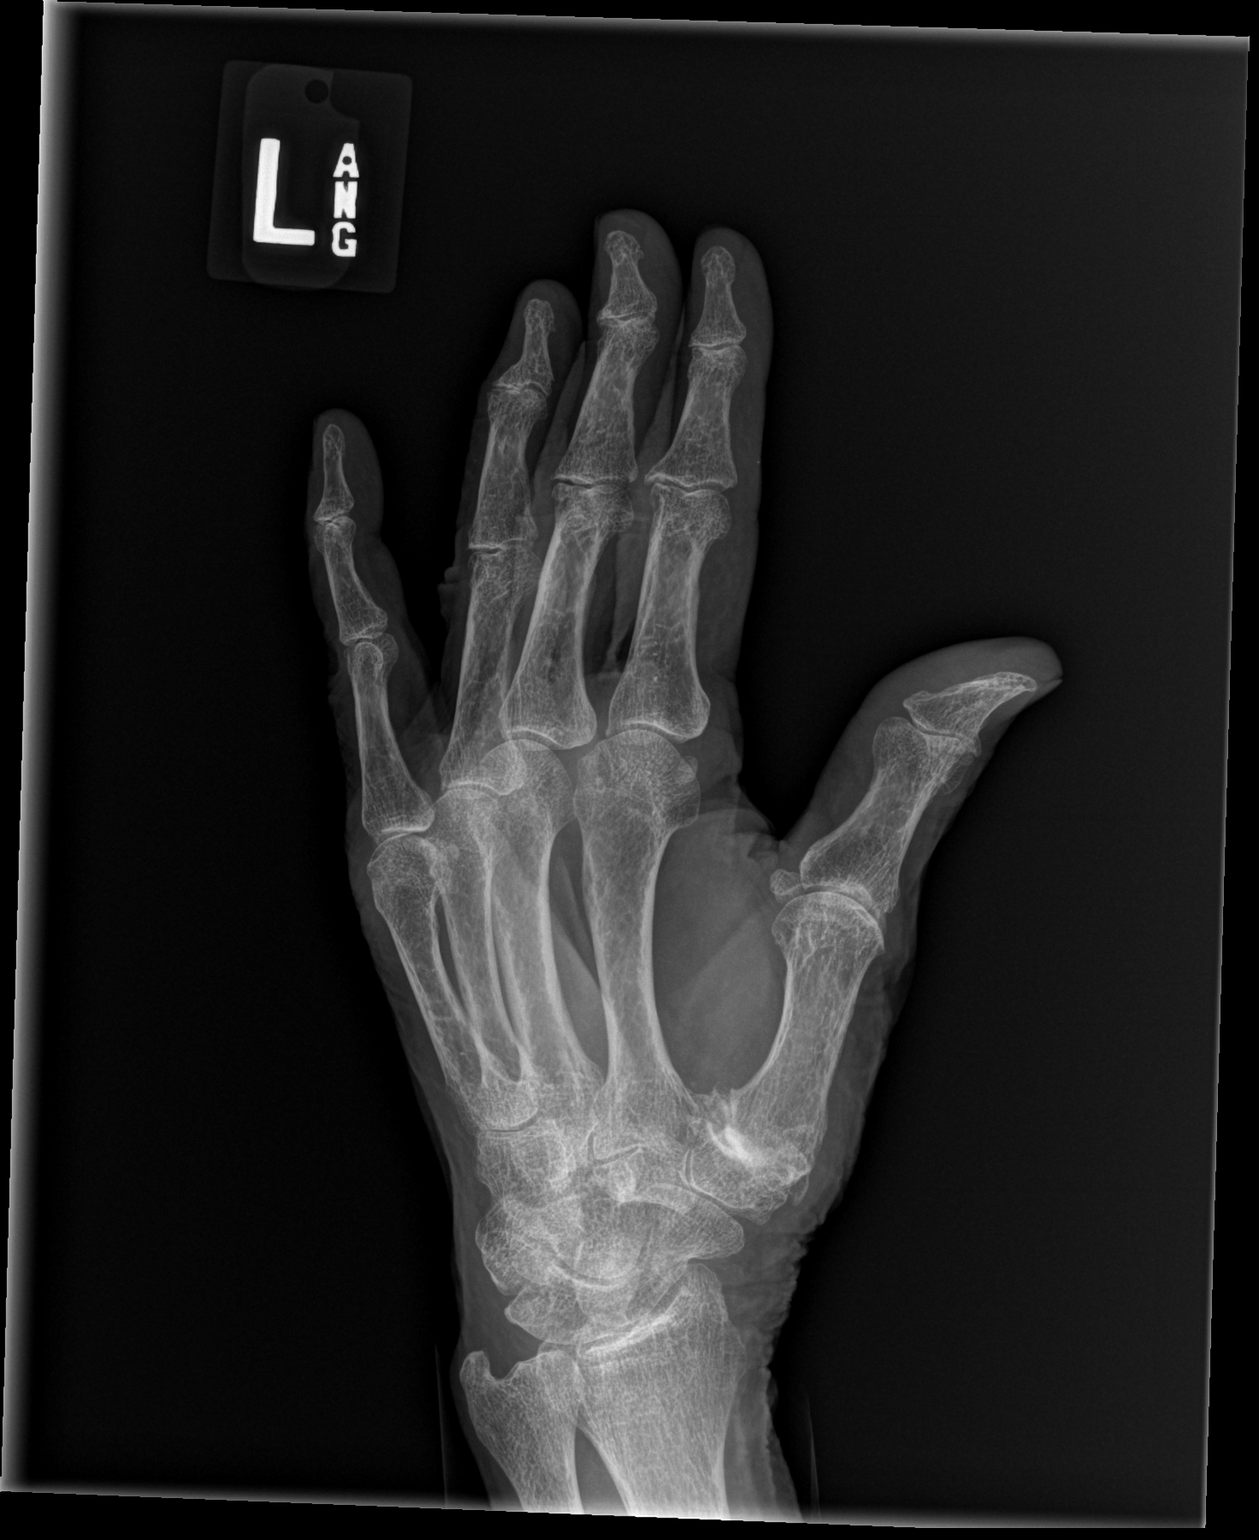

[x hand lat left]
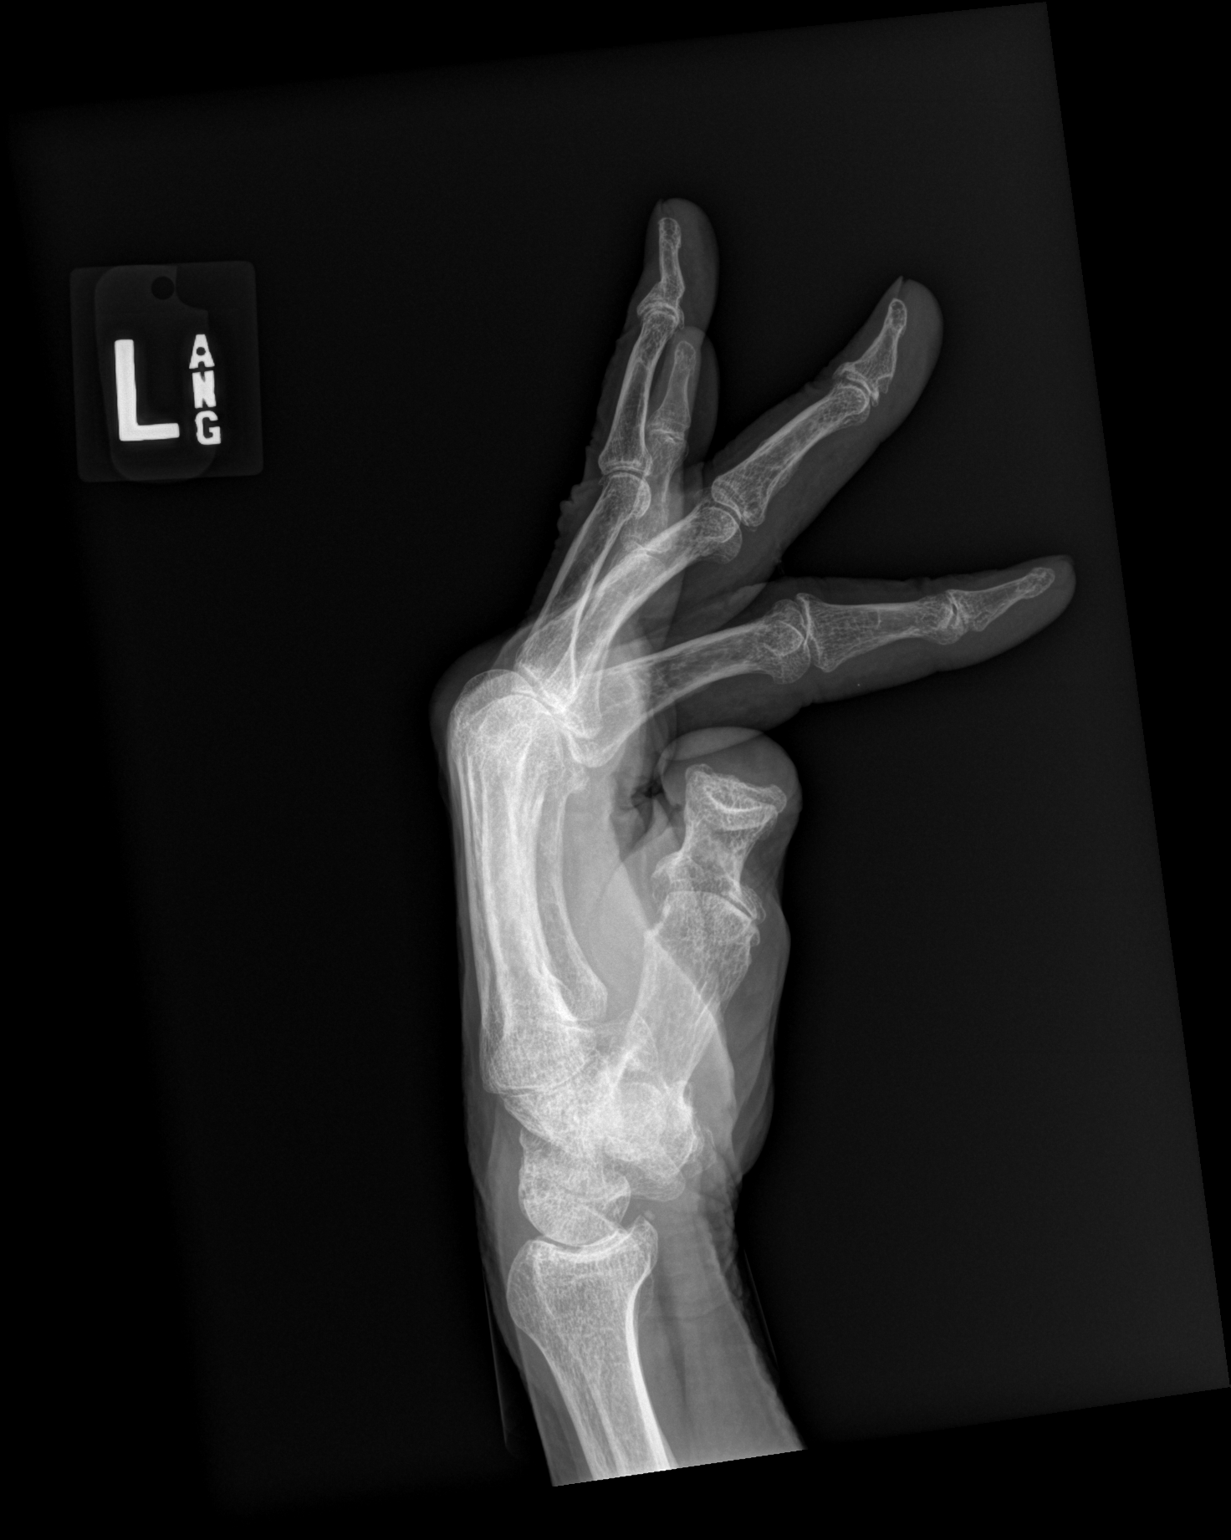

[3 of 3 positions shown; findings below may reference images not displayed]

FINDINGS: First CMC joint degenerative changes. Oblique lucency at the base of
the first metacarpal likely secondary to degenerative changes. The
IP joint degenerative changes. Normal anatomic alignment. No
evidence for acute fracture or dislocation. Regional soft tissues
are unremarkable.
IMPRESSION: There is an oblique lucency at the base of the first metacarpal
likely secondary to degenerative changes. Recommend correlation for
point tenderness to exclude the possibility of nondisplaced
fracture.

## 2023-03-05 IMAGING — CT CT HEAD W/O CM
3 series · 14 of 47 positions shown, 16 images · non-contrast
Comparison: Brain MRI 12/13/2005.

CLINICAL DATA: Fall.  Laceration to forehead and upper eyebrow.

EXAM:
CT HEAD WITHOUT CONTRAST
CT CERVICAL SPINE WITHOUT CONTRAST
TECHNIQUE: Multidetector CT imaging of the head and cervical spine was
performed following the standard protocol without intravenous
contrast. Multiplanar CT image reconstructions of the cervical spine
were also generated.

[Series 3: head wo · axial · 0.48mm/px · z∈[-133,-8]mm · 8 of 31 slices shown, 10 images]
[im 3/31  brain]
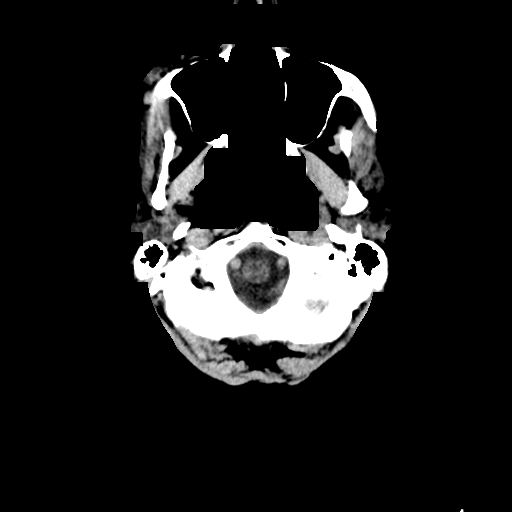
[im 3/31  bone]
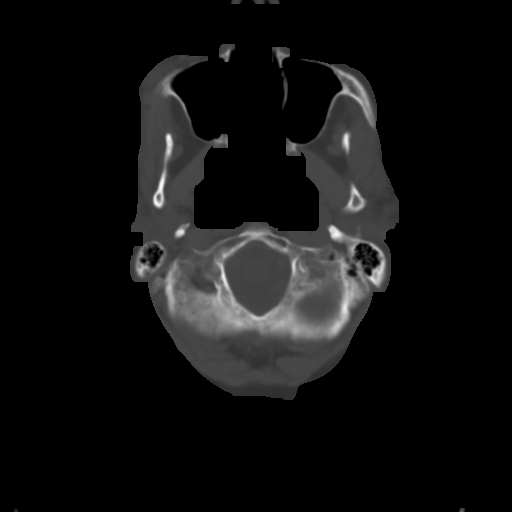
[im 7/31  brain]
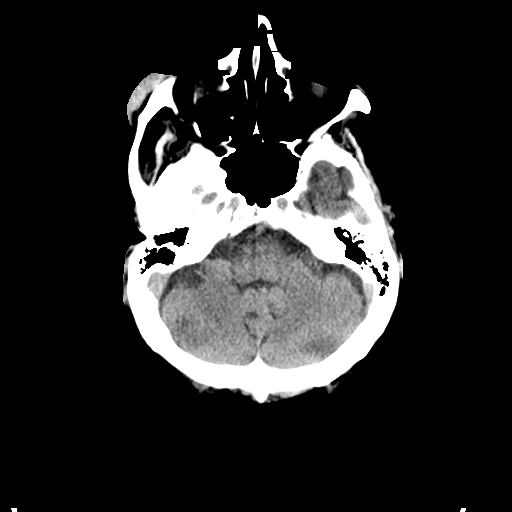
[im 10/31  brain]
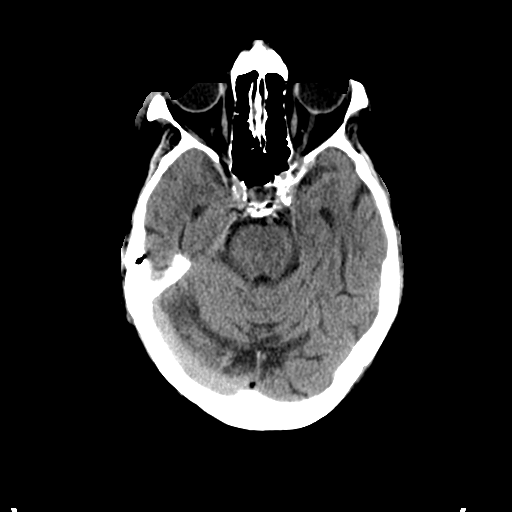
[im 14/31  brain]
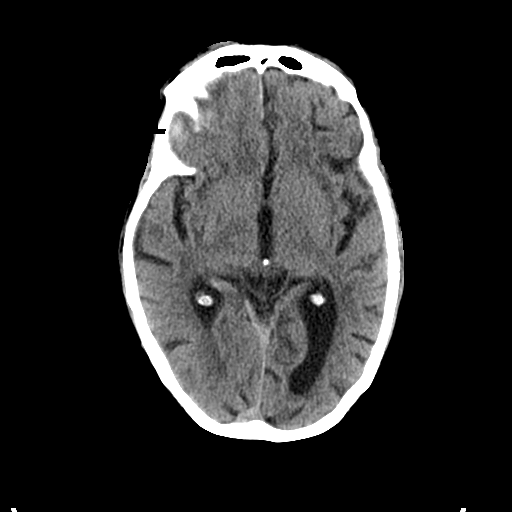
[im 17/31  brain]
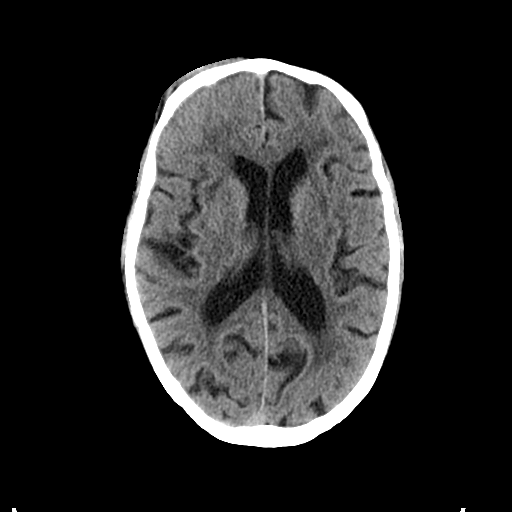
[im 17/31  bone]
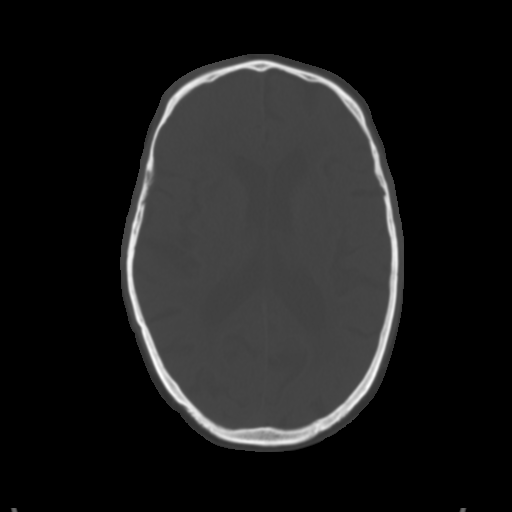
[im 21/31  brain]
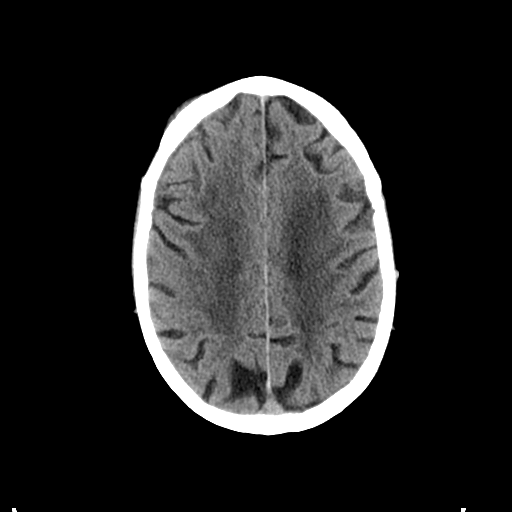
[im 24/31  brain]
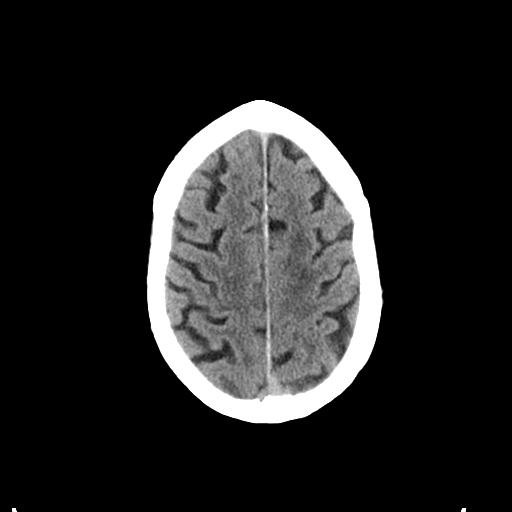
[im 28/31  brain]
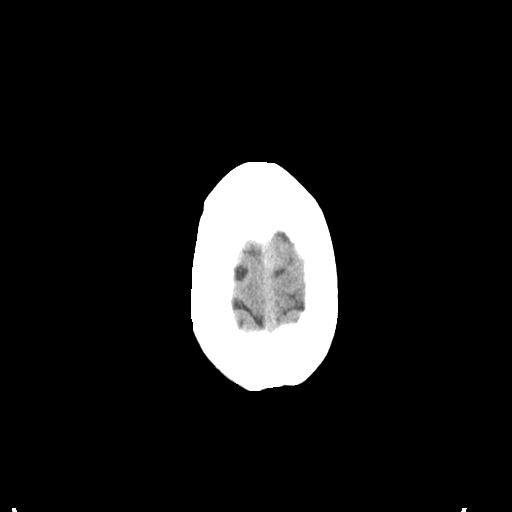

[Series 6: coronal soft tissue · coronal · 0.30mm/px · 3 of 73 slices shown]
[im 25/73  brain]
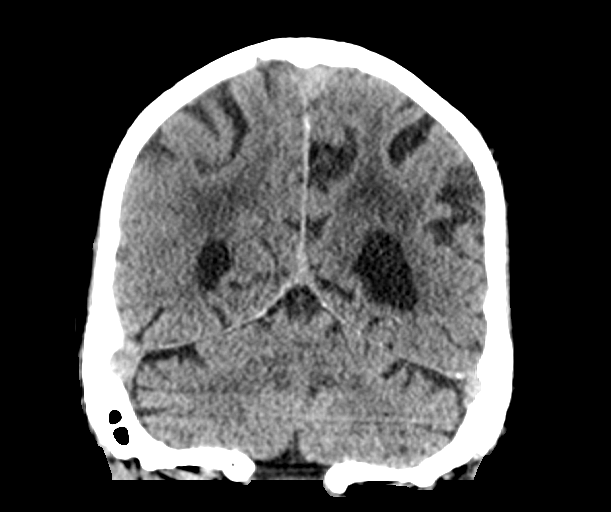
[im 33/73  brain]
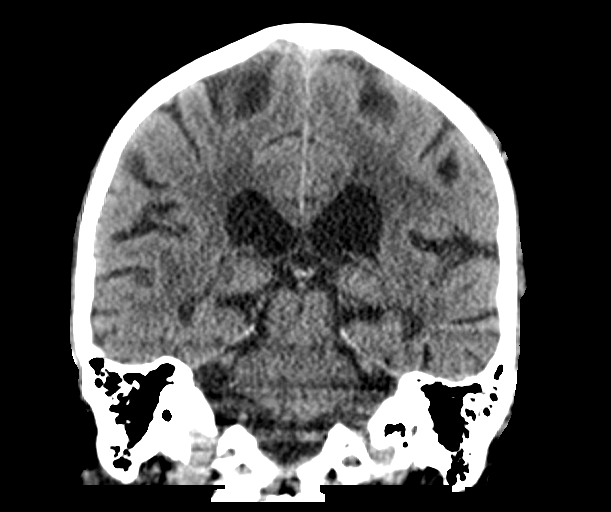
[im 41/73  brain]
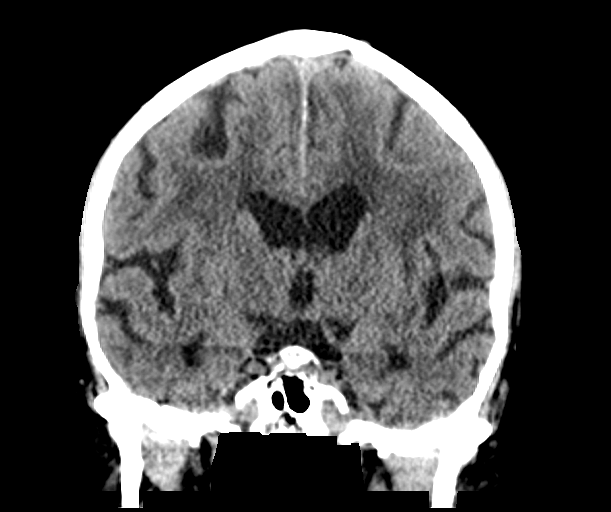

[Series 7: sagittal soft tissue · sagittal · 0.30mm/px · 3 of 62 slices shown]
[im 21/62  brain]
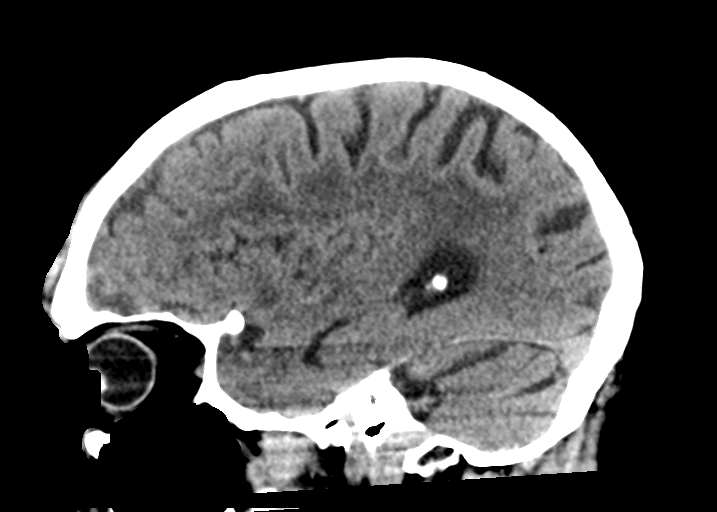
[im 31/62  brain]
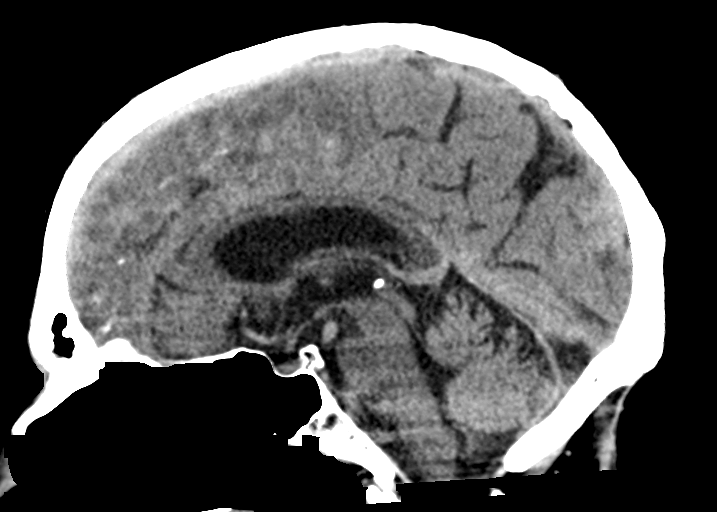
[im 41/62  brain]
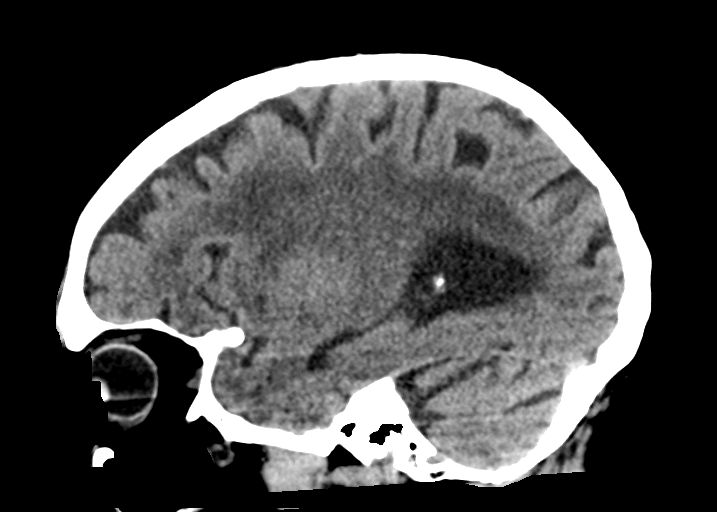

[14 of 47 positions shown; findings below may reference images not displayed]

FINDINGS: CT HEAD FINDING:

Brain:

Generalized cerebral and cerebellar atrophy.

Thin acute subdural hematoma overlying the anterior right frontal
lobe, measuring 3 mm in thickness. Additional small volume acute
subarachnoid hemorrhage overlying the anterior right frontal lobe.
Underlying right frontal lobe hemorrhagic parenchymal contusion
cannot be excluded.

Associated mass effect with 2 mm leftward midline shift measured at
the level of the septum pellucidum.

Advanced patchy and confluent hypoattenuation within the cerebral
white matter, nonspecific but compatible with chronic small vessel
ischemic disease.

No demarcated cortical infarct.

No extra-axial fluid collection.

No evidence of intracranial mass.

No midline shift.

Vascular: No hyperdense vessel.  Atherosclerotic calcifications.

Skull: Normal. Negative for fracture or focal lesion.

Sinuses/Orbits: Visualized orbits show no acute finding. No
significant paranasal sinus disease at the imaged levels.

Other: Right forehead, periorbital and maxillofacial hematoma.

CT CERVICAL SPINE FINDINGS

Alignment: Trace C4-C5 grade 1 anterolisthesis.

Skull base and vertebrae: The basion-dental and atlanto-dental
intervals are maintained.No evidence of acute fracture to the
cervical spine.

Soft tissues and spinal canal: No prevertebral fluid or swelling. No
visible canal hematoma.

Disc levels: Cervical spondylosis with multilevel disc space
narrowing, disc bulges, posterior disc osteophytes, uncovertebral
hypertrophy and facet arthrosis. Multilevel spinal canal stenosis.
Most notably, a C5-C6 posterior disc osteophyte complex contributes
to suspected mild/moderate spinal canal stenosis. Multilevel bony
neural foraminal narrowing.

Upper chest: No consolidation within the imaged lung apices. No
visible pneumothorax.

These results were called by telephone at the time of interpretation
on 11/22/2020 at [DATE] to provider FRANCISQUINHA RETT , who verbally
acknowledged these results.
IMPRESSION: CT head:

1. Thin acute subdural hematoma overlying the anterior right frontal
lobe.
2. Small volume acute subarachnoid hemorrhage overlying the anterior
right frontal lobe.
3. Underlying right frontal lobe hemorrhagic parenchymal contusion
is difficult to exclude.
4. Mass effect with 2 mm leftward midline shift.
5. Right forehead, periorbital and maxillofacial hematoma.
6. Generalized atrophy with advanced cerebral white matter chronic
small vessel ischemic disease.

CT cervical spine:

1. No evidence of acute fracture to the cervical spine.
2. Trace C4-C5 grade 1 anterolisthesis.
3. Cervical spondylosis, as described.

## 2023-03-13 ENCOUNTER — Other Ambulatory Visit: Payer: Self-pay | Admitting: Cardiology

## 2023-03-13 DIAGNOSIS — I10 Essential (primary) hypertension: Secondary | ICD-10-CM

## 2023-03-29 ENCOUNTER — Ambulatory Visit: Payer: Medicare Other | Admitting: Podiatry

## 2023-03-30 ENCOUNTER — Encounter: Payer: Self-pay | Admitting: Cardiology

## 2023-04-14 ENCOUNTER — Ambulatory Visit: Payer: Medicare Other | Admitting: Podiatry

## 2023-04-14 DIAGNOSIS — M79675 Pain in left toe(s): Secondary | ICD-10-CM

## 2023-04-14 DIAGNOSIS — B351 Tinea unguium: Secondary | ICD-10-CM | POA: Diagnosis not present

## 2023-04-14 DIAGNOSIS — M79674 Pain in right toe(s): Secondary | ICD-10-CM

## 2023-04-14 NOTE — Progress Notes (Signed)
  Subjective:  Patient ID: Peter Gallegos, male    DOB: 1933/03/09,  MRN: [id number]  Chief Complaint  Patient presents with   RFC    RFC- no concerns today just nail trim   89 y.o. male returns for the above complaint.  Patient presents with thickened elongated dystrophic onychomycosis bilaterally x10.  They are painful to palpation.  Patient states that they are very hard to and painful to ambulate on.  Patient would like to have the repair done.  He has no other acute concerns.  Objective:   There were no vitals filed for this visit.  Podiatric Exam: Vascular: dorsalis pedis and posterior tibial pulses are palpable bilateral. Capillary return is immediate. Temperature gradient is WNL. Skin turgor WNL  Sensorium: Normal Semmes Weinstein monofilament test. Normal tactile sensation bilaterally. Nail Exam: Pt has thick disfigured discolored nails with subungual debris noted bilateral entire nail hallux through fifth toenails.  Pain on palpation to the nails. Ulcer Exam: There is no evidence of ulcer or pre-ulcerative changes or infection. Orthopedic Exam: Muscle tone and strength are WNL. No limitations in general ROM. No crepitus or effusions noted. HAV  B/L.  Hammer toes 2-5  B/L. Skin: Hyperkeratotic lesion noted to bilateral medial aspect of the hallux.  No pinpoint bleeding noted.  Pain on palpation to the lesion. No infection or ulcers.  Dry skin without fissure or pain to the bilateral lower extremity    Assessment & Plan:   1. Pain due to onychomycosis of toenails of both feet         Patient was evaluated and treated and all questions answered.  Xerosis bilateral lower extremity -I explained to the patient the etiology of xerosis and various treatment options were extensively discussed.  I explained to the patient the importance of maintaining moisturization of the skin with application of over-the-counter lotion such as Eucerin or Luciderm.  I have asked the patient  to apply this twice a day.  If unable to resolve patient will benefit from prescription lotion.  Porokeratosis bilateral hallux x2 -I explained to patient the etiology of porokeratosis versus treatment options were discussed.  Given the amount of pain that is having I believe patient will benefit from aggressive debridement of the lesion.  Using chisel blade and handle the lesions were debrided down to healthy striated tissue.  No complication noted.  No pinpoint bleeding noted  Onychomycosis with pain  -Nails palliatively debrided as below. -Educated on self-care  Procedure: Nail Debridement Rationale: pain  Type of Debridement: manual, sharp debridement. Instrumentation: Nail nipper, rotary burr. Number of Nails: 10  Procedures and Treatment: Consent by patient was obtained for treatment procedures. The patient understood the discussion of treatment and procedures well. All questions were answered thoroughly reviewed. Debridement of mycotic and hypertrophic toenails, 1 through 5 bilateral and clearing of subungual debris. No ulceration, no infection noted.  Return Visit-Office Procedure: Patient instructed to return to the office for a follow up visit 3 months for continued evaluation and treatment.  Nicholes Rough, DPM    No follow-ups on file.

## 2023-04-25 DIAGNOSIS — R5381 Other malaise: Secondary | ICD-10-CM | POA: Diagnosis not present

## 2023-04-25 DIAGNOSIS — L03116 Cellulitis of left lower limb: Secondary | ICD-10-CM | POA: Diagnosis not present

## 2023-04-25 DIAGNOSIS — Z23 Encounter for immunization: Secondary | ICD-10-CM | POA: Diagnosis not present

## 2023-05-16 DIAGNOSIS — G4720 Circadian rhythm sleep disorder, unspecified type: Secondary | ICD-10-CM | POA: Diagnosis not present

## 2023-05-16 DIAGNOSIS — R600 Localized edema: Secondary | ICD-10-CM | POA: Diagnosis not present

## 2023-05-16 DIAGNOSIS — M6281 Muscle weakness (generalized): Secondary | ICD-10-CM | POA: Diagnosis not present

## 2023-05-24 DIAGNOSIS — I8312 Varicose veins of left lower extremity with inflammation: Secondary | ICD-10-CM | POA: Diagnosis not present

## 2023-05-24 DIAGNOSIS — I872 Venous insufficiency (chronic) (peripheral): Secondary | ICD-10-CM | POA: Diagnosis not present

## 2023-05-24 DIAGNOSIS — R600 Localized edema: Secondary | ICD-10-CM | POA: Diagnosis not present

## 2023-05-25 DIAGNOSIS — M6281 Muscle weakness (generalized): Secondary | ICD-10-CM | POA: Diagnosis not present

## 2023-05-30 DIAGNOSIS — M6281 Muscle weakness (generalized): Secondary | ICD-10-CM | POA: Diagnosis not present

## 2023-06-02 DIAGNOSIS — M6281 Muscle weakness (generalized): Secondary | ICD-10-CM | POA: Diagnosis not present

## 2023-06-06 DIAGNOSIS — M6281 Muscle weakness (generalized): Secondary | ICD-10-CM | POA: Diagnosis not present

## 2023-06-08 DIAGNOSIS — M6281 Muscle weakness (generalized): Secondary | ICD-10-CM | POA: Diagnosis not present

## 2023-06-13 DIAGNOSIS — M6281 Muscle weakness (generalized): Secondary | ICD-10-CM | POA: Diagnosis not present

## 2023-06-16 DIAGNOSIS — M6281 Muscle weakness (generalized): Secondary | ICD-10-CM | POA: Diagnosis not present

## 2023-06-20 DIAGNOSIS — M6281 Muscle weakness (generalized): Secondary | ICD-10-CM | POA: Diagnosis not present

## 2023-06-21 ENCOUNTER — Ambulatory Visit (HOSPITAL_COMMUNITY): Payer: Medicare Other | Attending: Cardiology

## 2023-06-21 DIAGNOSIS — I350 Nonrheumatic aortic (valve) stenosis: Secondary | ICD-10-CM | POA: Diagnosis not present

## 2023-06-21 LAB — ECHOCARDIOGRAM COMPLETE
AR max vel: 0.77 cm2
AV Area VTI: 0.72 cm2
AV Area mean vel: 0.72 cm2
AV Mean grad: 19 mm[Hg]
AV Peak grad: 35.3 mm[Hg]
Ao pk vel: 2.97 m/s
Area-P 1/2: 3.27 cm2
P 1/2 time: 380 ms
S' Lateral: 3.2 cm

## 2023-06-22 DIAGNOSIS — M6281 Muscle weakness (generalized): Secondary | ICD-10-CM | POA: Diagnosis not present

## 2023-06-23 DIAGNOSIS — I8312 Varicose veins of left lower extremity with inflammation: Secondary | ICD-10-CM | POA: Diagnosis not present

## 2023-06-23 DIAGNOSIS — R600 Localized edema: Secondary | ICD-10-CM | POA: Diagnosis not present

## 2023-06-23 DIAGNOSIS — I87393 Chronic venous hypertension (idiopathic) with other complications of bilateral lower extremity: Secondary | ICD-10-CM | POA: Diagnosis not present

## 2023-06-27 DIAGNOSIS — M6281 Muscle weakness (generalized): Secondary | ICD-10-CM | POA: Diagnosis not present

## 2023-06-29 DIAGNOSIS — M6281 Muscle weakness (generalized): Secondary | ICD-10-CM | POA: Diagnosis not present

## 2023-07-04 DIAGNOSIS — M6281 Muscle weakness (generalized): Secondary | ICD-10-CM | POA: Diagnosis not present

## 2023-07-06 ENCOUNTER — Encounter: Payer: Self-pay | Admitting: Cardiology

## 2023-07-06 ENCOUNTER — Ambulatory Visit: Payer: Medicare Other | Attending: Cardiology | Admitting: Cardiology

## 2023-07-06 VITALS — BP 120/78 | HR 74 | Resp 16 | Ht 66.0 in | Wt 151.0 lb

## 2023-07-06 DIAGNOSIS — Z8673 Personal history of transient ischemic attack (TIA), and cerebral infarction without residual deficits: Secondary | ICD-10-CM

## 2023-07-06 DIAGNOSIS — Z8679 Personal history of other diseases of the circulatory system: Secondary | ICD-10-CM | POA: Diagnosis not present

## 2023-07-06 DIAGNOSIS — R0609 Other forms of dyspnea: Secondary | ICD-10-CM | POA: Diagnosis not present

## 2023-07-06 DIAGNOSIS — I350 Nonrheumatic aortic (valve) stenosis: Secondary | ICD-10-CM | POA: Diagnosis not present

## 2023-07-06 DIAGNOSIS — I459 Conduction disorder, unspecified: Secondary | ICD-10-CM

## 2023-07-06 DIAGNOSIS — E782 Mixed hyperlipidemia: Secondary | ICD-10-CM | POA: Diagnosis not present

## 2023-07-06 DIAGNOSIS — I10 Essential (primary) hypertension: Secondary | ICD-10-CM | POA: Diagnosis not present

## 2023-07-06 NOTE — Progress Notes (Signed)
Cardiology Office Note:  .   Date:  07/06/2023  ID:  Peter Gallegos, DOB 26-Jan-1933, MRN [id number] PCP:  Merri Brunette, MD  Former Cardiology Providers: N/A [facility] Providers Cardiologist:  Tessa Lerner, DO , [facility] (established care 09/20/2021 ) Electrophysiologist:  None  Click to update primary MD,subspecialty MD or APP then REFRESH:1}    Chief Complaint  Patient presents with   Nonrheumatic aortic valve stenosis   Follow-up    History of Present Illness: .   Peter Gallegos is a 89 y.o. male whose past medical history and cardiovascular risk factors includes: Aortic stenosis, hypertension, history of TIA, family history of heart disease, history of alcohol abuse, hyperlipidemia, subarachnoid hematoma, PVCs, first-degree AV block, history of PACs, advanced age.   Patient is being followed by the practice given his aortic stenosis and underlying conduction disease.  Patient is accompanied by his wife at today's office visit.  The patient is noted to have paradoxical low-flow low gradient aortic stenosis and has been followed with serial echocardiograms as he is has not endorsed episodes of anginal chest pain, heart failure symptoms, or syncopal events.  In the past he did endorse lightheaded and dizziness but the symptoms shortly resolved after discontinuation of triamterene hydrochlorothiazide and transitioning him to losartan.  Since last office visit he has had a repeat echocardiogram which notes marginal reduction in LVEF from 60-65% to 55-60%, and severe paradoxical low-flow low gradient with a dimensional index of 0.21.  Clinically denies anginal chest pain, heart failure symptoms, or syncopal events.  However he is becoming more tired and fatigued and overall physical endurance is reducing.  The patient and wife would like to be considered for aortic valve replacement.  Patient has a history of prolonged first-degree AV block with Herbie Saxon physiology.  He  has had a Zio patch in the past which noted good heart rate variability and prior GXT in June 2023 illustrated fair functional capacity for age at 7 METS and achieved 84% of age-predicted maximum heart rate.  He has seen Dr. Ladona Ridgel in the past who recommended close monitoring and if he becomes symptomatic to consider evaluation for possible pacemaker implant.  Clinically, no syncopal events but has had falls when trying to ambulate backward patient states that he loses balance.  Encouraged him to ambulate with a cane.  Review of Systems: .   Review of Systems  Cardiovascular:  Negative for chest pain, claudication, irregular heartbeat, leg swelling, near-syncope, orthopnea, palpitations, paroxysmal nocturnal dyspnea and syncope.  Respiratory:  Negative for shortness of breath.   Hematologic/Lymphatic: Negative for bleeding problem.    Studies Reviewed:   EKG: 06/02/2022: Sinus rhythm, 67 bpm, second-degree type I Wenckebach physiology. December 02, 2022: Sinus rhythm with 66 bpm, prolonged first-degree AV block, diffuse T wave abnormality.  EKG Interpretation July 06 2023 08:48:38 EST Sinus rhythm prolong first degree AV block. Left axis deviation Minimal voltage criteria for LVH, may be normal variant ( R in aVL ) When compared with ECG of December 02 2022 no significant change.  Echocardiogram: June 21 2023 LVEF: 55 to 60% Diastolic Function: Grade 1 Left atrial size moderately dilated Regurgitation: Mild to moderate MR Stenosis: Severe paradoxical low-flow low gradient aortic stenosis (stroke-volume index 30, peak velocity 3 m/s, mean gradient 19 mmHg, AVA per VTI 0.72 cm2, dimensional index 0.21). Estimated RAP 3 mmHg See report for additional details  Cardiac monitor (Zio Patch): Patch Wear Time:  6 days and 23 hours  Dominant rhythm  sinus with first-degree AV block. Heart rate 30-102 bpm.  Avg HR 68 bpm. No atrial fibrillation, supraventricular tachycardia, ventricular  tachycardia, high grade AV block, pauses (3 seconds or longer). Minimum HR 30 bpm, asymptomatic, on 09/26/2021, at 11:20 PM, sinus with second-degree type I AV block. Total ventricular ectopic burden <1%. Total supraventricular ectopic burden 0%. Patient triggered events: 0.  RADIOLOGY: N/A  Risk Assessment/Calculations:   NA   Labs:       Latest Ref Rng & Units 11/23/2020    4:35 AM 11/22/2020    9:24 AM 07/05/2019   11:02 AM  CBC  WBC 4.0 - 10.5 K/uL 9.4  11.2  12.2   Hemoglobin 13.0 - 17.0 g/dL 16.1  09.6  04.5   Hematocrit 39.0 - 52.0 % 40.8  45.4  44.0   Platelets 150 - 400 K/uL 194  213  158       Latest Ref Rng & Units 12/09/2022    8:28 AM 11/24/2020    2:54 AM 11/23/2020    4:35 AM  CMP  Glucose 70 - 99 mg/dL 81  409  811   BUN 8 - 27 mg/dL 21  17  16    Creatinine 0.76 - 1.27 mg/dL 9.14  7.82  9.56   Sodium 134 - 144 mmol/L 144  135  138   Potassium 3.5 - 5.2 mmol/L 4.1  3.4  3.3   Chloride 96 - 106 mmol/L 105  102  99   CO2 20 - 29 mmol/L 27  26  28    Calcium 8.6 - 10.2 mg/dL 9.5  8.4  8.8   Total Protein 6.5 - 8.1 g/dL   5.6   Total Bilirubin 0.3 - 1.2 mg/dL   1.1   Alkaline Phos 38 - 126 U/L   49   AST 15 - 41 U/L   23   ALT 0 - 44 U/L   23     No results found for: "CHOL", "HDL", "LDLCALC", "LDLDIRECT", "TRIG", "CHOLHDL" No results for input(s): "LIPOA" in the last 8760 hours. No components found for: "NTPROBNP" No results for input(s): "PROBNP" in the last 8760 hours. No results for input(s): "TSH" in the last 8760 hours.   Physical Exam:    Today's Vitals   07/06/23 0844  BP: 120/78  Pulse: 74  Resp: 16  SpO2: 95%  Weight: 151 lb (68.5 kg)  Height: 5\' 6"  (1.676 m)   Body mass index is 24.37 kg/m. Wt Readings from Last 3 Encounters:  07/06/23 151 lb (68.5 kg)  02/17/23 148 lb (67.1 kg)  01/13/23 146 lb 9.6 oz (66.5 kg)    Physical Exam  Constitutional: No distress.  Age appropriate, hemodynamically stable.   Neck: No JVD present.   Cardiovascular: Normal rate, regular rhythm, S1 normal, S2 normal, intact distal pulses and normal pulses. Exam reveals no gallop, no S3 and no S4.  Murmur heard. Midsystolic murmur is present with a grade of 3/6 at the upper right sternal border. Pulmonary/Chest: Effort normal and breath sounds normal. No stridor. He has no wheezes. He has no rales.  Abdominal: Soft. Bowel sounds are normal. He exhibits no distension. There is no abdominal tenderness.  Musculoskeletal:        General: No edema.     Cervical back: Neck supple.  Neurological: He is alert and oriented to person, place, and time. He has intact cranial nerves (2-12).  Skin: Skin is warm and moist.     Impression & Recommendation(s):  Impression:   ICD-10-CM   1. Nonrheumatic aortic valve stenosis  I35.0 EKG 12-Lead    2. Dyspnea on exertion  R06.09     3. Conduction disorder of the heart  I45.9     4. Benign hypertension  I10     5. Mixed hyperlipidemia  E78.2     6. Hx of transient ischemic attack (TIA)  Z86.73     7. Hx of subarachnoid hemorrhage  Z86.79        Recommendation(s):  Nonrheumatic aortic valve stenosis Dyspnea on exertion In the past he was noted to have paradoxical low-flow low gradient severe aortic stenosis but this shared decision initially was to monitor him clinically.  He presents today for 41-month follow-up and had an echocardiogram earlier this month.  His LVEF is moderately reduced from 60 to 65% to 55-60%.  Hemodynamics are still consistent with low-flow low gradient aortic stenosis with a dimensional index of 0.21.  Clinically he does not have the conventional symptoms of anginal chest pain, syncope, or heart failure exacerbation.  However, he does endorse feeling more tired, fatigued, and reduced physical endurance.  Patient and wife state that they have been thinking about his underlying valvular heart disease and want to be considered for transcatheter aortic valve  replacement.  Given his advanced age and underlying conduction disease I will refer him to structural heart team to be evaluated for TAVR candidacy prior to undergoing left & right heart catheterization, CTA per TAVR protocol, etc.  Will consult Dr. Tonny Bollman.  Patient and his wife do understand that he is at high risk of progression of conduction disease and the need for permanent pacemaker if he undergoes aortic valve replacement.   Conduction disorder of the heart & marked first-degree AV block & history of Wenckebach physiology He did undergo Zio patch in the past which noted good heart rate variability. GXT back in June 2023 -he achieved 84% of age-predicted maximum heart rate and 7 METS. He has been evaluated by Dr. Ladona Ridgel in the past who recommended close monitoring and if becomes symptomatic could consider pacemaker implant. Continue to monitor for now.  Benign hypertension Office blood pressures are well-controlled. Continue losartan 25 mg p.o. daily  Mixed hyperlipidemia Continue Lipitor 40 mg p.o. nightly  Orders Placed:  Orders Placed This Encounter  Procedures   EKG 12-Lead    Final Medication List:   No orders of the defined types were placed in this encounter.   There are no discontinued medications.   Current Outpatient Medications:    alendronate (FOSAMAX) 70 MG tablet, Take 70 mg by mouth once a week., Disp: , Rfl:    atorvastatin (LIPITOR) 40 MG tablet, Take 40 mg by mouth daily., Disp: , Rfl:    buPROPion (WELLBUTRIN XL) 300 MG 24 hr tablet, Take 300 mg by mouth daily., Disp: , Rfl:    CALCIUM PO, Take 1 tablet by mouth daily., Disp: , Rfl:    Cholecalciferol (VITAMIN D) 125 MCG (5000 UT) CAPS, Take 5,000 Units by mouth daily., Disp: , Rfl:    dipyridamole-aspirin (AGGRENOX) 200-25 MG 12hr capsule, Take 1 capsule by mouth 2 (two) times daily., Disp: , Rfl:    losartan (COZAAR) 25 MG tablet, TAKE 1 TABLET BY MOUTH ONCE DAILY IN THE MORNING, Disp: 30  tablet, Rfl: 5   Multiple Vitamin (MULTIVITAMIN WITH MINERALS) TABS tablet, Take 1 tablet by mouth daily., Disp: , Rfl:    sertraline (ZOLOFT) 50 MG tablet, Take 50 mg by mouth  daily., Disp: , Rfl:   Consent:   NA  Disposition:   47-month follow-up visit.  His questions and concerns were addressed to his satisfaction. He voices understanding of the recommendations provided during this encounter.    Signed, Tessa Lerner, DO, [facility]   [facility]  [address] [place], [place] [address] 07/06/2023 9:06 AM

## 2023-07-06 NOTE — Patient Instructions (Signed)
Follow-Up: At [facility], you and your health needs are our priority.  As part of our continuing mission to provide you with exceptional heart care, we have created designated Provider Care Teams.  These Care Teams include your primary Cardiologist (physician) and Advanced Practice Providers (APPs -  Physician Assistants and Nurse Practitioners) who all work together to provide you with the care you need, when you need it.  Your next appointment:   6 month(s)  Provider:   Tessa Lerner, DO     Other Instructions  Appointemnt made to see Dr. Excell Seltzer for [facility] to determine if a candidate.     1st Floor: - Lobby - Registration  - Pharmacy  - Lab - Cafe  2nd Floor: - PV Lab - Diagnostic Testing (echo, CT, nuclear med)  3rd Floor: - Vacant  4th Floor: - TCTS (cardiothoracic surgery) - AFib Clinic - Structural Heart Clinic - Vascular Surgery  - Vascular Ultrasound  5th Floor: - HeartCare Cardiology (general and EP) - Clinical Pharmacy for coumadin, hypertension, lipid, weight-loss medications, and med management appointments    Valet parking services will be available as well.

## 2023-07-07 DIAGNOSIS — M6281 Muscle weakness (generalized): Secondary | ICD-10-CM | POA: Diagnosis not present

## 2023-07-10 ENCOUNTER — Ambulatory Visit: Payer: Medicare Other | Admitting: Cardiovascular Disease

## 2023-07-11 DIAGNOSIS — M6281 Muscle weakness (generalized): Secondary | ICD-10-CM | POA: Diagnosis not present

## 2023-07-13 ENCOUNTER — Ambulatory Visit: Payer: Medicare Other | Attending: Cardiovascular Disease | Admitting: Cardiovascular Disease

## 2023-07-13 ENCOUNTER — Encounter: Payer: Self-pay | Admitting: Cardiovascular Disease

## 2023-07-13 VITALS — BP 142/88 | HR 64 | Ht 66.0 in | Wt 153.2 lb

## 2023-07-13 DIAGNOSIS — I350 Nonrheumatic aortic (valve) stenosis: Secondary | ICD-10-CM | POA: Diagnosis not present

## 2023-07-13 NOTE — Patient Instructions (Signed)
 Testing/Procedures: ECHO (to be done in 3 months, prior to visit with Michele) Your physician has requested that you have an echocardiogram. Echocardiography is a painless test that uses sound waves to create images of your heart. It provides your doctor with information about the size and shape of your heart and how well your heart's chambers and valves are working. This procedure takes approximately one hour. There are no restrictions for this procedure. Please do NOT wear cologne, perfume, aftershave, or lotions (deodorant is allowed). Please arrive 15 minutes prior to your appointment time.  Please note: We ask at that you not bring children with you during ultrasound (echo/ vascular) testing. Due to room size and safety concerns, children are not allowed in the ultrasound rooms during exams. Our front office staff cannot provide observation of children in our lobby area while testing is being conducted. An adult accompanying a patient to their appointment will only be allowed in the ultrasound room at the discretion of the ultrasound technician under special circumstances. We apologize for any inconvenience.  Follow-Up: At [facility], you and your health needs are our priority.  As part of our continuing mission to provide you with exceptional heart care, we have created designated Provider Care Teams.  These Care Teams include your primary Cardiologist (physician) and Advanced Practice Providers (APPs -  Physician Assistants and Nurse Practitioners) who all work together to provide you with the care you need, when you need it.  Your next appointment:   6 month(s)  Provider:   Madonna Michele, DO     1st Floor: - Lobby - Registration  - Pharmacy  - Lab - Cafe  2nd Floor: - PV Lab - Diagnostic Testing (echo, CT, nuclear med)  3rd Floor: - Vacant  4th Floor: - TCTS (cardiothoracic surgery) - AFib Clinic - Structural Heart Clinic - Vascular Surgery  - Vascular  Ultrasound  5th Floor: - HeartCare Cardiology (general and EP) - Clinical Pharmacy for coumadin, hypertension, lipid, weight-loss medications, and med management appointments    Valet parking services will be available as well.

## 2023-07-13 NOTE — Progress Notes (Signed)
 Cardiology Office Note:    Date:  07/13/2023   ID:  Peter Gallegos, DOB 04-27-1933, MRN [id number]  PCP:  Peter Nottingham, MD   [facility] Providers Cardiologist:  Peter Large, DO     Referring MD: Peter Nottingham, MD   Chief Complaint  Patient presents with   Aortic Stenosis    History of Present Illness:    Peter Gallegos is a 89 y.o. male referred by Dr. Large for evaluation of aortic stenosis.  The patient is here with his wife today.  They live independently, locally in [place].  He has been followed for aortic stenosis for several years.  He has a history of mechanical fall with subsequent subarachnoid hematoma, and conduction disease with prior EP evaluation recommending continued observation.  His symptoms include lightheadedness with postural changes, exertional dyspnea with a moderate level physical activity, and generalized fatigue.  He does not get out much anymore, but remains modestly active around his home.  He denies orthopnea, PND, chest pain, or syncope.  He has recent echo demonstrated progression of his aortic valve disease, now with severe calcification and restriction of his aortic valve leaflets on 2D imaging, LVEF 55 to 60%, mean transvalvular gradient of 19 mmHg, peak transaortic velocity of 3 m/s, calculated aortic valve area of 0.7 cm, low stroke-volume index of 30, and aortic valve dimensionless index of 0.21.  Findings are felt to be consistent with severe paradoxical low-flow low gradient aortic stenosis.  Patient is referred for further evaluation.     Current Medications: Current Meds  Medication Sig   alendronate (FOSAMAX) 70 MG tablet Take 70 mg by mouth once a week.   atorvastatin  (LIPITOR) 40 MG tablet Take 40 mg by mouth daily.   buPROPion  (WELLBUTRIN  XL) 300 MG 24 hr tablet Take 300 mg by mouth daily.   CALCIUM  PO Take 1 tablet by mouth daily.   Cholecalciferol (VITAMIN D) 125 MCG (5000 UT) CAPS Take 5,000 Units by mouth  daily.   dipyridamole -aspirin  (AGGRENOX ) 200-25 MG 12hr capsule Take 1 capsule by mouth 2 (two) times daily.   losartan  (COZAAR ) 25 MG tablet TAKE 1 TABLET BY MOUTH ONCE DAILY IN THE MORNING   Multiple Vitamin (MULTIVITAMIN WITH MINERALS) TABS tablet Take 1 tablet by mouth daily.   sertraline  (ZOLOFT ) 50 MG tablet Take 50 mg by mouth daily.     Allergies:   Patient has no known allergies.   ROS:   Please see the history of present illness.    All other systems reviewed and are negative.  EKGs/Labs/Other Studies Reviewed:    The following studies were reviewed today: Cardiac Studies & Procedures      ECHOCARDIOGRAM  ECHOCARDIOGRAM COMPLETE 06/21/2023  Narrative ECHOCARDIOGRAM REPORT    Patient Name:   Peter Gallegos Date of Exam: 06/21/2023 Medical Rec #:  [id number]           Height:       66.0 in Accession #:    [id number]          Weight:       148.0 lb Date of Birth:  1932/06/13           BSA:          1.760 m Patient Age:    89 years            BP:           155/90 mmHg Patient Gender: M  HR:           60 bpm. Exam Location:  [address]  Procedure: 2D Echo, Cardiac Doppler, Color Doppler and 3D Echo  Indications:    Nonrheumatic aortic valve stenosis I35.0  History:        Patient has prior history of Echocardiogram examinations, most recent 12/19/2022. Risk Factors:Hypertension.  Sonographer:    Augustin Seals RDCS Referring Phys: [id number] SUNIT TOLIA  IMPRESSIONS   1. DI - 0.22 severe Low flow low gradient severe aortic stenosis. The aortic valve is calcified. There is severe calcifcation of the aortic valve. Aortic valve regurgitation is mild. Severe aortic valve stenosis. Aortic valve area, by VTI measures 0.72 cm. Aortic valve mean gradient measures 19.0 mmHg. Aortic valve Vmax measures 2.97 m/s. 2. Left ventricular ejection fraction, by estimation, is 55 to 60%. Left ventricular ejection fraction by 3D volume is 57 %. The left  ventricle has normal function. The left ventricle has no regional wall motion abnormalities. Left ventricular diastolic parameters are consistent with Grade I diastolic dysfunction (impaired relaxation). 3. Right ventricular systolic function is normal. The right ventricular size is normal. There is normal pulmonary artery systolic pressure. 4. Left atrial size was moderately dilated. 5. The mitral valve is normal in structure. Mild to moderate mitral valve regurgitation. No evidence of mitral stenosis. 6. The inferior vena cava is normal in size with greater than 50% respiratory variability, suggesting right atrial pressure of 3 mmHg.  FINDINGS Left Ventricle: Left ventricular ejection fraction, by estimation, is 55 to 60%. Left ventricular ejection fraction by 3D volume is 57 %. The left ventricle has normal function. The left ventricle has no regional wall motion abnormalities. The left ventricular internal cavity size was normal in size. There is no left ventricular hypertrophy. Left ventricular diastolic parameters are consistent with Grade I diastolic dysfunction (impaired relaxation).  Right Ventricle: The right ventricular size is normal. No increase in right ventricular wall thickness. Right ventricular systolic function is normal. There is normal pulmonary artery systolic pressure. The tricuspid regurgitant velocity is 2.67 m/s, and with an assumed right atrial pressure of 3 mmHg, the estimated right ventricular systolic pressure is 31.5 mmHg.  Left Atrium: Left atrial size was moderately dilated.  Right Atrium: Right atrial size was normal in size.  Pericardium: There is no evidence of pericardial effusion.  Mitral Valve: The mitral valve is normal in structure. Mild to moderate mitral valve regurgitation. No evidence of mitral valve stenosis.  Tricuspid Valve: The tricuspid valve is normal in structure. Tricuspid valve regurgitation is mild . No evidence of tricuspid  stenosis.  Aortic Valve: DI - 0.22 severe Low flow low gradient severe aortic stenosis. The aortic valve is calcified. There is severe calcifcation of the aortic valve. Aortic valve regurgitation is mild. Aortic regurgitation PHT measures 380 msec. Severe aortic stenosis is present. Aortic valve mean gradient measures 19.0 mmHg. Aortic valve peak gradient measures 35.3 mmHg. Aortic valve area, by VTI measures 0.72 cm.  Pulmonic Valve: The pulmonic valve was normal in structure. Pulmonic valve regurgitation is not visualized. No evidence of pulmonic stenosis.  Aorta: The aortic root is normal in size and structure.  Venous: The inferior vena cava is normal in size with greater than 50% respiratory variability, suggesting right atrial pressure of 3 mmHg.  IAS/Shunts: No atrial level shunt detected by color flow Doppler.   LEFT VENTRICLE PLAX 2D LVIDd:         4.60 cm  Diastology LVIDs:         3.20 cm         LV e' medial:   8.35 cm/s LV PW:         0.90 cm         LV E/e' medial: 9.0 LV IVS:        1.10 cm LVOT diam:     2.10 cm LV SV:         52              3D Volume EF LV SV Index:   30              LV 3D EF:    Left LVOT Area:     3.46 cm                     ventricul ar ejection fraction by 3D volume is 57 %.  3D Volume EF: 3D EF:        57 % LV EDV:       139 ml LV ESV:       60 ml LV SV:        79 ml  RIGHT VENTRICLE            IVC RV Basal diam:  2.80 cm    IVC diam: 2.00 cm RV S prime:     7.76 cm/s TAPSE (M-mode): 1.4 cm  LEFT ATRIUM             Index        RIGHT ATRIUM           Index LA diam:        4.90 cm 2.78 cm/m   RA Area:     11.40 cm LA Vol (A2C):   51.2 ml 29.10 ml/m  RA Volume:   20.80 ml  11.82 ml/m LA Vol (A4C):   37.6 ml 21.37 ml/m LA Biplane Vol: 45.2 ml 25.69 ml/m AORTIC VALVE AV Area (Vmax):    0.77 cm AV Area (Vmean):   0.72 cm AV Area (VTI):     0.72 cm AV Vmax:           297.00 cm/s AV Vmean:          199.000  cm/s AV VTI:            0.728 m AV Peak Grad:      35.3 mmHg AV Mean Grad:      19.0 mmHg LVOT Vmax:         66.00 cm/s LVOT Vmean:        41.400 cm/s LVOT VTI:          0.151 m LVOT/AV VTI ratio: 0.21 AI PHT:            380 msec  AORTA Ao Root diam: 3.10 cm Ao Asc diam:  3.60 cm  MITRAL VALVE               TRICUSPID VALVE MV Area (PHT): 3.27 cm    TR Peak grad:   28.5 mmHg MV Decel Time: 232 msec    TR Vmax:        267.00 cm/s MV E velocity: 74.90 cm/s MV A velocity: 87.00 cm/s  SHUNTS MV E/A ratio:  0.86        Systemic VTI:  0.15 m Systemic Diam: 2.10 cm  Oneil Parchment MD Electronically signed by Oneil Parchment MD Signature Date/Time: 06/21/2023/2:40:49 PM  Final   MONITORS  LONG TERM MONITOR (3-14 DAYS) 10/04/2021  Narrative Cardiac monitor (Zio Patch): Patch Wear Time:  6 days and 23 hours Dominant rhythm sinus with first-degree AV block. Heart rate 30-102 bpm.  Avg HR 68 bpm. No atrial fibrillation, supraventricular tachycardia, ventricular tachycardia, high grade AV block, pauses (3 seconds or longer). Minimum HR 30 bpm, asymptomatic, on 09/26/2021, at 11:20 PM, sinus with second-degree type I AV block. Total ventricular ectopic burden <1%. Total supraventricular ectopic burden 0%. Patient triggered events: 0.           EKG:        Recent Labs: 12/09/2022: BUN 21; Creatinine, Ser 1.24; Potassium 4.1; Sodium 144  Recent Lipid Panel No results found for: CHOL, TRIG, HDL, CHOLHDL, VLDL, LDLCALC, LDLDIRECT          Physical Exam:    VS:  BP (!) 142/88   Pulse 64   Ht 5' 6 (1.676 m)   Wt 153 lb 3.2 oz (69.5 kg)   SpO2 97%   BMI 24.73 kg/m     Wt Readings from Last 3 Encounters:  07/13/23 153 lb 3.2 oz (69.5 kg)  07/06/23 151 lb (68.5 kg)  02/17/23 148 lb (67.1 kg)     GEN:  Well nourished, well developed elderly male in no acute distress HEENT: Normal NECK: No JVD; No carotid bruits LYMPHATICS: No lymphadenopathy CARDIAC: RRR,  3/6 mid peaking crescendo decrescendo murmur heard best at the left lower sternal border and apex, diminished A2 RESPIRATORY:  Clear to auscultation without rales, wheezing or rhonchi  ABDOMEN: Soft, non-tender, non-distended MUSCULOSKELETAL:  No edema; No deformity  SKIN: Warm and dry NEUROLOGIC:  Alert and oriented x 3 PSYCHIATRIC:  Normal affect   Assessment & Plan Nonrheumatic aortic valve stenosis The patient has severe, stage D3 (paradoxical low-flow low gradient) aortic stenosis.  This is associated with NYHA functional class III symptoms of exertional dyspnea and fatigue. I have reviewed the natural history of aortic stenosis with the patient and their family members who are present today. We have discussed the limitations of medical therapy and the poor prognosis associated with symptomatic aortic stenosis. We have reviewed potential treatment options, including palliative medical therapy, conventional surgical aortic valve replacement, and transcatheter aortic valve replacement. We discussed treatment options in the context of the patient's specific comorbid medical conditions.  The patient understands that if he decides to pursue TAVR, he will require further evaluation to include coronary angiography, a gated CTA of the heart, and a CTA of the chest, abdomen, and pelvis.  I discussed these procedures and imaging test with him today.  I demonstrated a procedural animation of the TAVR procedure and explained each step of the procedure with them.  They understand that risks of serious and potentially life-threatening complication occur in less than 1 to 2% of cases.  I think his risk of a permanent pacemaker could be as much is 10% with his very prolonged first-degree AV block and history of [employer].  He otherwise does not appear to have any high risk features but we would need to further assess his anatomic suitability for TAVR with CTA studies.  The patient would like to think things over  before scheduling his preprocedural studies and he would like to discuss further with his wife.  He is scheduled to see Dr. Michele back in follow-up in about 3 months.  I would be happy to discuss further with him if he decides to move forward.  Medication Adjustments/Labs and Tests Ordered: Current medicines are reviewed at length with the patient today.  Concerns regarding medicines are outlined above.  Orders Placed This Encounter  Procedures   ECHOCARDIOGRAM COMPLETE   No orders of the defined types were placed in this encounter.   Patient Instructions  Testing/Procedures: ECHO (to be done in 3 months, prior to visit with Michele) Your physician has requested that you have an echocardiogram. Echocardiography is a painless test that uses sound waves to create images of your heart. It provides your doctor with information about the size and shape of your heart and how well your heart's chambers and valves are working. This procedure takes approximately one hour. There are no restrictions for this procedure. Please do NOT wear cologne, perfume, aftershave, or lotions (deodorant is allowed). Please arrive 15 minutes prior to your appointment time.  Please note: We ask at that you not bring children with you during ultrasound (echo/ vascular) testing. Due to room size and safety concerns, children are not allowed in the ultrasound rooms during exams. Our front office staff cannot provide observation of children in our lobby area while testing is being conducted. An adult accompanying a patient to their appointment will only be allowed in the ultrasound room at the discretion of the ultrasound technician under special circumstances. We apologize for any inconvenience.  Follow-Up: At [facility], you and your health needs are our priority.  As part of our continuing mission to provide you with exceptional heart care, we have created designated Provider Care Teams.  These Care Teams  include your primary Cardiologist (physician) and Advanced Practice Providers (APPs -  Physician Assistants and Nurse Practitioners) who all work together to provide you with the care you need, when you need it.  Your next appointment:   6 month(s)  Provider:   Madonna Michele, DO     1st Floor: - Lobby - Registration  - Pharmacy  - Lab - Cafe  2nd Floor: - PV Lab - Diagnostic Testing (echo, CT, nuclear med)  3rd Floor: - Vacant  4th Floor: - TCTS (cardiothoracic surgery) - AFib Clinic - Structural Heart Clinic - Vascular Surgery  - Vascular Ultrasound  5th Floor: - HeartCare Cardiology (general and EP) - Clinical Pharmacy for coumadin, hypertension, lipid, weight-loss medications, and med management appointments    Valet parking services will be available as well.             Signed, Ozell Fell, MD  07/13/2023 12:06 PM    [facility]

## 2023-07-13 NOTE — Assessment & Plan Note (Signed)
 The patient has severe, stage D3 (paradoxical low-flow low gradient) aortic stenosis.  This is associated with NYHA functional class III symptoms of exertional dyspnea and fatigue. I have reviewed the natural history of aortic stenosis with the patient and their family members who are present today. We have discussed the limitations of medical therapy and the poor prognosis associated with symptomatic aortic stenosis. We have reviewed potential treatment options, including palliative medical therapy, conventional surgical aortic valve replacement, and transcatheter aortic valve replacement. We discussed treatment options in the context of the patient's specific comorbid medical conditions.  The patient understands that if he decides to pursue TAVR, he will require further evaluation to include coronary angiography, a gated CTA of the heart, and a CTA of the chest, abdomen, and pelvis.  I discussed these procedures and imaging test with him today.  I demonstrated a procedural animation of the TAVR procedure and explained each step of the procedure with them.  They understand that risks of serious and potentially life-threatening complication occur in less than 1 to 2% of cases.  I think his risk of a permanent pacemaker could be as much is 10% with his very prolonged first-degree AV block and history of [employer].  He otherwise does not appear to have any high risk features but we would need to further assess his anatomic suitability for TAVR with CTA studies.  The patient would like to think things over before scheduling his preprocedural studies and he would like to discuss further with his wife.  He is scheduled to see Dr. Michele back in follow-up in about 3 months.  I would be happy to discuss further with him if he decides to move forward.

## 2023-07-14 DIAGNOSIS — M6281 Muscle weakness (generalized): Secondary | ICD-10-CM | POA: Diagnosis not present

## 2023-07-19 ENCOUNTER — Ambulatory Visit: Payer: Medicare Other | Admitting: Podiatry

## 2023-07-20 DIAGNOSIS — M6281 Muscle weakness (generalized): Secondary | ICD-10-CM | POA: Diagnosis not present

## 2023-07-25 DIAGNOSIS — M6281 Muscle weakness (generalized): Secondary | ICD-10-CM | POA: Diagnosis not present

## 2023-07-28 ENCOUNTER — Ambulatory Visit: Payer: Medicare Other | Admitting: Podiatry

## 2023-07-28 DIAGNOSIS — M79674 Pain in right toe(s): Secondary | ICD-10-CM | POA: Diagnosis not present

## 2023-07-28 DIAGNOSIS — B351 Tinea unguium: Secondary | ICD-10-CM

## 2023-07-28 DIAGNOSIS — M79675 Pain in left toe(s): Secondary | ICD-10-CM | POA: Diagnosis not present

## 2023-07-28 NOTE — Progress Notes (Signed)
  Subjective:  Patient ID: Peter Gallegos, male    DOB: 1932/06/08,  MRN: [id number]  Chief Complaint  Patient presents with   rfc    Nail trim    89 y.o. male returns for the above complaint.  Patient presents with thickened elongated dystrophic onychomycosis bilaterally x10.  They are painful to palpation.  Patient states that they are very hard to and painful to ambulate on.  Patient would like to have the repair done.  He has no other acute concerns.  Objective:   There were no vitals filed for this visit.  Podiatric Exam: Vascular: dorsalis pedis and posterior tibial pulses are palpable bilateral. Capillary return is immediate. Temperature gradient is WNL. Skin turgor WNL  Sensorium: Normal Semmes Weinstein monofilament test. Normal tactile sensation bilaterally. Nail Exam: Pt has thick disfigured discolored nails with subungual debris noted bilateral entire nail hallux through fifth toenails.  Pain on palpation to the nails. Ulcer Exam: There is no evidence of ulcer or pre-ulcerative changes or infection. Orthopedic Exam: Muscle tone and strength are WNL. No limitations in general ROM. No crepitus or effusions noted. HAV  B/L.  Hammer toes 2-5  B/L. Skin: Hyperkeratotic lesion noted to bilateral medial aspect of the hallux.  No pinpoint bleeding noted.  Pain on palpation to the lesion. No infection or ulcers.  Dry skin without fissure or pain to the bilateral lower extremity    Assessment & Plan:   No diagnosis found.       Patient was evaluated and treated and all questions answered.  Xerosis bilateral lower extremity -I explained to the patient the etiology of xerosis and various treatment options were extensively discussed.  I explained to the patient the importance of maintaining moisturization of the skin with application of over-the-counter lotion such as Eucerin or Luciderm.  I have asked the patient to apply this twice a day.  If unable to resolve patient will  benefit from prescription lotion.  Porokeratosis bilateral hallux x2 -I explained to patient the etiology of porokeratosis versus treatment options were discussed.  Given the amount of pain that is having I believe patient will benefit from aggressive debridement of the lesion.  Using chisel blade and handle the lesions were debrided down to healthy striated tissue.  No complication noted.  No pinpoint bleeding noted  Onychomycosis with pain  -Nails palliatively debrided as below. -Educated on self-care  Procedure: Nail Debridement Rationale: pain  Type of Debridement: manual, sharp debridement. Instrumentation: Nail nipper, rotary burr. Number of Nails: 10  Procedures and Treatment: Consent by patient was obtained for treatment procedures. The patient understood the discussion of treatment and procedures well. All questions were answered thoroughly reviewed. Debridement of mycotic and hypertrophic toenails, 1 through 5 bilateral and clearing of subungual debris. No ulceration, no infection noted.  Return Visit-Office Procedure: Patient instructed to return to the office for a follow up visit 3 months for continued evaluation and treatment.  Nicholes Rough, DPM    No follow-ups on file.

## 2023-08-01 DIAGNOSIS — M6281 Muscle weakness (generalized): Secondary | ICD-10-CM | POA: Diagnosis not present

## 2023-08-08 DIAGNOSIS — M6281 Muscle weakness (generalized): Secondary | ICD-10-CM | POA: Diagnosis not present

## 2023-08-15 DIAGNOSIS — M6281 Muscle weakness (generalized): Secondary | ICD-10-CM | POA: Diagnosis not present

## 2023-08-17 DIAGNOSIS — M6281 Muscle weakness (generalized): Secondary | ICD-10-CM | POA: Diagnosis not present

## 2023-08-22 DIAGNOSIS — M6281 Muscle weakness (generalized): Secondary | ICD-10-CM | POA: Diagnosis not present

## 2023-08-30 ENCOUNTER — Encounter (HOSPITAL_COMMUNITY): Payer: Self-pay

## 2023-08-30 ENCOUNTER — Emergency Department (HOSPITAL_COMMUNITY)
Admission: EM | Admit: 2023-08-30 | Discharge: 2023-08-30 | Disposition: A | Discharge status: Home / Self Care | Attending: Emergency Medicine | Admitting: Emergency Medicine

## 2023-08-30 ENCOUNTER — Other Ambulatory Visit: Payer: Self-pay

## 2023-08-30 ENCOUNTER — Emergency Department (HOSPITAL_COMMUNITY)

## 2023-08-30 DIAGNOSIS — Z23 Encounter for immunization: Secondary | ICD-10-CM | POA: Insufficient documentation

## 2023-08-30 DIAGNOSIS — S0990XA Unspecified injury of head, initial encounter: Secondary | ICD-10-CM | POA: Diagnosis not present

## 2023-08-30 DIAGNOSIS — W19XXXA Unspecified fall, initial encounter: Secondary | ICD-10-CM

## 2023-08-30 DIAGNOSIS — Z79899 Other long term (current) drug therapy: Secondary | ICD-10-CM | POA: Diagnosis not present

## 2023-08-30 DIAGNOSIS — W01198A Fall on same level from slipping, tripping and stumbling with subsequent striking against other object, initial encounter: Secondary | ICD-10-CM | POA: Insufficient documentation

## 2023-08-30 DIAGNOSIS — S199XXA Unspecified injury of neck, initial encounter: Secondary | ICD-10-CM | POA: Diagnosis not present

## 2023-08-30 DIAGNOSIS — I10 Essential (primary) hypertension: Secondary | ICD-10-CM | POA: Diagnosis not present

## 2023-08-30 DIAGNOSIS — I6782 Cerebral ischemia: Secondary | ICD-10-CM | POA: Diagnosis not present

## 2023-08-30 DIAGNOSIS — G319 Degenerative disease of nervous system, unspecified: Secondary | ICD-10-CM | POA: Diagnosis not present

## 2023-08-30 DIAGNOSIS — S0101XA Laceration without foreign body of scalp, initial encounter: Secondary | ICD-10-CM | POA: Diagnosis not present

## 2023-08-30 LAB — BASIC METABOLIC PANEL
Anion gap: 9 (ref 5–15)
BUN: 14 mg/dL (ref 8–23)
CO2: 27 mmol/L (ref 22–32)
Calcium: 9 mg/dL (ref 8.9–10.3)
Chloride: 108 mmol/L (ref 98–111)
Creatinine, Ser: 1.37 mg/dL — ABNORMAL HIGH (ref 0.61–1.24)
GFR, Estimated: 49 mL/min — ABNORMAL LOW (ref >60)
Glucose, Bld: 90 mg/dL (ref 70–99)
Potassium: 3.8 mmol/L (ref 3.5–5.1)
Sodium: 144 mmol/L (ref 135–145)

## 2023-08-30 LAB — CBC
HCT: 45.5 % (ref 39.0–52.0)
Hemoglobin: 14.7 g/dL (ref 13.0–17.0)
MCH: 30.7 pg (ref 26.0–34.0)
MCHC: 32.3 g/dL (ref 30.0–36.0)
MCV: 95 fL (ref 80.0–100.0)
Platelets: 141 10*3/uL — ABNORMAL LOW (ref 150–400)
RBC: 4.79 MIL/uL (ref 4.22–5.81)
RDW: 13.1 % (ref 11.5–15.5)
WBC: 7.2 10*3/uL (ref 4.0–10.5)
nRBC: 0 % (ref 0.0–0.2)

## 2023-08-30 MED ORDER — BACITRACIN ZINC 500 UNIT/GM EX OINT
TOPICAL_OINTMENT | Freq: Two times a day (BID) | CUTANEOUS | Status: DC
  Filled 2023-08-30: qty 0.9

## 2023-08-30 MED ORDER — LIDOCAINE-EPINEPHRINE (PF) 2 %-1:200000 IJ SOLN
10.0000 mL | Freq: Once | INTRAMUSCULAR | Status: DC
  Filled 2023-08-30: qty 20

## 2023-08-30 MED ORDER — TETANUS-DIPHTH-ACELL PERTUSSIS 5-2.5-18.5 LF-MCG/0.5 IM SUSY
0.5000 mL | PREFILLED_SYRINGE | Freq: Once | INTRAMUSCULAR | Status: AC
  Administered 2023-08-30: 0.5 mL via INTRAMUSCULAR
  Filled 2023-08-30: qty 0.5

## 2023-08-30 NOTE — ED Provider Notes (Signed)
 [place] EMERGENCY DEPARTMENT AT [facility] Provider Note   CSN: [id number] Arrival date & time: 08/30/23  9562     History  Chief Complaint  Patient presents with   Peter Gallegos is a 89 y.o. male.   Fall     Patient has a history of hypertension hypercholesterolemia acid reflux, diverticulosis, aortic stenosis.  Patient is on Aggrenox.  He presents ED for evaluation today after a fall.  Patient states he got tangled in some sheets when he was getting out of bed and ended up falling back onto a rocking chair.  He hit the back of his head on the rocking chair.  Sustained a laceration.  He denies any loss of consciousness.  He is not having any difficulty with chest pain or abdominal pain.  No pain in his extremities.  He does have some pain in the back of his head but is not severe  Home Medications Prior to Admission medications   Medication Sig Start Date End Date Taking? Authorizing Provider  alendronate (FOSAMAX) 70 MG tablet Take 70 mg by mouth once a week. 10/20/19   [provider]  atorvastatin (LIPITOR) 40 MG tablet Take 40 mg by mouth daily. 06/27/19   [provider]  buPROPion (WELLBUTRIN XL) 300 MG 24 hr tablet Take 300 mg by mouth daily.    [provider]  CALCIUM PO Take 1 tablet by mouth daily.    [provider]  Cholecalciferol (VITAMIN D) 125 MCG (5000 UT) CAPS Take 5,000 Units by mouth daily.    [provider]  dipyridamole-aspirin (AGGRENOX) 200-25 MG 12hr capsule Take 1 capsule by mouth 2 (two) times daily.    [provider]  losartan (COZAAR) 25 MG tablet TAKE 1 TABLET BY MOUTH ONCE DAILY IN THE MORNING 03/13/23   Tolia, Sunit, DO  Multiple Vitamin (MULTIVITAMIN WITH MINERALS) TABS tablet Take 1 tablet by mouth daily.    [provider]  sertraline (ZOLOFT) 50 MG tablet Take 50 mg by mouth daily.    [provider]      Allergies    Patient has no known  allergies.    Review of Systems   Review of Systems  Physical Exam Updated Vital Signs BP (!) 191/83 (BP Location: Right Arm)   Pulse 61   Temp 97.7 F (36.5 C) (Oral)   Resp 17   Ht 1.676 m (5\' 6" )   Wt 59 kg   SpO2 100%   BMI 20.98 kg/m  Physical Exam Vitals and nursing note reviewed.  Constitutional:      Appearance: He is well-developed. He is not diaphoretic.  HENT:     Head: Normocephalic.     Comments: Laceration and hematoma posterior occiput    Right Ear: External ear normal.     Left Ear: External ear normal.  Eyes:     General: No scleral icterus.       Right eye: No discharge.        Left eye: No discharge.     Conjunctiva/sclera: Conjunctivae normal.  Neck:     Trachea: No tracheal deviation.  Cardiovascular:     Rate and Rhythm: Normal rate.  Pulmonary:     Effort: Pulmonary effort is normal. No respiratory distress.     Breath sounds: No stridor.  Abdominal:     General: There is no distension.  Musculoskeletal:        General: No swelling or deformity.  Cervical back: Normal and neck supple. No tenderness.     Thoracic back: Normal. No tenderness.     Lumbar back: Normal. No tenderness.     Comments: No tenderness palpation bilateral upper extremities and lower extremities full major motion  Skin:    General: Skin is warm and dry.     Findings: No rash.  Neurological:     Mental Status: He is alert. Mental status is at baseline.     GCS: GCS eye subscore is 4. GCS verbal subscore is 5. GCS motor subscore is 6.     Cranial Nerves: No cranial nerve deficit, dysarthria or facial asymmetry.     Motor: No weakness or seizure activity.     Coordination: Rapid alternating movements normal.     ED Results / Procedures / Treatments   Labs (all labs ordered are listed, but only abnormal results are displayed) Labs Reviewed  CBC - Abnormal; Notable for the following components:      Result Value   Platelets 141 (*)    All other components  within normal limits  BASIC METABOLIC PANEL - Abnormal; Notable for the following components:   Creatinine, Ser 1.37 (*)    GFR, Estimated 49 (*)    All other components within normal limits    EKG None  Radiology CT Head Wo Contrast Result Date: 08/30/2023 CLINICAL DATA:  Minor head trauma EXAM: CT HEAD WITHOUT CONTRAST CT CERVICAL SPINE WITHOUT CONTRAST TECHNIQUE: Multidetector CT imaging of the head and cervical spine was performed following the standard protocol without intravenous contrast. Multiplanar CT image reconstructions of the cervical spine were also generated. RADIATION DOSE REDUCTION: This exam was performed according to the departmental dose-optimization program which includes automated exposure control, adjustment of the mA and/or kV according to patient size and/or use of iterative reconstruction technique. COMPARISON:  11/22/2020 head CT FINDINGS: CT HEAD FINDINGS Brain: No evidence of acute infarction, hemorrhage, hydrocephalus, extra-axial collection or mass lesion/mass effect. Generalized brain atrophy and extensive chronic small vessel ischemia in the cerebral white matter. Vascular: No hyperdense vessel or unexpected calcification. Skull: Left posterior scalp swelling without calvarial fracture. Sinuses/Orbits: Negative CT CERVICAL SPINE FINDINGS Alignment: C4-5 mild anterolisthesis which is degenerative. No traumatic malalignment Skull base and vertebrae: No acute fracture or aggressive bone lesion. Soft tissues and spinal canal: No prevertebral fluid or swelling. No visible canal hematoma. Disc levels:  Generalized degenerative endplate and facet spurring Upper chest: No evidence of injury IMPRESSION: No evidence of acute intracranial or cervical spine injury. Electronically Signed   By: Tiburcio Pea M.D.   On: 08/30/2023 08:44   CT Cervical Spine Wo Contrast Result Date: 08/30/2023 CLINICAL DATA:  Minor head trauma EXAM: CT HEAD WITHOUT CONTRAST CT CERVICAL SPINE WITHOUT  CONTRAST TECHNIQUE: Multidetector CT imaging of the head and cervical spine was performed following the standard protocol without intravenous contrast. Multiplanar CT image reconstructions of the cervical spine were also generated. RADIATION DOSE REDUCTION: This exam was performed according to the departmental dose-optimization program which includes automated exposure control, adjustment of the mA and/or kV according to patient size and/or use of iterative reconstruction technique. COMPARISON:  11/22/2020 head CT FINDINGS: CT HEAD FINDINGS Brain: No evidence of acute infarction, hemorrhage, hydrocephalus, extra-axial collection or mass lesion/mass effect. Generalized brain atrophy and extensive chronic small vessel ischemia in the cerebral white matter. Vascular: No hyperdense vessel or unexpected calcification. Skull: Left posterior scalp swelling without calvarial fracture. Sinuses/Orbits: Negative CT CERVICAL SPINE FINDINGS Alignment: C4-5 mild anterolisthesis which  is degenerative. No traumatic malalignment Skull base and vertebrae: No acute fracture or aggressive bone lesion. Soft tissues and spinal canal: No prevertebral fluid or swelling. No visible canal hematoma. Disc levels:  Generalized degenerative endplate and facet spurring Upper chest: No evidence of injury IMPRESSION: No evidence of acute intracranial or cervical spine injury. Electronically Signed   By: Tiburcio Pea M.D.   On: 08/30/2023 08:44    Procedures Procedures    Medications Ordered in ED Medications  lidocaine-EPINEPHrine (XYLOCAINE W/EPI) 2 %-1:200000 (PF) injection 10 mL (has no administration in time range)  bacitracin ointment (has no administration in time range)  Tdap (BOOSTRIX) injection 0.5 mL (0.5 mLs Intramuscular Given 08/30/23 0732)    ED Course/ Medical Decision Making/ A&P Clinical Course as of 08/30/23 0913  Wed Aug 30, 2023  0852 CBC normal.  Metabolic panel shows creatinine of 1.37 [JK]  0853 Head CT  and C-spine CT without signs of traumatic injury [JK]    Clinical Course User Index [JK] Linwood Dibbles, MD                                 Medical Decision Making Problems Addressed: Fall, initial encounter: acute illness or injury that poses a threat to life or bodily functions Laceration of scalp, initial encounter: acute illness or injury that poses a threat to life or bodily functions  Amount and/or Complexity of Data Reviewed Labs: ordered. Decision-making details documented in ED Course. Radiology: ordered and independent interpretation performed.  Risk Prescription drug management.   Patient presented to the ED for evaluation after mechanical fall.  With his age and use of Aggrenox a CT scan of the head and C-spine was performed to rule out any serious injury.  Fortunately CT scans are reassuring.  His laboratory tests are unremarkable.  Laceration was treated with staple closure.  Patient without signs of any other varus injury.  Incidentally noted to be hypertensive.  Patient is asymptomatic.  Will have him continue his home medications and recommend follow-up with PCP to have that rechecked        Final Clinical Impression(s) / ED Diagnoses Final diagnoses:  Fall, initial encounter  Laceration of scalp, initial encounter    Rx / DC Orders ED Discharge Orders     None         Linwood Dibbles, MD 08/30/23 [phone]

## 2023-08-30 NOTE — ED Triage Notes (Signed)
 Pt BIB [facility] for mechanical fall, per EMS pt got tangled in sheets and fell straight back on rocking chair, lac on back of head, no loc, no pain besides head, hx tia, 1st degree HB, VSS per EMS. Aox4.

## 2023-08-30 NOTE — Discharge Instructions (Addendum)
 Your blood pressure was elevated in the ED today.  Continue your medications.  Follow up with your doctor to have that rechecked.

## 2023-08-30 NOTE — ED Provider Notes (Signed)
.  Laceration Repair  Date/Time: 08/30/2023 8:27 AM  Performed by: Gareth Eagle, PA-C Authorized by: Gareth Eagle, PA-C   Consent:    Consent obtained:  Verbal   Consent given by:  Patient   Risks discussed:  Infection, need for additional repair and retained foreign body Universal protocol:    Procedure explained and questions answered to patient or proxy's satisfaction: yes     Relevant documents present and verified: yes     Test results available: yes     Patient identity confirmed:  Verbally with patient and arm band Anesthesia:    Anesthesia method:  None Laceration details:    Location:  Scalp   Scalp location:  Occipital   Length (cm):  1.5   Depth (mm):  2 Exploration:    Limited defect created (wound extended): no     Hemostasis achieved with:  Direct pressure   Imaging outcome: foreign body noted     Wound exploration: wound explored through full range of motion     Wound extent: areolar tissue violated     Contaminated: no   Treatment:    Area cleansed with:  Saline   Amount of cleaning:  Extensive   Irrigation solution:  Sterile saline   Irrigation method:  Pressure wash   Debridement:  None   Undermining:  None Skin repair:    Repair method:  Staples   Number of staples:  2 Approximation:    Approximation:  Close Repair type:    Repair type:  Simple Post-procedure details:    Dressing:  Non-adherent dressing   Procedure completion:  Tolerated well, no immediate complications     Gareth Eagle, PA-C 08/30/23 0830    Linwood Dibbles, MD 08/31/23 1610

## 2023-09-07 ENCOUNTER — Other Ambulatory Visit: Payer: Self-pay | Admitting: Cardiology

## 2023-09-07 DIAGNOSIS — I10 Essential (primary) hypertension: Secondary | ICD-10-CM

## 2023-09-08 DIAGNOSIS — Z4802 Encounter for removal of sutures: Secondary | ICD-10-CM | POA: Diagnosis not present

## 2023-09-08 DIAGNOSIS — I10 Essential (primary) hypertension: Secondary | ICD-10-CM | POA: Diagnosis not present

## 2023-09-08 DIAGNOSIS — Z9181 History of falling: Secondary | ICD-10-CM | POA: Diagnosis not present

## 2023-09-08 DIAGNOSIS — E785 Hyperlipidemia, unspecified: Secondary | ICD-10-CM | POA: Diagnosis not present

## 2023-09-13 DIAGNOSIS — K219 Gastro-esophageal reflux disease without esophagitis: Secondary | ICD-10-CM | POA: Diagnosis not present

## 2023-09-13 DIAGNOSIS — Z0000 Encounter for general adult medical examination without abnormal findings: Secondary | ICD-10-CM | POA: Diagnosis not present

## 2023-09-13 DIAGNOSIS — I440 Atrioventricular block, first degree: Secondary | ICD-10-CM | POA: Diagnosis not present

## 2023-09-13 DIAGNOSIS — I10 Essential (primary) hypertension: Secondary | ICD-10-CM | POA: Diagnosis not present

## 2023-09-13 DIAGNOSIS — Z8673 Personal history of transient ischemic attack (TIA), and cerebral infarction without residual deficits: Secondary | ICD-10-CM | POA: Diagnosis not present

## 2023-09-13 DIAGNOSIS — Z7901 Long term (current) use of anticoagulants: Secondary | ICD-10-CM | POA: Diagnosis not present

## 2023-09-13 DIAGNOSIS — N1831 Chronic kidney disease, stage 3a: Secondary | ICD-10-CM | POA: Diagnosis not present

## 2023-09-13 DIAGNOSIS — M810 Age-related osteoporosis without current pathological fracture: Secondary | ICD-10-CM | POA: Diagnosis not present

## 2023-09-13 DIAGNOSIS — L578 Other skin changes due to chronic exposure to nonionizing radiation: Secondary | ICD-10-CM | POA: Diagnosis not present

## 2023-10-20 ENCOUNTER — Ambulatory Visit: Payer: Medicare Other | Admitting: Podiatry

## 2023-10-20 ENCOUNTER — Encounter: Payer: Self-pay | Admitting: Podiatry

## 2023-10-20 VITALS — Ht 66.0 in | Wt 130.0 lb

## 2023-10-20 DIAGNOSIS — M79674 Pain in right toe(s): Secondary | ICD-10-CM

## 2023-10-20 DIAGNOSIS — B351 Tinea unguium: Secondary | ICD-10-CM | POA: Diagnosis not present

## 2023-10-20 DIAGNOSIS — M79675 Pain in left toe(s): Secondary | ICD-10-CM | POA: Diagnosis not present

## 2023-10-20 NOTE — Progress Notes (Signed)
  Subjective:  Patient ID: Peter Gallegos, male    DOB: December 22, 1932,  MRN: [id number]  Chief Complaint  Patient presents with   Nail Problem    Patient is here for routine foot care.   89 y.o. male returns for the above complaint.  Patient presents with thickened elongated dystrophic onychomycosis bilaterally x10.  They are painful to palpation.  Patient states that they are very hard to and painful to ambulate on.  Patient would like to have the repair done.  He has no other acute concerns.  Objective:   There were no vitals filed for this visit.  Podiatric Exam: Vascular: dorsalis pedis and posterior tibial pulses are palpable bilateral. Capillary return is immediate. Temperature gradient is WNL. Skin turgor WNL  Sensorium: Normal Semmes Weinstein monofilament test. Normal tactile sensation bilaterally. Nail Exam: Pt has thick disfigured discolored nails with subungual debris noted bilateral entire nail hallux through fifth toenails.  Pain on palpation to the nails. Ulcer Exam: There is no evidence of ulcer or pre-ulcerative changes or infection. Orthopedic Exam: Muscle tone and strength are WNL. No limitations in general ROM. No crepitus or effusions noted. HAV  B/L.  Hammer toes 2-5  B/L. Skin: Hyperkeratotic lesion noted to bilateral medial aspect of the hallux.  No pinpoint bleeding noted.  Pain on palpation to the lesion. No infection or ulcers.  Dry skin without fissure or pain to the bilateral lower extremity    Assessment & Plan:   No diagnosis found.       Patient was evaluated and treated and all questions answered.  Xerosis bilateral lower extremity -I explained to the patient the etiology of xerosis and various treatment options were extensively discussed.  I explained to the patient the importance of maintaining moisturization of the skin with application of over-the-counter lotion such as Eucerin or Luciderm.  I have asked the patient to apply this twice a day.   If unable to resolve patient will benefit from prescription lotion.  Porokeratosis bilateral hallux x2 -I explained to patient the etiology of porokeratosis versus treatment options were discussed.  Given the amount of pain that is having I believe patient will benefit from aggressive debridement of the lesion.  Using chisel blade and handle the lesions were debrided down to healthy striated tissue.  No complication noted.  No pinpoint bleeding noted  Onychomycosis with pain  -Nails palliatively debrided as below. -Educated on self-care  Procedure: Nail Debridement Rationale: pain  Type of Debridement: manual, sharp debridement. Instrumentation: Nail nipper, rotary burr. Number of Nails: 10  Procedures and Treatment: Consent by patient was obtained for treatment procedures. The patient understood the discussion of treatment and procedures well. All questions were answered thoroughly reviewed. Debridement of mycotic and hypertrophic toenails, 1 through 5 bilateral and clearing of subungual debris. No ulceration, no infection noted.  Return Visit-Office Procedure: Patient instructed to return to the office for a follow up visit 3 months for continued evaluation and treatment.  Tinnie Forehand, DPM    No follow-ups on file.

## 2023-10-24 ENCOUNTER — Telehealth: Payer: Self-pay | Admitting: Cardiology

## 2023-10-24 NOTE — Telephone Encounter (Signed)
 Pt's wife is requesting a callback regarding her wanting to know why pt would have an echo scheduled in May but not due to be seen in office again until July. Please advise.

## 2023-10-24 NOTE — Telephone Encounter (Signed)
 Spoke with wife and she is aware Dr. Copper ordered it be 3 months after OV prior to seeing Dr. Albert Huff. Its ok to get his echo done

## 2023-11-03 ENCOUNTER — Ambulatory Visit (HOSPITAL_COMMUNITY)
Admission: RE | Admit: 2023-11-03 | Discharge: 2023-11-03 | Disposition: A | Discharge status: Home / Self Care | Payer: Medicare Other | Source: Ambulatory Visit | Attending: Cardiology | Admitting: Cardiology

## 2023-11-03 DIAGNOSIS — I350 Nonrheumatic aortic (valve) stenosis: Secondary | ICD-10-CM

## 2023-11-03 LAB — ECHOCARDIOGRAM COMPLETE
AR max vel: 0.66 cm2
AV Area VTI: 0.68 cm2
AV Area mean vel: 0.59 cm2
AV Mean grad: 17 mmHg
AV Peak grad: 26.4 mmHg
Ao pk vel: 2.57 m/s
Area-P 1/2: 6.37 cm2
S' Lateral: 2.8 cm

## 2023-11-09 DIAGNOSIS — M8589 Other specified disorders of bone density and structure, multiple sites: Secondary | ICD-10-CM | POA: Diagnosis not present

## 2023-11-09 DIAGNOSIS — Z8673 Personal history of transient ischemic attack (TIA), and cerebral infarction without residual deficits: Secondary | ICD-10-CM | POA: Diagnosis not present

## 2023-11-09 DIAGNOSIS — M810 Age-related osteoporosis without current pathological fracture: Secondary | ICD-10-CM | POA: Diagnosis not present

## 2023-11-09 DIAGNOSIS — I10 Essential (primary) hypertension: Secondary | ICD-10-CM | POA: Diagnosis not present

## 2023-11-19 ENCOUNTER — Ambulatory Visit: Payer: Self-pay | Admitting: Cardiovascular Disease

## 2023-11-23 DIAGNOSIS — M810 Age-related osteoporosis without current pathological fracture: Secondary | ICD-10-CM | POA: Diagnosis not present

## 2023-11-23 DIAGNOSIS — I10 Essential (primary) hypertension: Secondary | ICD-10-CM | POA: Diagnosis not present

## 2023-12-06 DIAGNOSIS — I10 Essential (primary) hypertension: Secondary | ICD-10-CM | POA: Diagnosis not present

## 2023-12-27 DIAGNOSIS — I951 Orthostatic hypotension: Secondary | ICD-10-CM | POA: Diagnosis not present

## 2023-12-27 DIAGNOSIS — I10 Essential (primary) hypertension: Secondary | ICD-10-CM | POA: Diagnosis not present

## 2023-12-27 DIAGNOSIS — I350 Nonrheumatic aortic (valve) stenosis: Secondary | ICD-10-CM | POA: Diagnosis not present

## 2024-01-02 ENCOUNTER — Ambulatory Visit: Attending: Cardiology | Admitting: Cardiology

## 2024-01-02 ENCOUNTER — Encounter: Payer: Self-pay | Admitting: Cardiology

## 2024-01-02 VITALS — BP 100/61 | HR 71 | Resp 16 | Ht 66.0 in | Wt 149.0 lb

## 2024-01-02 DIAGNOSIS — I459 Conduction disorder, unspecified: Secondary | ICD-10-CM

## 2024-01-02 DIAGNOSIS — R0609 Other forms of dyspnea: Secondary | ICD-10-CM

## 2024-01-02 DIAGNOSIS — E782 Mixed hyperlipidemia: Secondary | ICD-10-CM

## 2024-01-02 DIAGNOSIS — I350 Nonrheumatic aortic (valve) stenosis: Secondary | ICD-10-CM

## 2024-01-02 DIAGNOSIS — I10 Essential (primary) hypertension: Secondary | ICD-10-CM | POA: Diagnosis not present

## 2024-01-02 NOTE — Progress Notes (Unsigned)
 Cardiology Office Note:  .   ID:  Peter Gallegos, DOB [birth year], MRN [id number] PCP:  [name], MD  Former Cardiology Providers: N/A [facility] Providers Cardiologist:  Madonna Large, DO , [facility] (established care 09/20/2021 ) Electrophysiologist:  None  Click to update primary MD,subspecialty MD or APP then REFRESH:1}    Chief Complaint  Patient presents with   Follow-up    34-month follow-up for aortic stenosis    History of Present Illness: .   Peter Gallegos is a 89 y.o. male whose past medical history and cardiovascular risk factors includes: Aortic stenosis, hypertension, history of TIA, family history of heart disease, history of alcohol abuse, hyperlipidemia, subarachnoid hematoma, PVCs, first-degree AV block, history of PACs, advanced age.   Patient is being followed by the practice given his aortic stenosis and underlying conduction disease.  Patient is accompanied by his wife and son Charolotte) at today's office visit.  The patient is noted to have paradoxical low-flow low gradient aortic stenosis and has been followed with serial echocardiograms without episodes of anginal chest pain, heart failure symptoms, or syncopal events.  In the past he did endorse lightheaded and dizziness but the symptoms shortly resolved after discontinuation of triamterene  hydrochlorothiazide  and transitioning him to losartan .  In the past, echocardiogram noted marginal reduction in LVEF from 60-65% to 55-60%, and severe paradoxical low-flow low gradient with a dimensional index of 0.21.  Clinically denies anginal chest pain, heart failure symptoms, or syncopal events.  However he is becoming more tired and fatigued and overall physical endurance is reducing.  The patient and wife would like to be considered for aortic valve replacement.  At the last office visit in January 2025 he was referred to structural heart for evaluation for possible TAVR.  He was kindly seen by Dr. Wonda on  07/13/2023.  TAVR was offered to the patient but after shared decision between the patient and wife they chose to hold off.  Since last office visit patient states that he has been experiencing shortness of breath which is frequent, feeling tired, fatigued, and at times lightheaded and dizziness when he stands up.  No frank syncope or anginal chest pain.  Conduction disease: Patient has a history of prolonged first-degree AV block with Orest Elders physiology.  He has had a Zio patch in the past which noted good heart rate variability and prior GXT in June 2023 illustrated fair functional capacity for age at 7 METS and achieved 84% of age-predicted maximum heart rate.  He has seen Dr. Waddell in the past who recommended close monitoring and if he becomes symptomatic to consider evaluation for possible pacemaker implant.    Patient is wife states that they have been working with Pharm.D. at the PCPs office has been trying to titrate his blood pressure medications but with up titration of medical therapy he was getting more symptomatic.  Therefore losartan  was reduced from 100 mg p.o. daily down to 50 mg p.o. daily and he is also on amlodipine 5 mg p.o. daily.  Review of Systems: .   Review of Systems  Cardiovascular:  Negative for chest pain, claudication, irregular heartbeat, leg swelling, near-syncope, orthopnea, palpitations, paroxysmal nocturnal dyspnea and syncope.  Respiratory:  Negative for shortness of breath.   Hematologic/Lymphatic: Negative for bleeding problem.  Neurological:  Positive for dizziness (when changing position) and light-headedness (change in position).    Studies Reviewed:   EKG: 06/02/2022: Sinus rhythm, 67 bpm, second-degree type I Wenckebach physiology. December 02, 2022:  Sinus rhythm with 66 bpm, prolonged first-degree AV block, diffuse T wave abnormality.  EKG Interpretation July 06 2023 08:48:38 EST Sinus rhythm prolong first degree AV block. Left axis deviation  Minimal voltage criteria for LVH, may be normal variant ( R in aVL ) When compared with ECG of December 02 2022 no significant change.  Echocardiogram: June 21 2023 LVEF: 55 to 60% Diastolic Function: Grade 1 Left atrial size moderately dilated Regurgitation: Mild to moderate MR Stenosis: Severe paradoxical low-flow low gradient aortic stenosis (stroke-volume index 30, peak velocity 3 m/s, mean gradient 19 mmHg, AVA per VTI 0.72 cm2, dimensional index 0.21). Estimated RAP 3 mmHg See report for additional details  10/2023 LVEF 60 to 65%, right ventricular size and function not well-visualized, mild to moderate MR, mild AR, Severe aortic valve stenosis. Mild AS by gradients (Vmax 2.6 m/s, MG ), but severe by  AVA (0.7cm^2) and DI (0.22). Low SV index (26 cc/m^2), suspect paradoxical low flow low gradient severe AS.  Cardiac monitor (Zio Patch): Patch Wear Time:  6 days and 23 hours  Dominant rhythm sinus with first-degree AV block. Heart rate 30-102 bpm.  Avg HR 68 bpm. No atrial fibrillation, supraventricular tachycardia, ventricular tachycardia, high grade AV block, pauses (3 seconds or longer). Minimum HR 30 bpm, asymptomatic, on 09/26/2021, at 11:20 PM, sinus with second-degree type I AV block. Total ventricular ectopic burden <1%. Total supraventricular ectopic burden 0%. Patient triggered events: 0.  RADIOLOGY: N/A  Risk Assessment/Calculations:   NA   Labs:       Latest Ref Rng & Units 08/30/2023    7:26 AM 11/23/2020    4:35 AM 11/22/2020    9:24 AM  CBC  WBC 4.0 - 10.5 K/uL 7.2  9.4  11.2   Hemoglobin 13.0 - 17.0 g/dL 85.2  85.9  84.6   Hematocrit 39.0 - 52.0 % 45.5  40.8  45.4   Platelets 150 - 400 K/uL 141  194  213       Latest Ref Rng & Units 08/30/2023    7:26 AM 12/09/2022    8:28 AM 11/24/2020    2:54 AM  CMP  Glucose 70 - 99 mg/dL 90  81  891   BUN 8 - 23 mg/dL 14  21  17    Creatinine 0.61 - 1.24 mg/dL 8.62  8.75  9.02   Sodium 135 - 145 mmol/L 144   144  135   Potassium 3.5 - 5.1 mmol/L 3.8  4.1  3.4   Chloride 98 - 111 mmol/L 108  105  102   CO2 22 - 32 mmol/L 27  27  26    Calcium 8.9 - 10.3 mg/dL 9.0  9.5  8.4     No results found for: CHOL, HDL, LDLCALC, LDLDIRECT, TRIG, CHOLHDL No results for input(s): LIPOA in the last 8760 hours. No components found for: NTPROBNP No results for input(s): PROBNP in the last 8760 hours. No results for input(s): TSH in the last 8760 hours.  External Labs: Collected: September 08, 2023 provided by PCP. BUN 17, creatinine 1.29. eGFR 53. Potassium 4. AST, ALT, alkaline phosphatase within normal limits. Hemoglobin 15  Physical Exam:    Today's Vitals   01/02/24 1008  BP: 100/61  Pulse: 71  Resp: 16  SpO2: 92%  Height: 5' 6 (1.676 m)   Body mass index is 20.98 kg/m. Wt Readings from Last 3 Encounters:  10/20/23 130 lb (59 kg)  08/30/23 130 lb (59 kg)  07/13/23 153  lb 3.2 oz (69.5 kg)    Physical Exam  Constitutional: No distress.  Age appropriate, hemodynamically stable.   Neck: No JVD present.  Cardiovascular: Normal rate, regular rhythm, S1 normal, S2 normal, intact distal pulses and normal pulses. Exam reveals no gallop, no S3 and no S4.  Murmur heard. Midsystolic murmur is present with a grade of 3/6 at the upper right sternal border. Pulmonary/Chest: Effort normal and breath sounds normal. No stridor. He has no wheezes. He has no rales.  Abdominal: Soft. Bowel sounds are normal. He exhibits no distension. There is no abdominal tenderness.  Musculoskeletal:        General: No edema.     Cervical back: Neck supple.  Neurological: He is alert and oriented to person, place, and time. He has intact cranial nerves (2-12).  Skin: Skin is warm and moist.     Impression & Recommendation(s):  Impression:   ICD-10-CM   1. Nonrheumatic aortic valve stenosis  I35.0     2. Dyspnea on exertion  R06.09     3. Conduction disorder of the heart  I45.9     4. Benign  hypertension  I10     5. Mixed hyperlipidemia  E78.2       Recommendation(s):  Nonrheumatic aortic valve stenosis Dyspnea on exertion Patient has paradoxical low-flow low gradient aortic stenosis For which he is becoming more symptomatic since last office visit. Most recent echocardiogram from May 2025 reviewed In January 2025 I had referred him to structural heart team for possible TAVR if he was felt to be an appropriate candidate.  He was kindly seen by Dr. Wonda and was offered to proceed forward with TAVR evaluation; however, at that time both patient and wife chose not to proceed forward.  At today's visit we rediscussed the pathophysiology of aortic stenosis and how TAVR may reduce his symptoms of shortness of breath.  Patient, wife, and son are going to be discuss this more at home and will reach back to the office if they would like to proceed forward with the evaluation. Given his underlying conduction disease they are aware that undergoing TAVR may predispose him to possible pacemaker implant which should be considered as part of the decision making process. In the interim patient is advised to keep himself hydrated, avoid hypotension. If he has symptoms of anginal chest pain, florid heart failure, or syncope he should go to the closest ER via EMS for further evaluation and management  Conduction disorder of the heart & marked first-degree AV block & history of Wenckebach physiology He did undergo Zio patch in the past which noted good heart rate variability. GXT back in June 2023 -he achieved 84% of age-predicted maximum heart rate and 7 METS. He has been evaluated by Dr. Waddell in the past who recommended close monitoring and if becomes symptomatic could consider pacemaker implant. Continue to monitor for now. Not on AV nodal blocking agents I suspect when the PharmD at his PCP was trying to uptitrate antihypertensive medications he is becoming more symptomatic given his  underlying valvular heart disease and conduction disease. Will continue losartan  for now. Discontinue amlodipine  Benign hypertension Office blood pressures are soft. Recommended discontinuation of amlodipine for now. Would be acceptable given his valve disease, conduction disease, and age to keep SBP 130-140 mmHg  Mixed hyperlipidemia Continue Lipitor 40 mg p.o. nightly  Orders Placed:  No orders of the defined types were placed in this encounter.   Final Medication List:   No  orders of the defined types were placed in this encounter.   Medications Discontinued During This Encounter  Medication Reason   losartan  (COZAAR ) 25 MG tablet Dose change   amLODipine (NORVASC) 5 MG tablet Discontinued by provider     Current Outpatient Medications:    alendronate (FOSAMAX) 70 MG tablet, Take 70 mg by mouth once a week., Disp: , Rfl:    atorvastatin (LIPITOR) 40 MG tablet, Take 40 mg by mouth daily., Disp: , Rfl:    buPROPion  (WELLBUTRIN  XL) 300 MG 24 hr tablet, Take 300 mg by mouth daily., Disp: , Rfl:    CALCIUM PO, Take 1 tablet by mouth daily., Disp: , Rfl:    Cholecalciferol (VITAMIN D) 125 MCG (5000 UT) CAPS, Take 5,000 Units by mouth daily., Disp: , Rfl:    dipyridamole -aspirin  (AGGRENOX ) 200-25 MG 12hr capsule, Take 1 capsule by mouth 2 (two) times daily., Disp: , Rfl:    KLOR-CON  10 10 MEQ tablet, Take 10 mEq by mouth once., Disp: , Rfl:    losartan  (COZAAR ) 50 MG tablet, Take 50 mg by mouth daily., Disp: , Rfl:    Multiple Vitamin (MULTIVITAMIN WITH MINERALS) TABS tablet, Take 1 tablet by mouth daily., Disp: , Rfl:    sertraline  (ZOLOFT ) 50 MG tablet, Take 50 mg by mouth daily., Disp: , Rfl:   Consent:   NA  Disposition:   82-month follow-up visit.  His questions and concerns were addressed to his satisfaction. He voices understanding of the recommendations provided during this encounter.    Signed, Madonna Michele HAS, [facility]  A Division of   [facility] [address] [place], [place] [address]  [place], [place] [address]  01/03/2024 2:35 PM

## 2024-01-02 NOTE — Patient Instructions (Signed)
 Medication Instructions:  STOP Amlodipine  *If you need a refill on your cardiac medications before your next appointment, please call your pharmacy*  Lab Work: None ordered today. If you have labs (blood work) drawn today and your tests are completely normal, you will receive your results only by: MyChart Message (if you have MyChart) OR A paper copy in the mail If you have any lab test that is abnormal or we need to change your treatment, we will call you to review the results.  Testing/Procedures: None ordered today.  Follow-Up: At [facility], you and your health needs are our priority.  As part of our continuing mission to provide you with exceptional heart care, our providers are all part of one team.  This team includes your primary Cardiologist (physician) and Advanced Practice Providers or APPs (Physician Assistants and Nurse Practitioners) who all work together to provide you with the care you need, when you need it.  Your next appointment:   6 month(s)  Provider:   Madonna Large, DO    We recommend signing up for the patient portal called MyChart.  Sign up information is provided on this After Visit Summary.  MyChart is used to connect with patients for Virtual Visits (Telemedicine).  Patients are able to view lab/test results, encounter notes, upcoming appointments, etc.  Non-urgent messages can be sent to your provider as well.   To learn more about what you can do with MyChart, go to [email or web address].   Other Instructions It is reasonable to keep systolic blood pressure (top number) around 140.  Call our office back if you decide you would like to be reevaluated for TAVR procedure.

## 2024-01-03 ENCOUNTER — Encounter: Payer: Self-pay | Admitting: Cardiology

## 2024-01-13 ENCOUNTER — Other Ambulatory Visit: Payer: Self-pay

## 2024-01-13 ENCOUNTER — Emergency Department (HOSPITAL_COMMUNITY)

## 2024-01-13 ENCOUNTER — Encounter (HOSPITAL_COMMUNITY): Payer: Self-pay

## 2024-01-13 ENCOUNTER — Inpatient Hospital Stay (HOSPITAL_COMMUNITY)

## 2024-01-13 ENCOUNTER — Encounter (HOSPITAL_COMMUNITY)
Admission: EM | Disposition: A | Discharge status: Home Health | Payer: Self-pay | Source: Home / Self Care | Attending: Family Medicine

## 2024-01-13 ENCOUNTER — Inpatient Hospital Stay (HOSPITAL_COMMUNITY)
Admission: EM | Admit: 2024-01-13 | Discharge: 2024-01-15 | DRG: 378 | Disposition: A | Discharge status: Home Health | Attending: Internal Medicine | Admitting: Internal Medicine

## 2024-01-13 DIAGNOSIS — K3189 Other diseases of stomach and duodenum: Secondary | ICD-10-CM | POA: Diagnosis not present

## 2024-01-13 DIAGNOSIS — J984 Other disorders of lung: Secondary | ICD-10-CM | POA: Diagnosis not present

## 2024-01-13 DIAGNOSIS — F1011 Alcohol abuse, in remission: Secondary | ICD-10-CM | POA: Diagnosis present

## 2024-01-13 DIAGNOSIS — K449 Diaphragmatic hernia without obstruction or gangrene: Secondary | ICD-10-CM | POA: Diagnosis not present

## 2024-01-13 DIAGNOSIS — Z87891 Personal history of nicotine dependence: Secondary | ICD-10-CM

## 2024-01-13 DIAGNOSIS — E1122 Type 2 diabetes mellitus with diabetic chronic kidney disease: Secondary | ICD-10-CM | POA: Diagnosis not present

## 2024-01-13 DIAGNOSIS — K2970 Gastritis, unspecified, without bleeding: Secondary | ICD-10-CM | POA: Diagnosis not present

## 2024-01-13 DIAGNOSIS — I129 Hypertensive chronic kidney disease with stage 1 through stage 4 chronic kidney disease, or unspecified chronic kidney disease: Secondary | ICD-10-CM | POA: Diagnosis not present

## 2024-01-13 DIAGNOSIS — K2971 Gastritis, unspecified, with bleeding: Secondary | ICD-10-CM | POA: Diagnosis not present

## 2024-01-13 DIAGNOSIS — F418 Other specified anxiety disorders: Secondary | ICD-10-CM

## 2024-01-13 DIAGNOSIS — Z7983 Long term (current) use of bisphosphonates: Secondary | ICD-10-CM

## 2024-01-13 DIAGNOSIS — K259 Gastric ulcer, unspecified as acute or chronic, without hemorrhage or perforation: Secondary | ICD-10-CM

## 2024-01-13 DIAGNOSIS — K920 Hematemesis: Secondary | ICD-10-CM | POA: Diagnosis not present

## 2024-01-13 DIAGNOSIS — Z66 Do not resuscitate: Secondary | ICD-10-CM | POA: Diagnosis not present

## 2024-01-13 DIAGNOSIS — Z79899 Other long term (current) drug therapy: Secondary | ICD-10-CM

## 2024-01-13 DIAGNOSIS — Z8673 Personal history of transient ischemic attack (TIA), and cerebral infarction without residual deficits: Secondary | ICD-10-CM

## 2024-01-13 DIAGNOSIS — R404 Transient alteration of awareness: Secondary | ICD-10-CM | POA: Diagnosis not present

## 2024-01-13 DIAGNOSIS — I350 Nonrheumatic aortic (valve) stenosis: Secondary | ICD-10-CM | POA: Diagnosis present

## 2024-01-13 DIAGNOSIS — Z8249 Family history of ischemic heart disease and other diseases of the circulatory system: Secondary | ICD-10-CM

## 2024-01-13 DIAGNOSIS — R58 Hemorrhage, not elsewhere classified: Secondary | ICD-10-CM | POA: Diagnosis not present

## 2024-01-13 DIAGNOSIS — N1831 Chronic kidney disease, stage 3a: Secondary | ICD-10-CM | POA: Diagnosis present

## 2024-01-13 DIAGNOSIS — K254 Chronic or unspecified gastric ulcer with hemorrhage: Principal | ICD-10-CM | POA: Diagnosis present

## 2024-01-13 DIAGNOSIS — Z860101 Personal history of adenomatous and serrated colon polyps: Secondary | ICD-10-CM

## 2024-01-13 DIAGNOSIS — F32A Depression, unspecified: Secondary | ICD-10-CM | POA: Diagnosis present

## 2024-01-13 DIAGNOSIS — K250 Acute gastric ulcer with hemorrhage: Secondary | ICD-10-CM | POA: Diagnosis not present

## 2024-01-13 DIAGNOSIS — D62 Acute posthemorrhagic anemia: Secondary | ICD-10-CM | POA: Diagnosis not present

## 2024-01-13 DIAGNOSIS — I440 Atrioventricular block, first degree: Secondary | ICD-10-CM | POA: Diagnosis not present

## 2024-01-13 DIAGNOSIS — D649 Anemia, unspecified: Secondary | ICD-10-CM | POA: Insufficient documentation

## 2024-01-13 DIAGNOSIS — E7800 Pure hypercholesterolemia, unspecified: Secondary | ICD-10-CM | POA: Diagnosis not present

## 2024-01-13 DIAGNOSIS — I10 Essential (primary) hypertension: Secondary | ICD-10-CM | POA: Diagnosis not present

## 2024-01-13 DIAGNOSIS — R000 Tachycardia, unspecified: Secondary | ICD-10-CM | POA: Diagnosis not present

## 2024-01-13 DIAGNOSIS — I2510 Atherosclerotic heart disease of native coronary artery without angina pectoris: Secondary | ICD-10-CM | POA: Diagnosis not present

## 2024-01-13 DIAGNOSIS — D132 Benign neoplasm of duodenum: Secondary | ICD-10-CM | POA: Diagnosis not present

## 2024-01-13 DIAGNOSIS — Z881 Allergy status to other antibiotic agents status: Secondary | ICD-10-CM | POA: Diagnosis not present

## 2024-01-13 DIAGNOSIS — K219 Gastro-esophageal reflux disease without esophagitis: Secondary | ICD-10-CM | POA: Diagnosis not present

## 2024-01-13 DIAGNOSIS — I499 Cardiac arrhythmia, unspecified: Secondary | ICD-10-CM | POA: Diagnosis not present

## 2024-01-13 DIAGNOSIS — E785 Hyperlipidemia, unspecified: Secondary | ICD-10-CM | POA: Diagnosis present

## 2024-01-13 HISTORY — PX: ESOPHAGOGASTRODUODENOSCOPY: SHX5428

## 2024-01-13 LAB — CBC WITH DIFFERENTIAL/PLATELET
Abs Immature Granulocytes: 0.09 K/uL — ABNORMAL HIGH (ref 0.00–0.07)
Basophils Absolute: 0.1 K/uL (ref 0.0–0.1)
Basophils Relative: 1 %
Eosinophils Absolute: 0.4 K/uL (ref 0.0–0.5)
Eosinophils Relative: 4 %
HCT: 38.2 % — ABNORMAL LOW (ref 39.0–52.0)
Hemoglobin: 12.3 g/dL — ABNORMAL LOW (ref 13.0–17.0)
Immature Granulocytes: 1 %
Lymphocytes Relative: 29 %
Lymphs Abs: 2.7 K/uL (ref 0.7–4.0)
MCH: 30.4 pg (ref 26.0–34.0)
MCHC: 32.2 g/dL (ref 30.0–36.0)
MCV: 94.6 fL (ref 80.0–100.0)
Monocytes Absolute: 0.6 K/uL (ref 0.1–1.0)
Monocytes Relative: 7 %
Neutro Abs: 5.4 K/uL (ref 1.7–7.7)
Neutrophils Relative %: 58 %
Platelets: 249 K/uL (ref 150–400)
RBC: 4.04 MIL/uL — ABNORMAL LOW (ref 4.22–5.81)
RDW: 12.1 % (ref 11.5–15.5)
WBC: 9.2 K/uL (ref 4.0–10.5)
nRBC: 0 % (ref 0.0–0.2)

## 2024-01-13 LAB — TYPE AND SCREEN
ABO/RH(D): O NEG
Antibody Screen: NEGATIVE

## 2024-01-13 LAB — COMPREHENSIVE METABOLIC PANEL WITH GFR
ALT: 21 U/L (ref 0–44)
AST: 21 U/L (ref 15–41)
Albumin: 2.5 g/dL — ABNORMAL LOW (ref 3.5–5.0)
Alkaline Phosphatase: 56 U/L (ref 38–126)
Anion gap: 8 (ref 5–15)
BUN: 27 mg/dL — ABNORMAL HIGH (ref 8–23)
CO2: 25 mmol/L (ref 22–32)
Calcium: 8.6 mg/dL — ABNORMAL LOW (ref 8.9–10.3)
Chloride: 110 mmol/L (ref 98–111)
Creatinine, Ser: 1.32 mg/dL — ABNORMAL HIGH (ref 0.61–1.24)
GFR, Estimated: 52 mL/min — ABNORMAL LOW (ref >60)
Glucose, Bld: 139 mg/dL — ABNORMAL HIGH (ref 70–99)
Potassium: 3.8 mmol/L (ref 3.5–5.1)
Sodium: 143 mmol/L (ref 135–145)
Total Bilirubin: 0.3 mg/dL (ref 0.0–1.2)
Total Protein: 5.1 g/dL — ABNORMAL LOW (ref 6.5–8.1)

## 2024-01-13 LAB — HEMOGLOBIN AND HEMATOCRIT, BLOOD
HCT: 34.6 % — ABNORMAL LOW (ref 39.0–52.0)
HCT: 38.2 % — ABNORMAL LOW (ref 39.0–52.0)
Hemoglobin: 11.4 g/dL — ABNORMAL LOW (ref 13.0–17.0)
Hemoglobin: 12.3 g/dL — ABNORMAL LOW (ref 13.0–17.0)

## 2024-01-13 LAB — ABO/RH: ABO/RH(D): O NEG

## 2024-01-13 LAB — LIPASE, BLOOD: Lipase: 31 U/L (ref 11–51)

## 2024-01-13 LAB — POC OCCULT BLOOD, ED: Fecal Occult Bld: NEGATIVE

## 2024-01-13 SURGERY — EGD (ESOPHAGOGASTRODUODENOSCOPY)
Anesthesia: Monitor Anesthesia Care

## 2024-01-13 MED ORDER — BUPROPION HCL ER (XL) 300 MG PO TB24
300.0000 mg | ORAL_TABLET | Freq: Every day | ORAL | Status: DC
  Administered 2024-01-13 – 2024-01-15 (×4): 300 mg via ORAL
  Filled 2024-01-13 (×3): qty 1

## 2024-01-13 MED ORDER — PHENYLEPHRINE 80 MCG/ML (10ML) SYRINGE FOR IV PUSH (FOR BLOOD PRESSURE SUPPORT)
PREFILLED_SYRINGE | INTRAVENOUS | Status: DC | PRN
  Administered 2024-01-13 (×2): 80 ug via INTRAVENOUS

## 2024-01-13 MED ORDER — SODIUM CHLORIDE 0.9 % IV SOLN: INTRAVENOUS | Status: DC

## 2024-01-13 MED ORDER — LOSARTAN POTASSIUM 50 MG PO TABS
50.0000 mg | ORAL_TABLET | Freq: Every day | ORAL | Status: DC
  Administered 2024-01-13 – 2024-01-15 (×4): 50 mg via ORAL
  Filled 2024-01-13 (×3): qty 1

## 2024-01-13 MED ORDER — ONDANSETRON HCL 4 MG PO TABS: 4.0000 mg | ORAL_TABLET | Freq: Four times a day (QID) | ORAL | Status: DC | PRN

## 2024-01-13 MED ORDER — ONDANSETRON HCL 4 MG/2ML IJ SOLN: 4.0000 mg | Freq: Four times a day (QID) | INTRAMUSCULAR | Status: DC | PRN

## 2024-01-13 MED ORDER — SODIUM CHLORIDE 0.9% FLUSH
3.0000 mL | Freq: Two times a day (BID) | INTRAVENOUS | Status: DC
  Administered 2024-01-13 – 2024-01-15 (×5): 3 mL via INTRAVENOUS

## 2024-01-13 MED ORDER — SERTRALINE HCL 50 MG PO TABS
50.0000 mg | ORAL_TABLET | Freq: Every evening | ORAL | Status: DC
  Administered 2024-01-13 – 2024-01-14 (×2): 50 mg via ORAL
  Filled 2024-01-13 (×2): qty 1

## 2024-01-13 MED ORDER — PROPOFOL 500 MG/50ML IV EMUL
INTRAVENOUS | Status: DC | PRN
  Administered 2024-01-13: 75 ug/kg/min via INTRAVENOUS

## 2024-01-13 MED ORDER — PANTOPRAZOLE SODIUM 40 MG IV SOLR
40.0000 mg | Freq: Two times a day (BID) | INTRAVENOUS | Status: DC
  Administered 2024-01-13: 40 mg via INTRAVENOUS
  Filled 2024-01-13: qty 10

## 2024-01-13 MED ORDER — PANTOPRAZOLE SODIUM 40 MG PO TBEC
40.0000 mg | DELAYED_RELEASE_TABLET | Freq: Two times a day (BID) | ORAL | Status: DC
  Administered 2024-01-13 – 2024-01-15 (×5): 40 mg via ORAL
  Filled 2024-01-13 (×4): qty 1

## 2024-01-13 MED ORDER — ATORVASTATIN CALCIUM 40 MG PO TABS
40.0000 mg | ORAL_TABLET | Freq: Every evening | ORAL | Status: DC
  Administered 2024-01-13 – 2024-01-14 (×2): 40 mg via ORAL
  Filled 2024-01-13 (×2): qty 1

## 2024-01-13 MED ORDER — PHENYLEPHRINE HCL-NACL 20-0.9 MG/250ML-% IV SOLN
INTRAVENOUS | Status: DC | PRN
  Administered 2024-01-13: 50 ug/min via INTRAVENOUS

## 2024-01-13 MED ORDER — PANTOPRAZOLE SODIUM 40 MG IV SOLR
40.0000 mg | Freq: Once | INTRAVENOUS | Status: AC
  Administered 2024-01-13: 40 mg via INTRAVENOUS
  Filled 2024-01-13: qty 10

## 2024-01-13 NOTE — H&P (Signed)
 History and Physical    Peter Gallegos FMW:[id number] DOB: February 24, 1933 DOA: 01/13/2024  PCP: [name], MD  Patient coming from: Home  Chief Complaint: Weakness, hematemesis   HPI: Peter Gallegos is a 89 y.o. male with medical history significant of TIA on Aggrenox  on 2007, hypertension, hyperlipidemia, depression, aortic stenosis, first-degree AV block.  Who presents with a week history of weakness.  Patient has been working with his PCPs office to titrate blood pressure medications.  Recently losartan  was reduced to 50 mg daily and was started on Norvasc 5 mg daily.  He states that he has been feeling weak, then today got lightheaded.  He had a near syncopal episode.  EMS was called by wife.  Once EMS arrived, they found that he was hypotensive with blood pressure 80/50, had episode of vomiting.  Reportedly was hematemesis.  Patient has been having normal bowel movements.  He states that he is on twice daily Aggrenox  treatment.  Last dose was 8/8 PM.  Denies any regular over-the-counter ibuprofen use.  ED Course: Hemoglobin 12.3.  Creatinine 1.32.  Fecal occult was negative.  Type and screen ordered.  GI consulted.  Review of Systems: As per HPI. Otherwise, all other review of systems reviewed and are negative.   Past Medical History:  Diagnosis Date   Anxiety and depression    Depression    Diverticulosis    Family history of coronary artery disease    GERD (gastroesophageal reflux disease)    History of arm fracture    Rt arm, LT elbow fracture   History of colon polyps    History of ETOH abuse    Quit in 1980   Hypercholesterolemia    Hypertension    PONV (postoperative nausea and vomiting)    Severe aortic stenosis    TIA (transient ischemic attack) 12/04/2005    Past Surgical History:  Procedure Laterality Date   COLONOSCOPY     OPEN REDUCTION INTERNAL FIXATION (ORIF) DISTAL PHALANX Left 11/21/2013   Procedure: OPEN REDUCTION INTERNAL FIXATION (ORIF) DISTAL  PHALANX;  Surgeon: [name], MD;  Location: [facility] OR;  Service: Orthopedics;  Laterality: Left;   ORIF WRIST FRACTURE Right 11/25/2020   Procedure: OPEN REDUCTION INTERNAL FIXATION WRIST FRACTURE;  Surgeon: [name], MD;  Location: [facility] OR;  Service: Orthopedics;  Laterality: Right;     reports that he quit smoking about 39 years ago. His smoking use included pipe. He has never used smokeless tobacco. He reports that he does not drink alcohol and does not use drugs.  Allergies  Allergen Reactions   Cephalexin Hives and Itching    Family History  Problem Relation Age of Onset   Heart disease Mother    Heart disease Father        Deceased 47 ( Had MI )    Prior to Admission medications   Medication Sig Start Date End Date Taking? Authorizing Provider  hydrochlorothiazide  (HYDRODIURIL ) 12.5 MG tablet Take 12.5 mg by mouth every morning. 12/01/23  Yes [provider]  alendronate (FOSAMAX) 70 MG tablet Take 70 mg by mouth once a week. 10/20/19   [provider]  atorvastatin  (LIPITOR) 40 MG tablet Take 40 mg by mouth daily. 06/27/19   [provider]  buPROPion  (WELLBUTRIN  XL) 300 MG 24 hr tablet Take 300 mg by mouth daily.    [provider]  CALCIUM  PO Take 1 tablet by mouth daily.    [provider]  Cholecalciferol (VITAMIN D) 125 MCG (  5000 UT) CAPS Take 5,000 Units by mouth daily.    [provider]  dipyridamole -aspirin  (AGGRENOX ) 200-25 MG 12hr capsule Take 1 capsule by mouth 2 (two) times daily.    [provider]  KLOR-CON  10 10 MEQ tablet Take 10 mEq by mouth once. 12/29/23   [provider]  losartan  (COZAAR ) 50 MG tablet Take 50 mg by mouth daily.    [provider]  Multiple Vitamin (MULTIVITAMIN WITH MINERALS) TABS tablet Take 1 tablet by mouth daily.    [provider]  sertraline  (ZOLOFT ) 50 MG tablet Take 50 mg by mouth daily.    [provider]    Physical  Exam: Vitals:   01/13/24 0234 01/13/24 0315 01/13/24 0645 01/13/24 0800  BP: (!) 108/58 (!) 90/57 120/69   Pulse: (!) 59 60 60   Resp: 12 (!) 24 18   Temp: 98 F (36.7 C)   97.8 F (36.6 C)  TempSrc: Oral     SpO2: 92% (!) 87% 91%   Weight: 67.6 kg     Height: 5' 6 (1.676 m)        Constitutional: NAD, calm, comfortable Eyes: PERRL, lids and conjunctivae normal ENMT: Mucous membranes are moist. Normal dentition.  Respiratory: Clear to auscultation bilaterally, no wheezing, no crackles. Normal respiratory effort. No accessory muscle use. No conversational dyspnea  Cardiovascular: Regular rate and rhythm, no murmurs. No extremity edema.  Abdomen: Soft, nondistended, nontender to palpation. Bowel sounds positive.  Musculoskeletal: No joint deformity upper and lower extremities. No contractures. Normal muscle tone.  Skin: no rashes, lesions, ulcers on exposed skin  Neurologic: Alert and oriented, speech fluent, CN 2-12 grossly intact. No focal deficits.   Psychiatric: Normal judgment and insight. Normal mood and affect   Labs on Admission: I have personally reviewed following labs and imaging studies  CBC: Recent Labs  Lab 01/13/24 0242  WBC 9.2  NEUTROABS 5.4  HGB 12.3*  HCT 38.2*  MCV 94.6  PLT 249   Basic Metabolic Panel: Recent Labs  Lab 01/13/24 0242  NA 143  K 3.8  CL 110  CO2 25  GLUCOSE 139*  BUN 27*  CREATININE 1.32*  CALCIUM  8.6*   GFR: Estimated Creatinine Clearance: 34.2 mL/min (A) (by C-G formula based on SCr of 1.32 mg/dL (H)). Liver Function Tests: Recent Labs  Lab 01/13/24 0242  AST 21  ALT 21  ALKPHOS 56  BILITOT 0.3  PROT 5.1*  ALBUMIN 2.5*   Recent Labs  Lab 01/13/24 0242  LIPASE 31   No results for input(s): AMMONIA in the last 168 hours. Coagulation Profile: No results for input(s): INR, PROTIME in the last 168 hours. Cardiac Enzymes: No results for input(s): CKTOTAL, CKMB, CKMBINDEX, TROPONINI in the last 168  hours. BNP (last 3 results) No results for input(s): PROBNP in the last 8760 hours. HbA1C: No results for input(s): HGBA1C in the last 72 hours. CBG: No results for input(s): GLUCAP in the last 168 hours. Lipid Profile: No results for input(s): CHOL, HDL, LDLCALC, TRIG, CHOLHDL, LDLDIRECT in the last 72 hours. Thyroid Function Tests: No results for input(s): TSH, T4TOTAL, FREET4, T3FREE, THYROIDAB in the last 72 hours. Anemia Panel: No results for input(s): VITAMINB12, FOLATE, FERRITIN, TIBC, IRON, RETICCTPCT in the last 72 hours. Urine analysis:    Component Value Date/Time   COLORURINE YELLOW 07/24/2010 1433   APPEARANCEUR CLOUDY (A) 07/24/2010 1433   LABSPEC 1.021 07/24/2010 1433   PHURINE 5.5 07/24/2010 1433   HGBUR SMALL (A) 07/24/2010  1433   BILIRUBINUR NEGATIVE 07/24/2010 1433   KETONESUR NEGATIVE 07/24/2010 1433   PROTEINUR NEGATIVE 07/24/2010 1433   UROBILINOGEN 0.2 07/24/2010 1433   NITRITE NEGATIVE 07/24/2010 1433   LEUKOCYTESUR NEGATIVE 07/24/2010 1433   Sepsis Labs: !!!!!!!!!!!!!!!!!!!!!!!!!!!!!!!!!!!!!!!!!!!! @LABRCNTIP (procalcitonin:4,lacticidven:4) )No results found for this or any previous visit (from the past 240 hours).   Radiological Exams on Admission: DG Chest Port 1 View Result Date: 01/13/2024 CLINICAL DATA:  Heme 10 assess EXAM: PORTABLE CHEST 1 VIEW COMPARISON:  11/22/2020 FINDINGS: Lungs volumes are small, but are symmetric and are clear. No pneumothorax or pleural effusion. Cardiac size within normal limits. Pulmonary vascularity is normal. Osseous structures are age-appropriate. No acute bone abnormality. IMPRESSION: 1. Pulmonary hypoinflation. Electronically Signed   By: Dorethia Molt M.D.   On: 01/13/2024 02:51    EKG: Independently reviewed.  Normal sinus rhythm with prolonged PR interval.  Assessment/Plan Principal Problem:   Hematemesis Active Problems:   Hx of transient ischemic attack (TIA)    Depression   HLD (hyperlipidemia)   1st degree AV block   HTN (hypertension)   Symptomatic anemia   Symptomatic anemia - Near syncopal episode with hematemesis, generalized weakness - Baseline hemoglobin is 14-15.  Presented with hemoglobin 12.3 - Hold Aggrenox  - N.p.o. - IV fluid - IV PPI - GI consulted  CKD stage IIIa - Baseline creatinine is 1.3 - Stable  Holding home medications including HCTZ, Lipitor, Wellbutrin , Cozaar , Zoloft  while n.p.o.   DVT prophylaxis: SCD   Code Status: DNR, confirmed with patient at time of admission Family Communication: None at bedside.  He lives at home with his spouse. Disposition Plan: Home Consults called: GI  Severity of Illness: The appropriate patient status for this patient is INPATIENT. Inpatient status is judged to be reasonable and necessary in order to provide the required intensity of service to ensure the patient's safety. The patient's presenting symptoms, physical exam findings, and initial radiographic and laboratory data in the context of their chronic comorbidities is felt to place them at high risk for further clinical deterioration. Furthermore, it is not anticipated that the patient will be medically stable for discharge from the hospital within 2 midnights of admission.   * I certify that at the point of admission it is my clinical judgment that the patient will require inpatient hospital care spanning beyond 2 midnights from the point of admission due to high intensity of service, high risk for further deterioration and high frequency of surveillance required.DEWAINE Delon Hoe, DO [facility] 01/13/2024, 8:58 AM   Available via Epic secure chat 7am-7pm After these hours, please refer to coverage provider listed on [email or web address]

## 2024-01-13 NOTE — Transfer of Care (Signed)
 Immediate Anesthesia Transfer of Care Note  Patient: Peter Gallegos  Procedure(s) Performed: EGD (ESOPHAGOGASTRODUODENOSCOPY)  Patient Location: PACU and Endoscopy Unit  Anesthesia Type:MAC  Level of Consciousness: drowsy  Airway & Oxygen Therapy: Patient Spontanous Breathing and Patient connected to face mask oxygen  Post-op Assessment: Report given to RN and Post -op Vital signs reviewed and stable  Post vital signs: Reviewed and stable  Last Vitals:  Vitals Value Taken Time  BP    Temp    Pulse    Resp    SpO2      Last Pain:  Vitals:   01/13/24 1200  TempSrc: Temporal  PainSc: 0-No pain         Complications: No notable events documented.

## 2024-01-13 NOTE — ED Notes (Signed)
 Called ccmd to place pt on monitor

## 2024-01-13 NOTE — ED Provider Notes (Signed)
 [place] EMERGENCY DEPARTMENT AT [facility] Provider Note   CSN: [id number] Arrival date & time: 01/13/24  0225     Patient presents with: GI Bleeding   Peter Gallegos is a 89 y.o. male.   The history is provided by the patient and medical records.   89 year old male with history of hypertension, aortic stenosis, GERD, history of alcohol abuse, presenting to the ED with hematemesis.  Patient reports she got up to use the bathroom when he got lightheaded, very nauseated, and nearly passed out.  He began vomiting blood shortly after.  He was hypotensive with EMS to 80/50, he was given 500cc IVF with good improvement of blood pressure.  Patient denies any current abdominal pain, not having any further nausea.  He states bowel movements have been normal without any noted blood.  He is on Aggrenox .  Prior to Admission medications   Medication Sig Start Date End Date Taking? Authorizing Provider  alendronate (FOSAMAX) 70 MG tablet Take 70 mg by mouth once a week. 10/20/19   [provider]  atorvastatin  (LIPITOR) 40 MG tablet Take 40 mg by mouth daily. 06/27/19   [provider]  buPROPion  (WELLBUTRIN  XL) 300 MG 24 hr tablet Take 300 mg by mouth daily.    [provider]  CALCIUM  PO Take 1 tablet by mouth daily.    [provider]  Cholecalciferol (VITAMIN D) 125 MCG (5000 UT) CAPS Take 5,000 Units by mouth daily.    [provider]  dipyridamole -aspirin  (AGGRENOX ) 200-25 MG 12hr capsule Take 1 capsule by mouth 2 (two) times daily.    [provider]  KLOR-CON  10 10 MEQ tablet Take 10 mEq by mouth once. 12/29/23   [provider]  losartan  (COZAAR ) 50 MG tablet Take 50 mg by mouth daily.    [provider]  Multiple Vitamin (MULTIVITAMIN WITH MINERALS) TABS tablet Take 1 tablet by mouth daily.    [provider]  sertraline  (ZOLOFT ) 50 MG tablet Take 50 mg by mouth daily.    [provider]     Allergies: Patient has no known allergies.    Review of Systems  Gastrointestinal:  Positive for nausea and vomiting.  All other systems reviewed and are negative.   Updated Vital Signs BP (!) 90/57   Pulse 60   Temp 98 F (36.7 C) (Oral)   Resp (!) 24   Ht 5' 6 (1.676 m)   Wt 67.6 kg   SpO2 (!) 87%   BMI 24.05 kg/m   Physical Exam Vitals and nursing note reviewed.  Constitutional:      Appearance: He is well-developed.  HENT:     Head: Normocephalic and atraumatic.     Mouth/Throat:     Comments: Dried blood present on right side of beard, oropharynx clear Eyes:     Conjunctiva/sclera: Conjunctivae normal.     Pupils: Pupils are equal, round, and reactive to light.  Cardiovascular:     Rate and Rhythm: Normal rate and regular rhythm.     Heart sounds: Normal heart sounds.  Pulmonary:     Effort: Pulmonary effort is normal. No respiratory distress.     Breath sounds: Normal breath sounds. No rhonchi.  Abdominal:     General: Bowel sounds are normal.     Palpations: Abdomen is soft.     Tenderness: There is no abdominal tenderness. There is no rebound.  Genitourinary:    Comments: Exam chaperoned by wife No hemorrhoids or other  abnormalities seen, no gross blood on DRE, hemoccult neg Musculoskeletal:        General: Normal range of motion.     Cervical back: Normal range of motion.  Skin:    General: Skin is warm and dry.  Neurological:     Mental Status: He is alert and oriented to person, place, and time.     (all labs ordered are listed, but only abnormal results are displayed) Labs Reviewed  CBC WITH DIFFERENTIAL/PLATELET - Abnormal; Notable for the following components:      Result Value   RBC 4.04 (*)    Hemoglobin 12.3 (*)    HCT 38.2 (*)    Abs Immature Granulocytes 0.09 (*)    All other components within normal limits  COMPREHENSIVE METABOLIC PANEL WITH GFR - Abnormal; Notable for the following components:   Glucose, Bld 139 (*)    BUN 27  (*)    Creatinine, Ser 1.32 (*)    Calcium  8.6 (*)    Total Protein 5.1 (*)    Albumin 2.5 (*)    GFR, Estimated 52 (*)    All other components within normal limits  LIPASE, BLOOD  POC OCCULT BLOOD, ED  TYPE AND SCREEN  ABO/RH    EKG: None  Radiology: DG Chest Port 1 View Result Date: 01/13/2024 CLINICAL DATA:  Heme 10 assess EXAM: PORTABLE CHEST 1 VIEW COMPARISON:  11/22/2020 FINDINGS: Lungs volumes are small, but are symmetric and are clear. No pneumothorax or pleural effusion. Cardiac size within normal limits. Pulmonary vascularity is normal. Osseous structures are age-appropriate. No acute bone abnormality. IMPRESSION: 1. Pulmonary hypoinflation. Electronically Signed   By: Dorethia Molt M.D.   On: 01/13/2024 02:51     Procedures   CRITICAL CARE Performed by: Olam CHRISTELLA Slocumb   Total critical care time: 35 minutes  Critical care time was exclusive of separately billable procedures and treating other patients.  Critical care was necessary to treat or prevent imminent or life-threatening deterioration.  Critical care was time spent personally by me on the following activities: development of treatment plan with patient and/or surrogate as well as nursing, discussions with consultants, evaluation of patient's response to treatment, examination of patient, obtaining history from patient or surrogate, ordering and performing treatments and interventions, ordering and review of laboratory studies, ordering and review of radiographic studies, pulse oximetry and re-evaluation of patient's condition.   Medications Ordered in the ED  pantoprazole  (PROTONIX ) injection 40 mg (40 mg Intravenous Given 01/13/24 0249)                                    Medical Decision Making Amount and/or Complexity of Data Reviewed Labs: ordered. Radiology: ordered and independent interpretation performed. ECG/medicine tests: ordered and independent interpretation performed.  Risk Prescription  drug management. Decision regarding hospitalization.   89 year old male presenting to the ED after hematemesis and near syncopal event at home.  States he got up and was trying to go to the bathroom as he felt very nauseated, apparently vomited some blood.  He recalls event of near syncope, denies any head trauma.  Denies any recent bloody stools.  He is awake, alert, oriented here.  He is hemodynamically stable after some IV fluids with EMS.  His abdomen is soft, no peritoneal signs.  Had some dried blood in the right side of his beard but oropharynx is clear.  No active emesis.  He is  currently on Aggrenox .  Denies any prior history of GI bleeding.  Will obtain labs, chest x-ray, start IV Protonix .  Labs as above-- hemoglobin 12.3 (down 2.5g since March 2025).  No electrolyte derangements.  EKG is reassuring.  Hemoccult negative.  He has not had any further emesis.  Recommended admission for possible endoscopy with GI in the morning as he does have history of alcohol abuse in the past (eval for possible varices, etc).  Will continue Protonix  for now.  Patient and wife are agreeable.  Discussed with Dr. Charlton-- will admit for ongoing care.  Secure message sent to on call [facility] (Dr. Abran) for AM consultation.  Final diagnoses:  Hematemesis with nausea    ED Discharge Orders     None          Jarold Olam HERO, PA-C 01/13/24 0545    Jerral Meth, MD 01/13/24 2258

## 2024-01-13 NOTE — Plan of Care (Signed)

## 2024-01-13 NOTE — Anesthesia Preprocedure Evaluation (Addendum)
 Anesthesia Evaluation  Patient identified by MRN, date of birth, ID band Patient awake    Reviewed: Allergy & Precautions, NPO status , Patient's Chart, lab work & pertinent test results  History of Anesthesia Complications (+) PONV and history of anesthetic complications  Airway Mallampati: III  TM Distance: >3 FB Neck ROM: Full   Comment: Large beard Dental  (+) Edentulous Upper, Edentulous Lower   Pulmonary former smoker   Pulmonary exam normal breath sounds clear to auscultation       Cardiovascular hypertension, Pt. on medications Normal cardiovascular exam+ dysrhythmias + Valvular Problems/Murmurs AS  Rhythm:Regular Rate:Normal  Sigmoid septum, no evidence of obstruction while at rest on most recent echo  Normal LV/RV, severe AS.    Neuro/Psych  PSYCHIATRIC DISORDERS Anxiety Depression    TIA   GI/Hepatic ,GERD  ,,(+)     Substance abuse: hx etOH abuse remote past.    Endo/Other  negative endocrine ROS    Renal/GU negative Renal ROS  negative genitourinary   Musculoskeletal   Abdominal   Peds  Hematology  (+) Blood dyscrasia, anemia   Anesthesia Other Findings   Reproductive/Obstetrics negative OB ROS                              Anesthesia Physical Anesthesia Plan  ASA: 4  Anesthesia Plan: MAC   Post-op Pain Management:    Induction:   PONV Risk Score and Plan: 2 and Propofol  infusion and TIVA  Airway Management Planned: Natural Airway and Simple Face Mask  Additional Equipment: None  Intra-op Plan:   Post-operative Plan:   Informed Consent: I have reviewed the patients History and Physical, chart, labs and discussed the procedure including the risks, benefits and alternatives for the proposed anesthesia with the patient or authorized representative who has indicated his/her understanding and acceptance.   Patient has DNR.  Discussed DNR with patient and  Suspend DNR.   Dental advisory given  Plan Discussed with: CRNA  Anesthesia Plan Comments:          Anesthesia Quick Evaluation

## 2024-01-13 NOTE — Consult Note (Signed)
 Consultation Note   Referring Provider:  [facility] PCP: [name], MD Primary Gastroenterologist:    Lupita Commander, MD     Reason for Consultation: Upper GI bleed  DOA: 01/13/2024         Hospital Day: 1   ASSESSMENT    Patient Profile:  89 y.o. year old male with a medical history including but not limited to TIA, subdural hematoma, hypertension, aortic stenosis, 1st degree AV block, GERD, adenomatous colon polyps, Etoh abuse\  # Reported episode of large-volume hematemesis with associated hypotension and possible loss of consciousness this a.m.  Patient was not aware he vomited blood, he describes it as more milky white emesis.  Rule out erosive disease, PUD, neoplasm (less likely).  Mallory-Weiss tear possible but timing of hematemesis seems this would be unlikely as well.   # Acute blood loss anemia Hemoglobin 12.3, down from baseline of 14.7  # History of TIA, on Aggrenox .   Last dose was last night  # Non-rheumatic aortic stenosis.  He declined TAVR earlier this year  # History of colon polyps Multiple small adenomas removed at time of last colonoscopy in 2014.  Due to age, repeat colonoscopy was not recommended   PLAN:   --Keep n.p.o. --Schedule for EGD today.  The risks and benefits of EGD with possible biopsies were discussed with the patient who agrees to proceed.  -- Continue twice daily IV pantoprazole  -- Monitor H&H   HPI   Peter Gallegos was at home early this morning when his wife noticed that he was walking differently.  Details are not clear but she ended up calling EMS.  Per ED notes his blood pressure by EMS was in the 80s.  Patient does tell me that he remembers having an episode of vomiting while EMS was present but did not realize that it was blood as has been reported.  He thought emesis was more milky in color.  Prior to this morning patient states he was in his usual state of health except that  the day prior he had a terrible headache for which he took 2 Aleve.  He does not usually take NSAIDs.  He is on Aggrenox .  He has not had any abdominal pain he has not had any black stools or blood in his stools.  No bowel changes.  Relevant workup thus far: BUN mildly elevated but not out of proportion to a mild elevation in his creatinine.  Albumin 2.6, alk phos normal.  Transaminases normal.   After receiving 500 mL of IV fluids patient's blood pressure has stabilized  Labs and Imaging:  Recent Labs    01/13/24 0242  PROT 5.1*  ALBUMIN 2.5*  AST 21  ALT 21  ALKPHOS 56  BILITOT 0.3   Recent Labs    01/13/24 0242  WBC 9.2  HGB 12.3*  HCT 38.2*  MCV 94.6  PLT 249   Recent Labs    01/13/24 0242  NA 143  K 3.8  CL 110  CO2 25  GLUCOSE 139*  BUN 27*  CREATININE 1.32*  CALCIUM  8.6*     DG Chest Port 1 View CLINICAL DATA:  Heme 10 assess  EXAM: PORTABLE CHEST 1 VIEW  COMPARISON:  11/22/2020  FINDINGS: Lungs volumes are small, but are symmetric and are clear. No pneumothorax or pleural effusion. Cardiac size within normal limits. Pulmonary vascularity is normal. Osseous structures are age-appropriate. No acute bone abnormality.  IMPRESSION: 1. Pulmonary hypoinflation.  Electronically Signed   By: Dorethia Molt M.D.   On: 01/13/2024 02:51    Pertinent GI Studies   Polyp surveillance colonoscopy October 2014 6 sessile polyps measuring 2 to 7 mm in size were removed from the cecum, ascending colon, descending colon and sigmoid colon.  Severe diverticulosis noted in the left colon.  Colonic mucosa otherwise normal with a good prep.  Internal hemorrhoids. Diagnosis 1. Surgical [P], ascending and cecum, polyp (4) - TUBULAR ADENOMAS (X4); NEGATIVE FOR HIGH GRADE DYSPLASIA OR MALIGNANCY. 2. Surgical [P], descending, sigmoid, polyp (2) - TUBULAR ADENOMAS (X2); NEGATIVE FOR HIGH GRADE DYSPLASIA OR MALIGNANCY.  Given age and follow-up colonoscopy was not  recommended  Past Medical History:  Diagnosis Date   Anxiety and depression    Depression    Diverticulosis    Family history of coronary artery disease    GERD (gastroesophageal reflux disease)    History of arm fracture    Rt arm, LT elbow fracture   History of colon polyps    History of ETOH abuse    Quit in 1980   Hypercholesterolemia    Hypertension    PONV (postoperative nausea and vomiting)    Severe aortic stenosis    TIA (transient ischemic attack) 12/04/2005    Past Surgical History:  Procedure Laterality Date   COLONOSCOPY     OPEN REDUCTION INTERNAL FIXATION (ORIF) DISTAL PHALANX Left 11/21/2013   Procedure: OPEN REDUCTION INTERNAL FIXATION (ORIF) DISTAL PHALANX;  Surgeon: [name], MD;  Location: [facility] OR;  Service: Orthopedics;  Laterality: Left;   ORIF WRIST FRACTURE Right 11/25/2020   Procedure: OPEN REDUCTION INTERNAL FIXATION WRIST FRACTURE;  Surgeon: [name], MD;  Location: [facility] OR;  Service: Orthopedics;  Laterality: Right;    Family History  Problem Relation Age of Onset   Heart disease Mother    Heart disease Father        Deceased 19 ( Had MI )    Prior to Admission medications   Medication Sig Start Date End Date Taking? Authorizing Provider  alendronate (FOSAMAX) 70 MG tablet Take 70 mg by mouth once a week. 10/20/19  Yes [provider]  atorvastatin  (LIPITOR) 40 MG tablet Take 40 mg by mouth every evening. 06/27/19  Yes [provider]  buPROPion  (WELLBUTRIN  XL) 300 MG 24 hr tablet Take 300 mg by mouth daily.   Yes [provider]  CALCIUM  PO Take 1 tablet by mouth daily. Take one tablet by mouth daily.   Yes [provider]  Cholecalciferol (VITAMIN D) 125 MCG (5000 UT) CAPS Take 5,000 Units by mouth daily.   Yes [provider]  dipyridamole -aspirin  (AGGRENOX ) 200-25 MG 12hr capsule Take 1 capsule by mouth 2 (two) times daily.   Yes [provider]  losartan  (COZAAR ) 50 MG tablet Take  50 mg by mouth daily.   Yes [provider]  Multiple Vitamin (MULTIVITAMIN WITH MINERALS) TABS tablet Take 1 tablet by mouth daily.   Yes [provider]  potassium chloride  (KLOR-CON ) 10 MEQ tablet Take 10 mEq by mouth every evening. Take one tablet ( ) by mouth once per day.   Yes [provider]  sertraline  (ZOLOFT ) 50 MG tablet Take 50 mg by mouth every evening.   Yes [provider]    Current Facility-Administered Medications  Medication Dose Route Frequency Provider Last Rate Last Admin   0.9 %  sodium chloride  infusion   Intravenous Continuous Rojelio Nest, DO       pantoprazole  (PROTONIX ) injection 40 mg  40 mg Intravenous Q12H Rojelio Nest, DO       Current Outpatient Medications  Medication Sig Dispense Refill   alendronate (FOSAMAX) 70 MG tablet Take 70 mg by mouth once a week.     atorvastatin  (LIPITOR) 40 MG tablet Take 40 mg by mouth every evening.     buPROPion  (WELLBUTRIN  XL) 300 MG 24 hr tablet Take 300 mg by mouth daily.     CALCIUM  PO Take 1 tablet by mouth daily. Take one tablet by mouth daily.     Cholecalciferol (VITAMIN D) 125 MCG (5000 UT) CAPS Take 5,000 Units by mouth daily.     dipyridamole -aspirin  (AGGRENOX ) 200-25 MG 12hr capsule Take 1 capsule by mouth 2 (two) times daily.     losartan  (COZAAR ) 50 MG tablet Take 50 mg by mouth daily.     Multiple Vitamin (MULTIVITAMIN WITH MINERALS) TABS tablet Take 1 tablet by mouth daily.     potassium chloride  (KLOR-CON ) 10 MEQ tablet Take 10 mEq by mouth every evening. Take one tablet ( ) by mouth once per day.     sertraline  (ZOLOFT ) 50 MG tablet Take 50 mg by mouth every evening.      Allergies as of 01/13/2024 - Review Complete 01/13/2024  Allergen Reaction Noted   Cephalexin Hives and Itching 01/13/2024    Social History   Socioeconomic History   Marital status: Married    Spouse name: Not on file   Number of children: 7   Years of education: Not on file    Highest education level: Not on file  Occupational History   Not on file  Tobacco Use   Smoking status: Former    Types: Pipe    Quit date: 1986    Years since quitting: 39.6   Smokeless tobacco: Never   Tobacco comments:    Quit smoking pipe years ago  Psychologist, educational Use   Vaping status: Never Used  Substance and Sexual Activity   Alcohol use: No   Drug use: No   Sexual activity: Not on file  Other Topics Concern   Not on file  Social History Narrative   Not on file   Social Drivers of Health   Financial Resource Strain: Not on file  Food Insecurity: Not on file  Transportation Needs: Not on file  Physical Activity: Not on file  Stress: Not on file  Social Connections: Not on file  Intimate Partner Violence: Not on file     Code Status   Code Status: Limited: Do not attempt resuscitation (DNR) -DNR-LIMITED -Do Not Intubate/DNI   Review of Systems: All systems reviewed and negative except where noted in HPI.  Physical Exam: Vital signs in last 24 hours: Temp:  [97.8 F (36.6 C)-98 F (36.7 C)] 97.8 F (36.6 C) (08/09 0800) Pulse Rate:  [59-60] 60 (08/09 0645) Resp:  [12-24] 18 (08/09 0645) BP: (90-120)/(57-69) 120/69 (08/09 0645) SpO2:  [87 %-92 %] 91 % (08/09 0645) Weight:  [67.6 kg] 67.6 kg (08/09 0234)    General:  Pleasant male in NAD Psych:  Cooperative. Normal mood and affect Eyes: Pupils equal Ears:  Normal auditory acuity Nose: No deformity, discharge or lesions Neck:  Supple, no masses felt Lungs:  Clear to auscultation.  Heart:  Regular rate, regular rhythm.  Loud murmur present Abdomen:  Soft, nondistended, nontender, active bowel sounds, no masses felt Rectal :  Deferred Msk: Symmetrical without gross deformities.  Neurologic:  Alert, oriented, grossly normal neurologically Extremities : No edema Skin:  Intact without significant lesions.    Intake/Output from previous day: No intake/output data recorded. Intake/Output this shift:  No  intake/output data recorded.   Vina Dasen, NP-C   01/13/2024, 9:08 AM

## 2024-01-13 NOTE — ED Triage Notes (Addendum)
 PT BIB [facility] from home. PT got up to use the bathroom  when wife witnessed PT losing consciousness but not hitting his head.When EMS arrived, they witnessed him hyperventilating and vomiting blood. BP on scene was 80/50. 500L of fluid was given and BP improved to 124/52. PT reports no pain and states this is the first time this has happened.PT did arrive with blood visible in his beard. PT has a hx of aortic stenosis, 2nd degree heart block and is on a blood thinner.

## 2024-01-13 NOTE — Op Note (Signed)
 [facility] Patient Name: Peter Gallegos Procedure Date : 01/13/2024 MRN: [id number] Attending MD: Gordy CHRISTELLA Starch , MD, [id number] Date of Birth: November 29, 1932 CSN: [id number] Age: 89 Admit Type: Inpatient Procedure:                Upper GI endoscopy Indications:              Hematemesis Providers:                Gordy CHRISTELLA. Starch, MD, Collene Edu, RN, [place]y [place], Technician Referring MD:             [facility] Medicines:                Monitored Anesthesia Care Complications:            No immediate complications. Estimated Blood Loss:     Estimated blood loss: none. Procedure:                Pre-Anesthesia Assessment:                           - Prior to the procedure, a History and Physical                            was performed, and patient medications and                            allergies were reviewed. The patient's tolerance of                            previous anesthesia was also reviewed. The risks                            and benefits of the procedure and the sedation                            options and risks were discussed with the patient.                            All questions were answered, and informed consent                            was obtained. Prior Anticoagulants: The patient has                            taken dipyridamole , last dose was 1 day prior to                            procedure. ASA Grade Assessment: III - A patient                            with severe systemic disease. After reviewing the  risks and benefits, the patient was deemed in                            satisfactory condition to undergo the procedure.                           After obtaining informed consent, the endoscope was                            passed under direct vision. Throughout the                            procedure, the patient's blood pressure, pulse, and                            oxygen  saturations were monitored continuously. The                            [id number]-[id number] ([id number]) Olympus endoscope was introduced                            through the mouth, and advanced to the second part                            of duodenum. The upper GI endoscopy was                            accomplished without difficulty. The patient                            tolerated the procedure well. Scope In: Scope Out: Findings:      The examined esophagus was normal.      A 2 cm hiatal hernia was present.      One non-bleeding cratered gastric ulcer with no stigmata of bleeding was       found in the gastric antrum. The lesion was 7 mm in largest dimension.       Surrounding severe inflammation and nodularity with granularity and       erythema. This was biopsied with a cold forceps for histology.      A single 18 mm sessile polyp with no bleeding was found in the second       portion of the duodenum. Polypectomy was not attempted due to recent       acute UGI bleeding. Impression:               - Normal esophagus.                           - 2 cm hiatal hernia.                           - Non-acutely bleeding gastric ulcer. Severe                            surrounding gastritis as described above. Biopsied                            (  Jar 1 gastric ulcer; Jar 2 unaffected body and                            antrum for H. Pylori).                           - A single duodenal polyp endoscopically consistent                            with adenoma. Resection not attempted due to recent                            bleeding. Moderate Sedation:      N/A Recommendation:           - Return patient to hospital ward for ongoing care.                           - Soft diet.                           - No aspirin , ibuprofen, naproxen, or other                            non-steroidal anti-inflammatory drugs.                           - Await pathology results.                           - BID  PPI.                           - Closely monitor Hgb/Hct.                           - Repeat upper endoscopy should be considered to                            check healing of gastric ulcer and possibly to                            resect duodenal adenoma (the clinical course of                            this is hard to determine given patient's age). Procedure Code(s):        --- Professional ---                           [phone], Esophagogastroduodenoscopy, flexible,                            transoral; with biopsy, single or multiple Diagnosis Code(s):        --- Professional ---                           K44.9, Diaphragmatic hernia without  obstruction or                            gangrene                           K25.9, Gastric ulcer, unspecified as acute or                            chronic, without hemorrhage or perforation                           K29.70, Gastritis, unspecified, without bleeding                           K31.7, Polyp of stomach and duodenum                           K92.0, Hematemesis CPT copyright 2022 American Medical Association. All rights reserved. The codes documented in this report are preliminary and upon coder review may  be revised to meet current compliance requirements. Gordy CHRISTELLA Starch, MD 01/13/2024 1:24:03 PM This report has been signed electronically. Number of Addenda: 0

## 2024-01-14 DIAGNOSIS — D132 Benign neoplasm of duodenum: Secondary | ICD-10-CM

## 2024-01-14 DIAGNOSIS — K250 Acute gastric ulcer with hemorrhage: Secondary | ICD-10-CM

## 2024-01-14 LAB — BASIC METABOLIC PANEL WITH GFR
Anion gap: 8 (ref 5–15)
BUN: 32 mg/dL — ABNORMAL HIGH (ref 8–23)
CO2: 26 mmol/L (ref 22–32)
Calcium: 8.4 mg/dL — ABNORMAL LOW (ref 8.9–10.3)
Chloride: 110 mmol/L (ref 98–111)
Creatinine, Ser: 1.21 mg/dL (ref 0.61–1.24)
GFR, Estimated: 57 mL/min — ABNORMAL LOW (ref >60)
Glucose, Bld: 99 mg/dL (ref 70–99)
Potassium: 4.2 mmol/L (ref 3.5–5.1)
Sodium: 144 mmol/L (ref 135–145)

## 2024-01-14 LAB — CBC
HCT: 35.6 % — ABNORMAL LOW (ref 39.0–52.0)
Hemoglobin: 11.6 g/dL — ABNORMAL LOW (ref 13.0–17.0)
MCH: 30.5 pg (ref 26.0–34.0)
MCHC: 32.6 g/dL (ref 30.0–36.0)
MCV: 93.7 fL (ref 80.0–100.0)
Platelets: 194 K/uL (ref 150–400)
RBC: 3.8 MIL/uL — ABNORMAL LOW (ref 4.22–5.81)
RDW: 12.4 % (ref 11.5–15.5)
WBC: 7.6 K/uL (ref 4.0–10.5)
nRBC: 0 % (ref 0.0–0.2)

## 2024-01-14 NOTE — Evaluation (Signed)
 Physical Therapy Evaluation Patient Details Name: Peter Gallegos MRN: [id number] DOB: [birth year] Today's Date: 01/14/2024  History of Present Illness  Pt is 89 yo presenting to [facility] via EMS on 8/9 due to near syncopal episode. Pt states he has been feeling weak on working on medications with PCP. S/p endoscopy on 8/9 with findings of gastric ulcer.  PMH includes: anxiety/depression, GERD, HTN, TIA, and R arm fx.  Clinical Impression  Pt is presenting slightly below baseline level of functioning. Currently pt is supervision to CGA for sit to stand and gait with SPC and RW. Pt gait improves significantly with RW vs SPC. Spouse states that pt does not use RW at home but will say he uses it at facility. Pt was educated on the importance of stability with mobility and the purpose of physical therapy to improve balance strength in the hopes of moving away from RW. Pt states understanding. Due to pt current functional status, home set up and available assistance at home recommending skilled physical therapy services 3x/week in order to address strength, balance and functional mobility to decrease risk for falls, injury and re-hospitalization.           If plan is discharge home, recommend the following: Help with stairs or ramp for entrance;A little help with walking and/or transfers;Assistance with cooking/housework;Assist for transportation     Equipment Recommendations None recommended by PT     Functional Status Assessment Patient has had a recent decline in their functional status and demonstrates the ability to make significant improvements in function in a reasonable and predictable amount of time.     Precautions / Restrictions Precautions Precautions: None Recall of Precautions/Restrictions: Intact Restrictions Weight Bearing Restrictions Per Provider Order: No      Mobility  Bed Mobility     General bed mobility comments: sitting in recliner at beginning and end of session     Transfers Overall transfer level: Needs assistance Equipment used: Straight cane Transfers: Sit to/from Stand Sit to Stand: Supervision           General transfer comment: sueprvision for safety with good hand placement for sit to stand with [facility]    Ambulation/Gait Ambulation/Gait assistance: Supervision Gait Distance (Feet): 25 Feet Assistive device: Rolling walker (2 wheels), Straight cane Gait Pattern/deviations: Step-through pattern, Decreased step length - right, Decreased stance time - left, Trunk flexed Gait velocity: decreased Gait velocity interpretation: 1.31 - 2.62 ft/sec, indicative of limited community ambulator   General Gait Details: pt initially walked to door with staright cane and back to recliner with RW. Improve reciprocal gait pattern, step length and balance wiht RW vs SPC. Pt states understanding. Pt reports fatigue after short distance gait.  Stairs Stairs:  (limited due to fatigue today)            Balance Overall balance assessment: Mild deficits observed, not formally tested       Pertinent Vitals/Pain Pain Assessment Pain Assessment: No/denies pain    Home Living Family/patient expects to be discharged to:: Private residence Living Arrangements: Spouse/significant other Available Help at Discharge: Family;Available 24 hours/day Type of Home: House Home Access: Stairs to enter Entrance Stairs-Rails: Can reach both Entrance Stairs-Number of Steps: 3 Alternate Level Stairs-Number of Steps: full flight Home Layout: Two level Home Equipment: Shower seat;Grab bars - tub/shower;Grab bars - toilet;Hand held shower head;Toilet riser;Rollator (4 wheels);Rolling Walker (2 wheels);Cane - single point Additional Comments: Pt  able to sleep on futon on main level. Main level has walk-in shower.  Prior Function Prior Level of Function : Needs assist             Mobility Comments: uses a cane; spouse tries to get him to use RW. Reports 3  falls. Reports his balance is usually why he falls. ADLs Comments: supervision/set-up by spouse     Extremity/Trunk Assessment   Upper Extremity Assessment Upper Extremity Assessment: Defer to OT evaluation    Lower Extremity Assessment Lower Extremity Assessment: Overall WFL for tasks assessed;Generalized weakness    Cervical / Trunk Assessment Cervical / Trunk Assessment: Kyphotic  Communication   Communication Communication: No apparent difficulties    Cognition Arousal: Alert Behavior During Therapy: WFL for tasks assessed/performed   PT - Cognitive impairments: No apparent impairments     Following commands: Intact       Cueing Cueing Techniques: Verbal cues     General Comments General comments (skin integrity, edema, etc.): Spouse present throughout session        Assessment/Plan    PT Assessment Patient needs continued PT services  PT Problem List Decreased strength;Decreased activity tolerance;Decreased balance;Decreased mobility;Decreased safety awareness       PT Treatment Interventions DME instruction;Balance training;Gait training;Stair training;Functional mobility training;Therapeutic activities;Therapeutic exercise;Patient/family education    PT Goals (Current goals can be found in the Care Plan section)  Acute Rehab PT Goals Patient Stated Goal: To not use a walker PT Goal Formulation: With patient/family Time For Goal Achievement: 01/28/24 Potential to Achieve Goals: Good    Frequency Min 2X/week        AM-PAC PT 6 Clicks Mobility  Outcome Measure Help needed turning from your back to your side while in a flat bed without using bedrails?: A Little Help needed moving from lying on your back to sitting on the side of a flat bed without using bedrails?: A Little Help needed moving to and from a bed to a chair (including a wheelchair)?: A Little Help needed standing up from a chair using your arms (e.g., wheelchair or bedside chair)?: A  Little Help needed to walk in hospital room?: A Little Help needed climbing 3-5 steps with a railing? : A Little 6 Click Score: 18    End of Session Equipment Utilized During Treatment: Gait belt Activity Tolerance: Patient tolerated treatment well Patient left: in chair;with call bell/phone within reach;with family/visitor present Nurse Communication: Mobility status PT Visit Diagnosis: Unsteadiness on feet (R26.81);Muscle weakness (generalized) (M62.81);History of falling (Z91.81)    Time: 8389-8363 PT Time Calculation (min) (ACUTE ONLY): 26 min   Charges:   PT Evaluation $PT Eval Low Complexity: 1 Low PT Treatments $Therapeutic Activity: 8-22 mins PT General Charges $$ ACUTE PT VISIT: 1 Visit         Dorothyann Maier, DPT, CLT  Acute Rehabilitation Services Office: [phone] (Secure chat preferred)   Dorothyann VEAR Maier 01/14/2024, 4:45 PM

## 2024-01-14 NOTE — Progress Notes (Signed)
 Daily Progress Note  DOA: 01/13/2024 Hospital Day: 2  Cc: GI bleed  ASSESSMENT    Patient Profile: 89 y.o. year old male with a medical history including but not limited to TIA, subdural hematoma, hypertension, aortic stenosis, 1st degree AV block, GERD, adenomatous colon polyps.  Admitted with upper GI bleed, see GI consult date.8/9  Upper GI bleed / gastric ulcer in setting of Aggrenoxl.  Gastric biopsies pending.  TODAY: feels okay. No further GI bleeding.    # Acute blood loss anemia Hemoglobin overall stable at 11.6  (but down from baseline of 14.7 )   # History of TIA, on Aggrenox .     # Non-rheumatic aortic stenosis.  He declined TAVR earlier this year   # History of colon polyps Multiple small adenomas removed at time of last colonoscopy in 2014.  Due to age, repeat colonoscopy was not recommended   Principal Problem:   Hematemesis Active Problems:   Hx of transient ischemic attack (TIA)   Depression   HLD (hyperlipidemia)   1st degree AV block   HTN (hypertension)   Symptomatic anemia   Acute gastric ulcer with hemorrhage   Duodenal adenoma    PLAN   -Will arrange for outpatient GI follow-up to discuss whether or not to proceed with repeat EGD to document ulcer healing. -Continue twice daily pantoprazole  until time of office visit  Subjective   No complaints. No further GI bleeding.    Objective   GI Studies:   EGD 01/13/24 for GI Bleed Normal esophagus, 2 cm hiatal hernia, not acutely bleeding gastric ulcer with severe surrounding gastritis, biopsied.  A single duodenal polyp endoscopically consistent with adenoma.  Resection not attempted due to recent bleeding.    Recent Labs    01/13/24 0242 01/13/24 1434 01/13/24 1954 01/14/24 0352  WBC 9.2  --   --  7.6  HGB 12.3* 11.4* 12.3* 11.6*  HCT 38.2* 34.6* 38.2* 35.6*  MCV 94.6  --   --  93.7  PLT 249  --   --  194   No results for input(s): FOLATE, VITAMINB12, FERRITIN, TIBC,  IRONPCTSAT in the last 72 hours. Recent Labs    01/13/24 0242 01/14/24 0352  NA 143 144  K 3.8 4.2  CL 110 110  CO2 25 26  GLUCOSE 139* 99  BUN 27* 32*  CREATININE 1.32* 1.21  CALCIUM  8.6* 8.4*   Recent Labs    01/13/24 0242  PROT 5.1*  ALBUMIN 2.5*  AST 21  ALT 21  ALKPHOS 56  BILITOT 0.3      Imaging:  DG Chest Port 1 View CLINICAL DATA:  Heme 10 assess  EXAM: PORTABLE CHEST 1 VIEW  COMPARISON:  11/22/2020  FINDINGS: Lungs volumes are small, but are symmetric and are clear. No pneumothorax or pleural effusion. Cardiac size within normal limits. Pulmonary vascularity is normal. Osseous structures are age-appropriate. No acute bone abnormality.  IMPRESSION: 1. Pulmonary hypoinflation.  Electronically Signed   By: Dorethia Molt M.D.   On: 01/13/2024 02:51     Scheduled inpatient medications:   atorvastatin   40 mg Oral QPM   buPROPion   300 mg Oral Daily   losartan   50 mg Oral Daily   pantoprazole   40 mg Oral BID AC   sertraline   50 mg Oral QPM   sodium chloride  flush  3 mL Intravenous Q12H   Continuous inpatient infusions:   sodium chloride  Stopped (01/13/24 1207)   PRN inpatient medications: ondansetron  **OR** ondansetron  (  ZOFRAN ) IV  Vital signs in last 24 hours: Temp:  [97 F (36.1 C)-98.4 F (36.9 C)] 97.7 F (36.5 C) (08/10 0928) Pulse Rate:  [64-125] 125 (08/10 0928) Resp:  [17-22] 19 (08/10 0928) BP: (105-144)/(56-92) 108/77 (08/10 0928) SpO2:  [91 %-100 %] 95 % (08/10 0928) Weight:  [67.6 kg] 67.6 kg (08/09 1200) Last BM Date : 01/11/24  Intake/Output Summary (Last 24 hours) at 01/14/2024 1114 Last data filed at 01/14/2024 0936 Gross per 24 hour  Intake 829.63 ml  Output 800 ml  Net 29.63 ml    Intake/Output from previous day: 08/09 0701 - 08/10 0700 In: 829.6 [P.O.:240; I.V.:589.6] Out: 600 [Urine:600] Intake/Output this shift: Total I/O In: -  Out: 200 [Urine:200]   Physical Exam:  General: Alert male in  NAD Heart:  Regular rate and rhythm, loud murm.ur orders.  Pulmonary: Normal respiratory effort Abdomen: Soft, nondistended, nontender. Normal bowel sounds. Extremities: No lower extremity edema  Neurologic: Alert and oriented Psych: Pleasant. Cooperative     LOS: 1 day   Vina Dasen ,NP 01/14/2024, 11:14 AM

## 2024-01-14 NOTE — Progress Notes (Addendum)
 PROGRESS NOTE  Peter Gallegos  FMW:[id number] DOB: 20-Jan-1933 DOA: 01/13/2024 PCP: [name], MD  Consultants  Brief Narrative: 89 y.o. male with medical history significant of TIA on Aggrenox  on 2007, hypertension, hyperlipidemia, depression, aortic stenosis, first-degree AV block who presents after syncopal event at home accompanied by hematemesis.  EMS was called by wife.  Once EMS arrived, they found that he was hypotensive with blood pressure 80/50.  Patient has been having normal bowel movements.  He states that he is on twice daily Aggrenox  treatment.  Last dose was 8/8 PM.  Denies any regular over-the-counter ibuprofen use--> wife today tells me she gave him Aleve a couple days ago but no other NSAIDs.  EGD done 8/9 showed ulcer with surrounding areas of gastritis.  Monitoring hemoglobin while in house.   Assessment & Plan: Symptomatic anemia - True syncopal episode based on tachycardia wife today. - Baseline hemoglobin is 14-15.  Presented with hemoglobin 12.3--> 11.6 today. - Holding Aggrenox .  I did discuss via secure chat with Dr. Voncile about long-term antiplatelet usage.  Pt has no documented history of CAD, just aortic stenosis.  Because of this plan will be to hold his antiplatelets as he was on this for TIA that occurred over 20 years ago and he's had no issues since then. - Further plan is to have patient continue to hold antiplatelet as discharge until follow-up with both PCP outpatient and neurology outpatient to discuss need for further antiplatelets. Holding currently in light of episode of bleeding and ulcer as we treat with protonix .    Gastric ulcer/gastritis: -Reason for #1 above. - As above holding Aggrenox . - Continue PPI p.o. twice daily.  Appreciate GI input.   CKD stage IIIa - Baseline creatinine is 1.3 - Stable, will trend  Aortic stenosis: - Follows outpatient with cardiology.  Last echo May of this year showed EF of 60 to 65% - No issues this  admission. - Reviewed cardiology records and no documented history of CAD   Hypertension: - Holding home medications as he was admitted with hypotension. - On HCTZ and Cozaar  outpatient  HLD: - Restart home Lipitor.  Depression: - Will restart home medications Zoloft  and Wellbutrin  - Holding home medications including HCTZ, Lipitor, Wellbutrin , Cozaar , Zoloft  while n.p.o.   DVT prophylaxis:  SCDs Start: 01/13/24 1409 secondary to concern for bleeding with anticoagulation.  Code Status:   Code Status: Limited: Do not attempt resuscitation (DNR) -DNR-LIMITED -Do Not Intubate/DNI  Family Communication: Wife at bedside.  Discussed and all questions answered Level of care: Progressive Status is: Inpatient Dispo: Wife feels comfortable staying in the hospital 1 more night and recheck his hemoglobin in the morning.  If remains stable can discharge at that point.   Consults called: Gastroenterology  Subjective: No complaints today.  No abdominal pain.  No further hematemesis.  No history of melena.  Objective: Vitals:   01/13/24 1940 01/13/24 2304 01/14/24 0313 01/14/24 0928  BP: 129/71 (!) 144/92 125/68 108/77  Pulse: (!) 108 74 71 (!) 125  Resp: 20 20 20 19   Temp: 98.4 F (36.9 C) 98.2 F (36.8 C) 98 F (36.7 C) 97.7 F (36.5 C)  TempSrc: Oral Oral Oral Oral  SpO2: 91% 97% 94% 95%  Weight:      Height:        Intake/Output Summary (Last 24 hours) at 01/14/2024 1337 Last data filed at 01/14/2024 0936 Gross per 24 hour  Intake 429.63 ml  Output 800 ml  Net -370.37 ml  Filed Weights   01/13/24 0234 01/13/24 1200  Weight: 67.6 kg 67.6 kg   Body mass index is 24.05 kg/m.  Gen: 89 y.o. male in no apparent distress.  Nontoxic, pleasant Pulm: Non-labored breathing.  Clear to auscultation bilaterally.  CV: Regular rate and rhythm.  Grade 3/6 murmur noted throughout precordium GI: Abdomen soft, non-tender, non-distended, with normoactive bowel sounds. No organomegaly or  masses felt. Ext: Warm, no deformities, no pedal edema Skin: No rashes, lesions no ulcers Neuro: Alert and oriented. No focal neurological deficits. Psych: Calm  Judgement and insight appear normal. Mood & affect appropriate.     I have personally reviewed the following labs and images: CBC: Recent Labs  Lab 01/13/24 0242 01/13/24 1434 01/13/24 1954 01/14/24 0352  WBC 9.2  --   --  7.6  NEUTROABS 5.4  --   --   --   HGB 12.3* 11.4* 12.3* 11.6*  HCT 38.2* 34.6* 38.2* 35.6*  MCV 94.6  --   --  93.7  PLT 249  --   --  194   BMP &GFR Recent Labs  Lab 01/13/24 0242 01/14/24 0352  NA 143 144  K 3.8 4.2  CL 110 110  CO2 25 26  GLUCOSE 139* 99  BUN 27* 32*  CREATININE 1.32* 1.21  CALCIUM  8.6* 8.4*   Estimated Creatinine Clearance: 37.3 mL/min (by C-G formula based on SCr of 1.21 mg/dL). Liver & Pancreas: Recent Labs  Lab 01/13/24 0242  AST 21  ALT 21  ALKPHOS 56  BILITOT 0.3  PROT 5.1*  ALBUMIN 2.5*   Recent Labs  Lab 01/13/24 0242  LIPASE 31   No results for input(s): AMMONIA in the last 168 hours. Diabetic: No results for input(s): HGBA1C in the last 72 hours. No results for input(s): GLUCAP in the last 168 hours. Cardiac Enzymes: No results for input(s): CKTOTAL, CKMB, CKMBINDEX, TROPONINI in the last 168 hours. No results for input(s): PROBNP in the last 8760 hours. Coagulation Profile: No results for input(s): INR, PROTIME in the last 168 hours. Thyroid Function Tests: No results for input(s): TSH, T4TOTAL, FREET4, T3FREE, THYROIDAB in the last 72 hours. Lipid Profile: No results for input(s): CHOL, HDL, LDLCALC, TRIG, CHOLHDL, LDLDIRECT in the last 72 hours. Anemia Panel: No results for input(s): VITAMINB12, FOLATE, FERRITIN, TIBC, IRON, RETICCTPCT in the last 72 hours. Urine analysis:    Component Value Date/Time   COLORURINE YELLOW 07/24/2010 1433   APPEARANCEUR CLOUDY (A) 07/24/2010 1433    LABSPEC 1.021 07/24/2010 1433   PHURINE 5.5 07/24/2010 1433   HGBUR SMALL (A) 07/24/2010 1433   BILIRUBINUR NEGATIVE 07/24/2010 1433   KETONESUR NEGATIVE 07/24/2010 1433   PROTEINUR NEGATIVE 07/24/2010 1433   UROBILINOGEN 0.2 07/24/2010 1433   NITRITE NEGATIVE 07/24/2010 1433   LEUKOCYTESUR NEGATIVE 07/24/2010 1433   Sepsis Labs: Invalid input(s): PROCALCITONIN, LACTICIDVEN  Microbiology: No results found for this or any previous visit (from the past 240 hours).  Radiology Studies: No results found.  Scheduled Meds:  atorvastatin   40 mg Oral QPM   buPROPion   300 mg Oral Daily   losartan   50 mg Oral Daily   pantoprazole   40 mg Oral BID AC   sertraline   50 mg Oral QPM   sodium chloride  flush  3 mL Intravenous Q12H   Continuous Infusions:  sodium chloride  Stopped (01/13/24 1207)     LOS: 1 day   35 minutes with more than 50% spent in reviewing records, counseling patient/family and coordinating care.  Reyes DEL  Elpidio, MD [facility] [email or web address] 01/14/2024, 1:37 PM

## 2024-01-15 ENCOUNTER — Other Ambulatory Visit (HOSPITAL_COMMUNITY): Payer: Self-pay

## 2024-01-15 DIAGNOSIS — K920 Hematemesis: Secondary | ICD-10-CM | POA: Diagnosis not present

## 2024-01-15 LAB — BASIC METABOLIC PANEL WITH GFR
Anion gap: 6 (ref 5–15)
BUN: 22 mg/dL (ref 8–23)
CO2: 26 mmol/L (ref 22–32)
Calcium: 8.3 mg/dL — ABNORMAL LOW (ref 8.9–10.3)
Chloride: 110 mmol/L (ref 98–111)
Creatinine, Ser: 1.17 mg/dL (ref 0.61–1.24)
GFR, Estimated: 60 mL/min — ABNORMAL LOW (ref >60)
Glucose, Bld: 98 mg/dL (ref 70–99)
Potassium: 3.6 mmol/L (ref 3.5–5.1)
Sodium: 142 mmol/L (ref 135–145)

## 2024-01-15 LAB — CBC
HCT: 32.8 % — ABNORMAL LOW (ref 39.0–52.0)
Hemoglobin: 10.9 g/dL — ABNORMAL LOW (ref 13.0–17.0)
MCH: 30.9 pg (ref 26.0–34.0)
MCHC: 33.2 g/dL (ref 30.0–36.0)
MCV: 92.9 fL (ref 80.0–100.0)
Platelets: 193 K/uL (ref 150–400)
RBC: 3.53 MIL/uL — ABNORMAL LOW (ref 4.22–5.81)
RDW: 12.6 % (ref 11.5–15.5)
WBC: 6.9 K/uL (ref 4.0–10.5)
nRBC: 0 % (ref 0.0–0.2)

## 2024-01-15 MED ORDER — PANTOPRAZOLE SODIUM 40 MG PO TBEC
40.0000 mg | DELAYED_RELEASE_TABLET | Freq: Two times a day (BID) | ORAL | 0 refills | Status: DC
  Filled 2024-01-15: qty 180, 90d supply, fill #0

## 2024-01-15 NOTE — Anesthesia Postprocedure Evaluation (Signed)
 Anesthesia Post Note  Patient: Peter Gallegos  Procedure(s) Performed: EGD (ESOPHAGOGASTRODUODENOSCOPY)     Patient location during evaluation: PACU Anesthesia Type: MAC Level of consciousness: awake and alert Pain management: pain level controlled Vital Signs Assessment: post-procedure vital signs reviewed and stable Respiratory status: spontaneous breathing, nonlabored ventilation, respiratory function stable and patient connected to nasal cannula oxygen Cardiovascular status: stable and blood pressure returned to baseline Postop Assessment: no apparent nausea or vomiting Anesthetic complications: no   No notable events documented.  Last Vitals:  Vitals:   01/15/24 0813 01/15/24 0832  BP: (!) 135/54 (!) 129/58  Pulse: 60 (!) 59  Resp:    Temp: 36.6 C 36.7 C  SpO2: 95% 98%    Last Pain:  Vitals:   01/15/24 0832  TempSrc: Oral  PainSc:                  [name]

## 2024-01-15 NOTE — Progress Notes (Signed)
 Spoke to pt's SO, Darlene, via telephone regarding discharge.  She will be on her way shortly and will be bringing pt's clothing and shoes.    AM meds given and tele taken off the pt at this time

## 2024-01-15 NOTE — TOC Transition Note (Signed)
 Transition of Care (TOC) - Discharge Note Rayfield Gobble RN, BSN Inpatient Care Management Unit 4E- RN Case Manager See Treatment Team for direct phone #   Patient Details  Name: Peter Gallegos MRN: [id number] Date of Birth: [birth year]  Transition of Care [facility]) CM/SW Contact:  Gobble Rayfield Hurst, RN Phone Number: 01/15/2024, 9:55 AM   Clinical Narrative:    Pt stable for transition home today, note order for HHPT, pt discharged prior to CM being able to see pt to discuss HH.   Choice offered verbally by bedside RN- per wife they have used [facility] in past and would like to use them again.   Call made to [facility] for HHPT referral- liaison has accepted referral and will follow up with pt for scheduling.   Wife transported pt home, no DME needs noted.    Final next level of care: Home w Home Health Services Barriers to Discharge: No Barriers Identified   Patient Goals and CMS Choice Patient states their goals for this hospitalization and ongoing recovery are:: return home CMS Medicare.gov Compare Post Acute Care list provided to:: Patient Choice offered to / list presented to : Patient, Spouse      Discharge Placement               Home w/ [facility]        Discharge Plan and Services Additional resources added to the After Visit Summary for     Discharge Planning Services: CM Consult Post Acute Care Choice: Home Health          DME Arranged: N/A DME Agency: NA       HH Arranged: PT HH Agency: [facility] Date [facility] Agency Contacted: 01/15/24 Time HH Agency Contacted: [phone] Representative spoke with at [facility] Agency: Darleene  Social Drivers of Health (SDOH) Interventions SDOH Screenings   Food Insecurity: No Food Insecurity (01/13/2024)  Housing: Low Risk  (01/13/2024)  Transportation Needs: Unmet Transportation Needs (01/13/2024)  Utilities: Not At Risk (01/13/2024)  Social Connections: Unknown (01/13/2024)  Tobacco Use: Medium Risk (01/13/2024)      Readmission Risk Interventions    01/15/2024    9:55 AM  Readmission Risk Prevention Plan  Post Dischage Appt Complete  Medication Screening Complete  Transportation Screening Complete

## 2024-01-15 NOTE — Discharge Summary (Signed)
 Physician Discharge Summary  Arne Schlender FMW:[id number] DOB: Jun 27, 1932 DOA: 01/13/2024  PCP: [name], MD  Admit date: 01/13/2024 Discharge date: 01/15/2024  Admitted From: home Disposition:  home  Recommendations for Outpatient Follow-up:  Follow up with PCP in 1-2 weeks Please obtain BMP/CBC in one week Please follow up on the following pending results: EGD biopsy   Home Health: PT Equipment/Devices: walker  Discharge Condition: stable CODE STATUS: DNR Diet Orders (From admission, onward)     Start     Ordered   01/13/24 1417  DIET SOFT Room service appropriate? Yes; Fluid consistency: Thin  Diet effective now       Question Answer Comment  Room service appropriate? Yes   Fluid consistency: Thin      01/13/24 1416            Brief Narrative: 89 y.o. male with medical history significant of TIA on Aggrenox  on 2007, hypertension, hyperlipidemia, depression, aortic stenosis, first-degree AV block who presents after syncopal event at home accompanied by hematemesis.  EMS was called by wife.  Once EMS arrived, they found that he was hypotensive with blood pressure 80/50.  Patient has been having normal bowel movements.  He states that he is on twice daily Aggrenox  treatment.  Last dose was 8/8 PM.  Denies any regular over-the-counter ibuprofen use--> wife today tells me she gave him Aleve a couple days ago but no other NSAIDs.  EGD done 8/9 showed ulcer with surrounding areas of gastritis.  Monitoring hemoglobin while in house.  Hospital Course / Discharge diagnoses: Principal Problem:   Hematemesis Active Problems:   Hx of transient ischemic attack (TIA)   Depression   HLD (hyperlipidemia)   1st degree AV block   HTN (hypertension)   Symptomatic anemia   Acute gastric ulcer with hemorrhage   Duodenal adenoma  Principal problem Symptomatic anemia - Patient was admitted to the hospital with symptomatic anemia, and a drop from a baseline of 14-15 to 11.  Holding Aggrenox .  Dr. Elpidio, prior hospitalist, did discuss via secure chat with Dr. Voncile about long-term antiplatelet usage.  Pt has no documented history of CAD, just aortic stenosis.  Because of this plan will be to hold his antiplatelets as he was on this for TIA that occurred over 20 years ago and he's had no issues since then.  Gastroenterology consulted and followed patient while hospitalized, underwent an endoscopy on 8/9 which showed PUD.  He was placed on a PPI.  He is better, tolerating a regular diet, hemoglobin is overall stable and will be discharged home in stable condition  Active problems Gastric ulcer/gastritis -Reason for #1 above. As above holding Aggrenox . Continue PPI p.o. twice daily.  CKD stage IIIa - Baseline creatinine is 1.3. Stable Aortic stenosis - Follows outpatient with cardiology.  Last echo May of this year showed EF of 60 to 65%. No issues this admission. Hypertension - continue home meds HLD - Restart home Lipitor. Depression -continue home medications   Sepsis ruled out   Discharge Instructions   Allergies as of 01/15/2024       Reactions   Cephalexin Hives, Itching        Medication List     STOP taking these medications    dipyridamole -aspirin  200-25 MG 12hr capsule Commonly known as: AGGRENOX        TAKE these medications    alendronate 70 MG tablet Commonly known as: FOSAMAX Take 70 mg by mouth once a week.   atorvastatin  40  MG tablet Commonly known as: LIPITOR Take 40 mg by mouth every evening.   buPROPion  300 MG 24 hr tablet Commonly known as: WELLBUTRIN  XL Take 300 mg by mouth daily.   CALCIUM  PO Take 1 tablet by mouth daily. Take one tablet by mouth daily.   losartan  50 MG tablet Commonly known as: COZAAR  Take 50 mg by mouth daily.   multivitamin with minerals Tabs tablet Take 1 tablet by mouth daily.   pantoprazole  40 MG tablet Commonly known as: PROTONIX  Take 1 tablet (40 mg total) by mouth 2 (two) times daily  before a meal.   potassium chloride  10 MEQ tablet Commonly known as: KLOR-CON  Take 10 mEq by mouth every evening. Take one tablet ( ) by mouth once per day.   sertraline  50 MG tablet Commonly known as: ZOLOFT  Take 50 mg by mouth every evening.   Vitamin D 125 MCG (5000 UT) Caps Take 5,000 Units by mouth daily.         Consultations: GI  Procedures/Studies: EGD  DG Chest Port 1 View Result Date: 01/13/2024 CLINICAL DATA:  Heme 10 assess EXAM: PORTABLE CHEST 1 VIEW COMPARISON:  11/22/2020 FINDINGS: Lungs volumes are small, but are symmetric and are clear. No pneumothorax or pleural effusion. Cardiac size within normal limits. Pulmonary vascularity is normal. Osseous structures are age-appropriate. No acute bone abnormality. IMPRESSION: 1. Pulmonary hypoinflation. Electronically Signed   By: Dorethia Molt M.D.   On: 01/13/2024 02:51     Subjective: - no chest pain, shortness of breath, no abdominal pain, nausea or vomiting.   Discharge Exam: BP 128/68 (BP Location: Right Arm)   Pulse (!) 56   Temp 98.1 F (36.7 C) (Oral)   Resp 20   Ht 5' 6 (1.676 m)   Wt 65.4 kg   SpO2 95%   BMI 23.27 kg/m   General: Pt is alert, awake, not in acute distress Cardiovascular: RRR, S1/S2 +, no rubs, no gallops Respiratory: CTA bilaterally, no wheezing, no rhonchi Abdominal: Soft, NT, ND, bowel sounds + Extremities: no edema, no cyanosis    The results of significant diagnostics from this hospitalization (including imaging, microbiology, ancillary and laboratory) are listed below for reference.     Microbiology: No results found for this or any previous visit (from the past 240 hours).   Labs: Basic Metabolic Panel: Recent Labs  Lab 01/13/24 0242 01/14/24 0352 01/15/24 0453  NA 143 144 142  K 3.8 4.2 3.6  CL 110 110 110  CO2 25 26 26   GLUCOSE 139* 99 98  BUN 27* 32* 22  CREATININE 1.32* 1.21 1.17  CALCIUM  8.6* 8.4* 8.3*   Liver Function Tests: Recent Labs   Lab 01/13/24 0242  AST 21  ALT 21  ALKPHOS 56  BILITOT 0.3  PROT 5.1*  ALBUMIN 2.5*   CBC: Recent Labs  Lab 01/13/24 0242 01/13/24 1434 01/13/24 1954 01/14/24 0352 01/15/24 0453  WBC 9.2  --   --  7.6 6.9  NEUTROABS 5.4  --   --   --   --   HGB 12.3* 11.4* 12.3* 11.6* 10.9*  HCT 38.2* 34.6* 38.2* 35.6* 32.8*  MCV 94.6  --   --  93.7 92.9  PLT 249  --   --  194 193   CBG: No results for input(s): GLUCAP in the last 168 hours. Hgb A1c No results for input(s): HGBA1C in the last 72 hours. Lipid Profile No results for input(s): CHOL, HDL, LDLCALC, TRIG, CHOLHDL, LDLDIRECT in the last 72  hours. Thyroid function studies No results for input(s): TSH, T4TOTAL, T3FREE, THYROIDAB in the last 72 hours.  Invalid input(s): FREET3 Urinalysis    Component Value Date/Time   COLORURINE YELLOW 07/24/2010 1433   APPEARANCEUR CLOUDY (A) 07/24/2010 1433   LABSPEC 1.021 07/24/2010 1433   PHURINE 5.5 07/24/2010 1433   HGBUR SMALL (A) 07/24/2010 1433   BILIRUBINUR NEGATIVE 07/24/2010 1433   KETONESUR NEGATIVE 07/24/2010 1433   PROTEINUR NEGATIVE 07/24/2010 1433   UROBILINOGEN 0.2 07/24/2010 1433   NITRITE NEGATIVE 07/24/2010 1433   LEUKOCYTESUR NEGATIVE 07/24/2010 1433    FURTHER DISCHARGE INSTRUCTIONS:   Get Medicines reviewed and adjusted: Please take all your medications with you for your next visit with your Primary MD   Laboratory/radiological data: Please request your Primary MD to go over all hospital tests and procedure/radiological results at the follow up, please ask your Primary MD to get all Hospital records sent to his/her office.   In some cases, they will be blood work, cultures and biopsy results pending at the time of your discharge. Please request that your primary care M.D. goes through all the records of your hospital data and follows up on these results.   Also Note the following: If you experience worsening of your admission  symptoms, develop shortness of breath, life threatening emergency, suicidal or homicidal thoughts you must seek medical attention immediately by calling 911 or calling your MD immediately  if symptoms less severe.   You must read complete instructions/literature along with all the possible adverse reactions/side effects for all the Medicines you take and that have been prescribed to you. Take any new Medicines after you have completely understood and accpet all the possible adverse reactions/side effects.    Do not drive when taking Pain medications or sleeping medications (Benzodaizepines)   Do not take more than prescribed Pain, Sleep and Anxiety Medications. It is not advisable to combine anxiety,sleep and pain medications without talking with your primary care practitioner   Special Instructions: If you have smoked or chewed Tobacco  in the last 2 yrs please stop smoking, stop any regular Alcohol  and or any Recreational drug use.   Wear Seat belts while driving.   Please note: You were cared for by a hospitalist during your hospital stay. Once you are discharged, your primary care physician will handle any further medical issues. Please note that NO REFILLS for any discharge medications will be authorized once you are discharged, as it is imperative that you return to your primary care physician (or establish a relationship with a primary care physician if you do not have one) for your post hospital discharge needs so that they can reassess your need for medications and monitor your lab values.  Time coordinating discharge: 35 minutes  SIGNED:  Nilda Fendt, MD, PhD 01/15/2024, 7:36 AM

## 2024-01-16 ENCOUNTER — Telehealth: Payer: Self-pay | Admitting: Internal Medicine

## 2024-01-16 ENCOUNTER — Encounter (HOSPITAL_COMMUNITY): Payer: Self-pay | Admitting: Internal Medicine

## 2024-01-16 LAB — SURGICAL PATHOLOGY

## 2024-01-16 NOTE — Telephone Encounter (Signed)
 Patient wife called and stated that her husband was Admitted to the hospital with a GI bleed and Dr. Albertus was the one who did a EGD. Patient wife is stating that her husband hemoglobin dropped a whole point and was not notified of this until she read the after visit summary. Patient wife is wanting to schedule a follow up appointment with Dr. Albertus or Dr. Avram for something this month. Patient wife is requesting a call back at [phone] . Please advise.

## 2024-01-16 NOTE — Telephone Encounter (Signed)
 Pt wife Monta stated that she was concerned over the drop in the pt hemoglobin and pt being discharged from hospital.  Pt has no GI symptoms currently. Pt wife did state that the pt is just weak.  Darlene requesting follow up office visit.  Pt was scheduled to see Delon Failing PA on 01/26/2024 at 2:30. Darlene made aware. Darlene  verbalized understanding with all questions answered.

## 2024-01-18 DIAGNOSIS — D649 Anemia, unspecified: Secondary | ICD-10-CM | POA: Diagnosis not present

## 2024-01-18 DIAGNOSIS — E876 Hypokalemia: Secondary | ICD-10-CM | POA: Diagnosis not present

## 2024-01-22 ENCOUNTER — Ambulatory Visit: Payer: Self-pay | Admitting: Internal Medicine

## 2024-01-22 DIAGNOSIS — I10 Essential (primary) hypertension: Secondary | ICD-10-CM | POA: Diagnosis not present

## 2024-01-22 DIAGNOSIS — K250 Acute gastric ulcer with hemorrhage: Secondary | ICD-10-CM | POA: Diagnosis not present

## 2024-01-22 DIAGNOSIS — I350 Nonrheumatic aortic (valve) stenosis: Secondary | ICD-10-CM | POA: Diagnosis not present

## 2024-01-22 DIAGNOSIS — D500 Iron deficiency anemia secondary to blood loss (chronic): Secondary | ICD-10-CM | POA: Diagnosis not present

## 2024-01-22 DIAGNOSIS — M818 Other osteoporosis without current pathological fracture: Secondary | ICD-10-CM | POA: Diagnosis not present

## 2024-01-22 DIAGNOSIS — Z8673 Personal history of transient ischemic attack (TIA), and cerebral infarction without residual deficits: Secondary | ICD-10-CM | POA: Diagnosis not present

## 2024-01-22 DIAGNOSIS — R7989 Other specified abnormal findings of blood chemistry: Secondary | ICD-10-CM | POA: Diagnosis not present

## 2024-01-25 ENCOUNTER — Ambulatory Visit: Admitting: Diagnostic Neuroimaging

## 2024-01-25 ENCOUNTER — Encounter: Payer: Self-pay | Admitting: Diagnostic Neuroimaging

## 2024-01-25 VITALS — BP 132/84 | HR 75 | Ht 66.0 in | Wt 148.0 lb

## 2024-01-25 DIAGNOSIS — G459 Transient cerebral ischemic attack, unspecified: Secondary | ICD-10-CM | POA: Diagnosis not present

## 2024-01-25 NOTE — Progress Notes (Signed)
 [facility]  PATIENT: Peter Gallegos DOB: Aug 31, 1932  REFERRING CLINICIAN: [name], MD HISTORY FROM: patient and wife REASON FOR VISIT: new consult   HISTORICAL  CHIEF COMPLAINT:  Chief Complaint  Patient presents with   New Patient (Initial Visit)    Pt was recently dc for a acute GI bleed and vasovagal episode. Was on aggrenox  and they stopped this because of the bleeding. They were asking about whether would be able to restart a antiplatelet for stroke prevention like asa.    HISTORY OF PRESENT ILLNESS:   89 year old male here for evaluation of TIA.  History of TIA in 2007 with transient aphasia, off-balance feeling, lasting 5 minutes and then resolving.  He went to the hospital for evaluation.  MRI of the brain was unremarkable.  He was diagnosed with TIA and started on Aggrenox .  He is continued that until recently when he was admitted to the hospital for syncope and upper GI bleeding.  Aggrenox  was stopped.  History of hypertension, hyperlipidemia, well-controlled with medical therapy.  No history of stroke or heart attack.   REVIEW OF SYSTEMS: Full 14 system review of systems performed and negative with exception of: As per HPI.  ALLERGIES: Allergies  Allergen Reactions   Cephalexin Hives and Itching    HOME MEDICATIONS: Outpatient Medications Prior to Visit  Medication Sig Dispense Refill   atorvastatin  (LIPITOR) 40 MG tablet Take 40 mg by mouth every evening.     buPROPion  (WELLBUTRIN  XL) 300 MG 24 hr tablet Take 300 mg by mouth daily.     CALCIUM  PO Take 1 tablet by mouth daily. Take one tablet by mouth daily.     Cholecalciferol (VITAMIN D) 125 MCG (5000 UT) CAPS Take 5,000 Units by mouth daily.     ferrous sulfate 325 (65 FE) MG tablet Take 325 mg by mouth every other day.     losartan  (COZAAR ) 50 MG tablet Take 50 mg by mouth daily.     Multiple Vitamin (MULTIVITAMIN WITH MINERALS) TABS tablet Take 1 tablet by mouth daily.      pantoprazole  (PROTONIX ) 40 MG tablet Take 1 tablet (40 mg total) by mouth 2 (two) times daily before a meal. 180 tablet 0   potassium chloride  (KLOR-CON ) 10 MEQ tablet Take 10 mEq by mouth every evening. Take one tablet ( ) by mouth once per day. (Patient taking differently: Take 10 mEq by mouth every other day.)     sertraline  (ZOLOFT ) 50 MG tablet Take 50 mg by mouth every evening.     alendronate (FOSAMAX) 70 MG tablet Take 70 mg by mouth once a week.     No facility-administered medications prior to visit.    PAST MEDICAL HISTORY: Past Medical History:  Diagnosis Date   Anxiety and depression    Depression    Diverticulosis    Family history of coronary artery disease    GERD (gastroesophageal reflux disease)    History of arm fracture    Rt arm, LT elbow fracture   History of colon polyps    History of ETOH abuse    Quit in 1980   Hypercholesterolemia    Hypertension    PONV (postoperative nausea and vomiting)    Severe aortic stenosis    TIA (transient ischemic attack) 12/04/2005    PAST SURGICAL HISTORY: Past Surgical History:  Procedure Laterality Date   COLONOSCOPY     ESOPHAGOGASTRODUODENOSCOPY N/A 01/13/2024   Procedure: EGD (ESOPHAGOGASTRODUODENOSCOPY);  Surgeon: [name], MD;  Location:  [facility];  Service: Gastroenterology;  Laterality: N/A;   OPEN REDUCTION INTERNAL FIXATION (ORIF) DISTAL PHALANX Left 11/21/2013   Procedure: OPEN REDUCTION INTERNAL FIXATION (ORIF) DISTAL PHALANX;  Surgeon: [name], MD;  Location: [facility] OR;  Service: Orthopedics;  Laterality: Left;   ORIF WRIST FRACTURE Right 11/25/2020   Procedure: OPEN REDUCTION INTERNAL FIXATION WRIST FRACTURE;  Surgeon: [name], MD;  Location: [facility] OR;  Service: Orthopedics;  Laterality: Right;    FAMILY HISTORY: Family History  Problem Relation Age of Onset   Heart disease Mother    Heart disease Father        Deceased 3 ( Had MI )    SOCIAL HISTORY: Social History    Socioeconomic History   Marital status: Married    Spouse name: Not on file   Number of children: 7   Years of education: Not on file   Highest education level: Not on file  Occupational History   Not on file  Tobacco Use   Smoking status: Former    Types: Pipe    Quit date: 1986    Years since quitting: 39.6   Smokeless tobacco: Never   Tobacco comments:    Quit smoking pipe years ago  Vaping Use   Vaping status: Never Used  Substance and Sexual Activity   Alcohol use: No   Drug use: No   Sexual activity: Not on file  Other Topics Concern   Not on file  Social History Narrative   Not on file   Social Drivers of Health   Financial Resource Strain: Not on file  Food Insecurity: No Food Insecurity (01/13/2024)   Hunger Vital Sign    Worried About Running Out of Food in the Last Year: Never true    Ran Out of Food in the Last Year: Never true  Transportation Needs: Unmet Transportation Needs (01/13/2024)   PRAPARE - Administrator, Civil Service (Medical): Yes    Lack of Transportation (Non-Medical): No  Physical Activity: Not on file  Stress: Not on file  Social Connections: Unknown (01/13/2024)   Social Connection and Isolation Panel    Frequency of Communication with Friends and Family: Patient unable to answer    Frequency of Social Gatherings with Friends and Family: Patient unable to answer    Attends Religious Services: Patient unable to answer    Active Member of Clubs or Organizations: Patient unable to answer    Attends Banker Meetings: Patient unable to answer    Marital Status: Married  Catering manager Violence: Not At Risk (01/13/2024)   Humiliation, Afraid, Rape, and Kick questionnaire    Fear of Current or Ex-Partner: No    Emotionally Abused: No    Physically Abused: No    Sexually Abused: No     PHYSICAL EXAM  GENERAL EXAM/CONSTITUTIONAL: Vitals:  Vitals:   01/25/24 1450  BP: 132/84  Pulse: 75  Weight: 148 lb (67.1  kg)  Height: 5' 6 (1.676 m)   Body mass index is 23.89 kg/m. Wt Readings from Last 3 Encounters:  01/25/24 148 lb (67.1 kg)  01/15/24 144 lb 3.2 oz (65.4 kg)  01/02/24 149 lb (67.6 kg)   Patient is in no distress; well developed, nourished and groomed; neck is supple  CARDIOVASCULAR: Examination of carotid arteries is normal; no carotid bruits Regular rate and rhythm, SYSTOLIC MURMUR Examination of peripheral vascular system by observation and palpation is normal  EYES: Ophthalmoscopic exam of optic discs and posterior segments  is normal; no papilledema or hemorrhages No results found.  MUSCULOSKELETAL: Gait, strength, tone, movements noted in Neurologic exam below  NEUROLOGIC: MENTAL STATUS:      No data to display         awake, alert, oriented to person, place and time recent and remote memory intact normal attention and concentration language fluent, comprehension intact, naming intact fund of knowledge appropriate  CRANIAL NERVE:  2nd - no papilledema on fundoscopic exam 2nd, 3rd, 4th, 6th - pupils equal and reactive to light, visual fields full to confrontation, extraocular muscles intact, no nystagmus 5th - facial sensation symmetric 7th - facial strength symmetric 8th - hearing intact 9th - palate elevates symmetrically, uvula midline 11th - shoulder shrug symmetric 12th - tongue protrusion midline  MOTOR:  normal bulk and tone, full strength in the BUE, BLE  SENSORY:  normal and symmetric to light touch, temperature, vibration  COORDINATION:  finger-nose-finger, fine finger movements normal  REFLEXES:  deep tendon reflexes 1+ and symmetric  GAIT/STATION:  narrow based gait; USING CANE     DIAGNOSTIC DATA (LABS, IMAGING, TESTING) - I reviewed patient records, labs, notes, testing and imaging myself where available.  Lab Results  Component Value Date   WBC 6.9 01/15/2024   HGB 10.9 (L) 01/15/2024   HCT 32.8 (L) 01/15/2024   MCV 92.9  01/15/2024   PLT 193 01/15/2024      Component Value Date/Time   NA 142 01/15/2024 0453   NA 144 12/09/2022 0828   K 3.6 01/15/2024 0453   CL 110 01/15/2024 0453   CO2 26 01/15/2024 0453   GLUCOSE 98 01/15/2024 0453   BUN 22 01/15/2024 0453   BUN 21 12/09/2022 0828   CREATININE 1.17 01/15/2024 0453   CALCIUM  8.3 (L) 01/15/2024 0453   PROT 5.1 (L) 01/13/2024 0242   ALBUMIN 2.5 (L) 01/13/2024 0242   AST 21 01/13/2024 0242   ALT 21 01/13/2024 0242   ALKPHOS 56 01/13/2024 0242   BILITOT 0.3 01/13/2024 0242   GFRNONAA 60 (L) 01/15/2024 0453   GFRAA >60 07/05/2019 1102   No results found for: CHOL, HDL, LDLCALC, LDLDIRECT, TRIG, CHOLHDL No results found for: YHAJ8R No results found for: VITAMINB12 No results found for: TSH   12/15/05 MRI brain 1.  No evidence of acute or reversible process.  2.  Chronic small vessel disease throughout the brain.  3.  1 cm right parotid cyst.    ASSESSMENT AND PLAN  89 y.o. year old male here with:   Dx:  1. TIA (transient ischemic attack)     PLAN:  HISTORY OF TIA (2007; transient aphasia, off balance x 5 minutes) - for long term stroke prevention, could consider aspirin  81mg  daily if cleared by gastroenterology, although long term benefit of anti-platelet at current age may be less than previously - otherwise continue treatment of BP, lipids; optimize nutrition, exercise, sleep  Return for pending if symptoms worsen or fail to improve, return to PCP.    EDUARD FABIENE HANLON, MD 01/25/2024, 3:27 PM Certified in Neurology, Neurophysiology and Neuroimaging  [facility] [address], [address] [place], [place] [address] [phone]

## 2024-01-25 NOTE — Patient Instructions (Signed)
  HISTORY OF TIA (2007; transient aphasia, off balance x 5 minutes) - for long term stroke prevention, could consider aspirin  81mg  daily if cleared by GI - otherwise continue treatment of BP, lipids; optimize nutrition, exercise, sleep

## 2024-01-26 ENCOUNTER — Encounter: Payer: Self-pay | Admitting: Physician Assistant

## 2024-01-26 ENCOUNTER — Ambulatory Visit: Admitting: Podiatry

## 2024-01-26 ENCOUNTER — Ambulatory Visit: Admitting: Physician Assistant

## 2024-01-26 VITALS — BP 124/80 | HR 72 | Ht 61.5 in | Wt 149.4 lb

## 2024-01-26 DIAGNOSIS — Z8673 Personal history of transient ischemic attack (TIA), and cerebral infarction without residual deficits: Secondary | ICD-10-CM | POA: Diagnosis not present

## 2024-01-26 DIAGNOSIS — Z8719 Personal history of other diseases of the digestive system: Secondary | ICD-10-CM

## 2024-01-26 DIAGNOSIS — D132 Benign neoplasm of duodenum: Secondary | ICD-10-CM

## 2024-01-26 DIAGNOSIS — K250 Acute gastric ulcer with hemorrhage: Secondary | ICD-10-CM

## 2024-01-26 NOTE — Progress Notes (Signed)
 Chief Complaint: Follow-up hospitalization for GI bleed  HPI:    Peter Gallegos is an 89 year old male with a past medical history as listed below including aortic stenosis, GERD, adenomatous colon polyps, remote alcohol use and subdural hematoma, known to Dr. Avram, who presents to clinic today for follow-up after hospitalization for GI bleed.     01/13/2024 patient consulted by our service in the hospital for hematemesis with an associated hemoglobin drop of 2 g.  On Aggrenox .  At that time EGD was performed.    01/13/2024 EGD with 2 cm hiatal hernia, normal esophagus, nonacutely bleeding gastric ulcer with severe surrounding gastritis.  Also a single duodenal polyp consistent with adenoma.  Repeat EGD discussed to consider checking healing of gastric ulcer and possibly to resect duodenal adenoma.  Recommended to twice daily PPI.  Pathology showed antral mucosa with reactive foveolar hyperplasia and changes suggestive of mucosal prolapse as well as unremarkable gastric Koza.  Dr. Albertus recommended opinion from Dr. Wilhelmenia to discuss repeat EGD to check ulcer healing and possibly resect duodenal adenoma.    01/15/2024 CBC with a hemoglobin of 10.9, normal MCV.  BMP normal.    01/15/2024 CBC with a hemoglobin of 12.1.    Today, patient presents to clinic accompanied by his wife.  He is doing well.  He did have maybe 2 or 3 days of residual dark stool after hospitalization, then was started on oral iron and his wife tells me it is somewhat hard for them to tell if he is still bleeding or not, though his recent hemoglobin was reassuring.  Apparently he saw neurology yesterday who wanted to put him on enteric-coated aspirin  81 mg for history of TIA and they are asking if this is okay.  Also continues on iron supplement.  Denies any other acute GI complaints or concerns.    Denies fever, chills or weight loss.    Past Medical History:  Diagnosis Date   Anxiety and depression    Depression    Diverticulosis     Family history of coronary artery disease    GERD (gastroesophageal reflux disease)    History of arm fracture    Rt arm, LT elbow fracture   History of colon polyps    History of ETOH abuse    Quit in 1980   Hypercholesterolemia    Hypertension    PONV (postoperative nausea and vomiting)    Severe aortic stenosis    TIA (transient ischemic attack) 12/04/2005    Past Surgical History:  Procedure Laterality Date   COLONOSCOPY     ESOPHAGOGASTRODUODENOSCOPY N/A 01/13/2024   Procedure: EGD (ESOPHAGOGASTRODUODENOSCOPY);  Surgeon: [name], MD;  Location: [facility] ENDOSCOPY;  Service: Gastroenterology;  Laterality: N/A;   OPEN REDUCTION INTERNAL FIXATION (ORIF) DISTAL PHALANX Left 11/21/2013   Procedure: OPEN REDUCTION INTERNAL FIXATION (ORIF) DISTAL PHALANX;  Surgeon: [name], MD;  Location: [facility] OR;  Service: Orthopedics;  Laterality: Left;   ORIF WRIST FRACTURE Right 11/25/2020   Procedure: OPEN REDUCTION INTERNAL FIXATION WRIST FRACTURE;  Surgeon: [name], MD;  Location: [facility] OR;  Service: Orthopedics;  Laterality: Right;    Current Outpatient Medications  Medication Sig Dispense Refill   atorvastatin  (LIPITOR) 40 MG tablet Take 40 mg by mouth every evening.     buPROPion  (WELLBUTRIN  XL) 300 MG 24 hr tablet Take 300 mg by mouth daily.     CALCIUM  PO Take 1 tablet by mouth daily. Take one tablet by mouth daily.  Cholecalciferol (VITAMIN D) 125 MCG (5000 UT) CAPS Take 5,000 Units by mouth daily.     ferrous sulfate 325 (65 FE) MG tablet Take 325 mg by mouth every other day.     losartan  (COZAAR ) 50 MG tablet Take 50 mg by mouth daily.     Multiple Vitamin (MULTIVITAMIN WITH MINERALS) TABS tablet Take 1 tablet by mouth daily.     pantoprazole  (PROTONIX ) 40 MG tablet Take 1 tablet (40 mg total) by mouth 2 (two) times daily before a meal. 180 tablet 0   potassium chloride  (KLOR-CON ) 10 MEQ tablet Take 10 mEq by mouth every other day.     sertraline  (ZOLOFT ) 50 MG tablet  Take 50 mg by mouth every evening.     No current facility-administered medications for this visit.    Allergies as of 01/26/2024 - Review Complete 01/26/2024  Allergen Reaction Noted   Keflex [cephalexin] Hives, Diarrhea, and Itching 01/13/2024    Family History  Problem Relation Age of Onset   Heart disease Mother    Heart disease Father        Deceased 41 ( Had MI )    Social History   Socioeconomic History   Marital status: Married    Spouse name: Not on file   Number of children: 7   Years of education: Not on file   Highest education level: Not on file  Occupational History   Not on file  Tobacco Use   Smoking status: Former    Types: Pipe    Quit date: 1986    Years since quitting: 39.6   Smokeless tobacco: Never   Tobacco comments:    Quit smoking pipe years ago  Vaping Use   Vaping status: Never Used  Substance and Sexual Activity   Alcohol use: No   Drug use: No   Sexual activity: Not on file  Other Topics Concern   Not on file  Social History Narrative   Not on file   Social Drivers of Health   Financial Resource Strain: Not on file  Food Insecurity: No Food Insecurity (01/13/2024)   Hunger Vital Sign    Worried About Running Out of Food in the Last Year: Never true    Ran Out of Food in the Last Year: Never true  Transportation Needs: Unmet Transportation Needs (01/13/2024)   PRAPARE - Administrator, Civil Service (Medical): Yes    Lack of Transportation (Non-Medical): No  Physical Activity: Not on file  Stress: Not on file  Social Connections: Unknown (01/13/2024)   Social Connection and Isolation Panel    Frequency of Communication with Friends and Family: Patient unable to answer    Frequency of Social Gatherings with Friends and Family: Patient unable to answer    Attends Religious Services: Patient unable to answer    Active Member of Clubs or Organizations: Patient unable to answer    Attends Banker Meetings:  Patient unable to answer    Marital Status: Married  Catering manager Violence: Not At Risk (01/13/2024)   Humiliation, Afraid, Rape, and Kick questionnaire    Fear of Current or Ex-Partner: No    Emotionally Abused: No    Physically Abused: No    Sexually Abused: No    Review of Systems:    Constitutional: No weight loss, fever or chills Cardiovascular: No chest pain Respiratory: No SOB or cough Gastrointestinal: See HPI and otherwise negative   Physical Exam:  Vital signs: BP 124/80 (BP Location:  Left Arm, Patient Position: Sitting, Cuff Size: Normal)   Pulse 72   Ht 5' 1.5 (1.562 m) Comment: height measured without shoes  Wt 149 lb 6 oz (67.8 kg)   BMI 27.77 kg/m    Constitutional:   Pleasant elderly Caucasian male appears to be in NAD, Well developed, Well nourished, alert and cooperative  Respiratory: Respirations even and unlabored. Lungs clear to auscultation bilaterally.   No wheezes, crackles, or rhonchi.  Cardiovascular: Normal S1, S2. No MRG. Regular rate and rhythm. No peripheral edema, cyanosis or pallor.  Gastrointestinal:  Soft, nondistended, nontender. No rebound or guarding. Normal bowel sounds. No appreciable masses or hepatomegaly. Rectal:  Not performed.  Psychiatric: Demonstrates good judgement and reason without abnormal affect or behaviors.  RELEVANT LABS AND IMAGING and see HPI: CBC    Component Value Date/Time   WBC 6.9 01/15/2024 0453   RBC 3.53 (L) 01/15/2024 0453   HGB 10.9 (L) 01/15/2024 0453   HCT 32.8 (L) 01/15/2024 0453   PLT 193 01/15/2024 0453   MCV 92.9 01/15/2024 0453   MCH 30.9 01/15/2024 0453   MCHC 33.2 01/15/2024 0453   RDW 12.6 01/15/2024 0453   LYMPHSABS 2.7 01/13/2024 0242   MONOABS 0.6 01/13/2024 0242   EOSABS 0.4 01/13/2024 0242   BASOSABS 0.1 01/13/2024 0242    CMP     Component Value Date/Time   NA 142 01/15/2024 0453   NA 144 12/09/2022 0828   K 3.6 01/15/2024 0453   CL 110 01/15/2024 0453   CO2 26 01/15/2024  0453   GLUCOSE 98 01/15/2024 0453   BUN 22 01/15/2024 0453   BUN 21 12/09/2022 0828   CREATININE 1.17 01/15/2024 0453   CALCIUM  8.3 (L) 01/15/2024 0453   PROT 5.1 (L) 01/13/2024 0242   ALBUMIN 2.5 (L) 01/13/2024 0242   AST 21 01/13/2024 0242   ALT 21 01/13/2024 0242   ALKPHOS 56 01/13/2024 0242   BILITOT 0.3 01/13/2024 0242   GFRNONAA 60 (L) 01/15/2024 0453   GFRAA >60 07/05/2019 1102    Assessment: 1.  History of upper GI bleed: EGD at time of hospitalization with finding of a duodenal adenoma as well as gastric ulcer thought to be the source of bleeding, patient with increase in hemoglobin since recent hospitalization up to 12.1, no further signs of bleeding, stools are dark with iron supplementation, discussed biopsy results with the patient today 2.  Duodenal adenoma: Seen at time of EGD, per Dr. Albertus discussion she we have with Dr. Wilhelmenia about repeating EGD with possible removal  Plan: 1.  Will let Dr. Avram and Dr. Wilhelmenia review for further discussion about repeat EGD +/- resection/removal of duodenal adenoma.  Told the patient and his wife I will call and let them know when decisions are made. 2.  PCP is doing a good job of monitoring hemoglobin, in fact has repeat labs scheduled for Monday.  No further signs of GI blood loss. 3.  Continue iron supplementation. 4.  Will ask Dr. Avram his thoughts on enteric-coated aspirin  81 mg from neurology given history of TIA 5.  Continue Pantoprazole  40 mg twice daily  Delon Failing, PA-C [facility] 01/26/2024, 2:35 PM  Cc: [name], MD

## 2024-01-29 DIAGNOSIS — I10 Essential (primary) hypertension: Secondary | ICD-10-CM | POA: Diagnosis not present

## 2024-01-31 ENCOUNTER — Telehealth: Payer: Self-pay | Admitting: *Deleted

## 2024-01-31 NOTE — Telephone Encounter (Signed)
-----   Message from Peter Gallegos sent at 01/30/2024  9:34 AM EDT ----- Regarding: FW: assistance please Please relay info regarding asprin-thanks-JLL ----- Message ----- From: Avram Lupita BRAVO, MD Sent: 01/26/2024   5:38 PM EDT To: Peter Hendricks Failing, PA; Aloha Mansourat# Subject: RE: assistance please                          Neurology A/P  HISTORY OF TIA (2007; transient aphasia, off balance x 5 minutes) - for long term stroke prevention, could consider aspirin  81mg  daily if cleared by gastroenterology, although long term benefit of anti-platelet at current age may be less than previously - otherwise continue treatment of BP, lipids; optimize nutrition, exercise, sleep   Return for pending if symptoms worsen or fail to improve, return to PCP.   I would tell patient that if aspirin  is needed/desired  could start after 1 month of treatment of the ulcer - so around 9/9  Does not sound like there is s strong recommendation from the neurologist Patient could review w/ PCP  Will see what Diann thinks about removing the duodenal polyp  CEG ----- Message ----- From: Gallegos Peter Hendricks, PA Sent: 01/26/2024   4:07 PM EDT To: Lupita BRAVO Avram, MD; Aloha Wilhelmenia Raddle., # Subject: assistance please                              Dr. Wilhelmenia,  Dr. Albertus had recommended you review for duodenal adenoma and resection +/- repeat EGD given history of gastric ulcer.  Dr. Avram neurology wants to start enteric-coated aspirin , would this be okay in your opinion, hemoglobin has returned to almost normal no further signs of GI bleed.  Thanks,  Peter Failing, PA-C

## 2024-01-31 NOTE — Telephone Encounter (Signed)
 Patient informed he may start Aspirin  after 02/14/24. Patient voiced understanding.

## 2024-02-06 DIAGNOSIS — M6281 Muscle weakness (generalized): Secondary | ICD-10-CM | POA: Diagnosis not present

## 2024-02-09 DIAGNOSIS — M6281 Muscle weakness (generalized): Secondary | ICD-10-CM | POA: Diagnosis not present

## 2024-02-13 DIAGNOSIS — M6281 Muscle weakness (generalized): Secondary | ICD-10-CM | POA: Diagnosis not present

## 2024-02-14 ENCOUNTER — Ambulatory Visit: Admitting: Podiatry

## 2024-02-14 DIAGNOSIS — B351 Tinea unguium: Secondary | ICD-10-CM | POA: Diagnosis not present

## 2024-02-14 DIAGNOSIS — M79675 Pain in left toe(s): Secondary | ICD-10-CM | POA: Diagnosis not present

## 2024-02-14 DIAGNOSIS — M79674 Pain in right toe(s): Secondary | ICD-10-CM | POA: Diagnosis not present

## 2024-02-14 NOTE — Progress Notes (Signed)
  Subjective:  Patient ID: Peter Gallegos, male    DOB: 01/03/1933,  MRN: [id number]  Chief Complaint  Patient presents with   Nail Problem    RFC both feet   89 y.o. male returns for the above complaint.  Patient presents with thickened elongated dystrophic onychomycosis bilaterally x10.  They are painful to palpation.  Patient states that they are very hard to and painful to ambulate on.  Patient would like to have the repair done.  He has no other acute concerns.  Objective:   There were no vitals filed for this visit.  Podiatric Exam: Vascular: dorsalis pedis and posterior tibial pulses are palpable bilateral. Capillary return is immediate. Temperature gradient is WNL. Skin turgor WNL  Sensorium: Normal Semmes Weinstein monofilament test. Normal tactile sensation bilaterally. Nail Exam: Pt has thick disfigured discolored nails with subungual debris noted bilateral entire nail hallux through fifth toenails.  Pain on palpation to the nails. Ulcer Exam: There is no evidence of ulcer or pre-ulcerative changes or infection. Orthopedic Exam: Muscle tone and strength are WNL. No limitations in general ROM. No crepitus or effusions noted. HAV  B/L.  Hammer toes 2-5  B/L. Skin: Hyperkeratotic lesion noted to bilateral medial aspect of the hallux.  No pinpoint bleeding noted.  Pain on palpation to the lesion. No infection or ulcers.  Dry skin without fissure or pain to the bilateral lower extremity    Assessment & Plan:   1. Pain due to onychomycosis of toenails of both feet          Patient was evaluated and treated and all questions answered.  Xerosis bilateral lower extremity -I explained to the patient the etiology of xerosis and various treatment options were extensively discussed.  I explained to the patient the importance of maintaining moisturization of the skin with application of over-the-counter lotion such as Eucerin or Luciderm.  I have asked the patient to apply this  twice a day.  If unable to resolve patient will benefit from prescription lotion.  Porokeratosis bilateral hallux x2 -I explained to patient the etiology of porokeratosis versus treatment options were discussed.  Given the amount of pain that is having I believe patient will benefit from aggressive debridement of the lesion.  Using chisel blade and handle the lesions were debrided down to healthy striated tissue.  No complication noted.  No pinpoint bleeding noted  Onychomycosis with pain  -Nails palliatively debrided as below. -Educated on self-care  Procedure: Nail Debridement Rationale: pain  Type of Debridement: manual, sharp debridement. Instrumentation: Nail nipper, rotary burr. Number of Nails: 10  Procedures and Treatment: Consent by patient was obtained for treatment procedures. The patient understood the discussion of treatment and procedures well. All questions were answered thoroughly reviewed. Debridement of mycotic and hypertrophic toenails, 1 through 5 bilateral and clearing of subungual debris. No ulceration, no infection noted.  Return Visit-Office Procedure: Patient instructed to return to the office for a follow up visit 3 months for continued evaluation and treatment.  Franky Blanch, DPM    No follow-ups on file.

## 2024-02-15 DIAGNOSIS — M6281 Muscle weakness (generalized): Secondary | ICD-10-CM | POA: Diagnosis not present

## 2024-02-16 ENCOUNTER — Ambulatory Visit: Attending: Cardiovascular Disease | Admitting: Cardiovascular Disease

## 2024-02-16 ENCOUNTER — Encounter: Payer: Self-pay | Admitting: Cardiovascular Disease

## 2024-02-16 VITALS — BP 154/86 | HR 65 | Ht 64.0 in | Wt 152.6 lb

## 2024-02-16 DIAGNOSIS — I350 Nonrheumatic aortic (valve) stenosis: Secondary | ICD-10-CM | POA: Diagnosis not present

## 2024-02-16 DIAGNOSIS — Z01812 Encounter for preprocedural laboratory examination: Secondary | ICD-10-CM | POA: Diagnosis not present

## 2024-02-16 NOTE — Progress Notes (Signed)
 Pre Surgical Assessment: 5 M Walk Test  54M=16.38ft  5 Meter Walk Test- trial 1: 11.98 seconds 5 Meter Walk Test- trial 2: 10.46 seconds 5 Meter Walk Test- trial 3: 11.36 seconds 5 Meter Walk Test Average: 11.3 seconds

## 2024-02-16 NOTE — H&P (View-Only) (Signed)
 Cardiology Office Note:    Date:  02/16/2024   ID:  Peter Gallegos, DOB 01/14/1933, MRN [id number]  PCP:  [name], MD   [facility] Providers Cardiologist:  Madonna Large, DO     Referring MD: [name], MD   Chief Complaint  Patient presents with   Shortness of Breath    History of Present Illness:    Peter Gallegos is a 89 y.o. male presents for follow-up today.   Mr. Zeiss is here again with his wife today.  I initially saw him in February 2025 for TAVR evaluation.  At the time I thought it was appropriate to evaluate him for TAVR as he demonstrated findings consistent with severe paradoxical low-flow low gradient aortic stenosis.  However, he was fearful of proceeding and we recommended continued clinical surveillance after discussing all the treatment options.  In the interim, he was hospitalized in August with hematemesis and vasovagal syncope.  Symptoms resolved and hemoglobin improved after discontinuation of Aggrenox .  He had been on that medication for decades after a remote TIA.  The patient remains functionally independent and lives with his wife in [place].  He ambulates sometimes with a cane and sometimes without any assistance.  He is able to walk around in his yard.  He complains of exertional dyspnea and fatigue with activity.  He denies any chest pain or pressure.  The patient is edentulous.  He had a follow-up echocardiogram Nov 03, 2023 which showed consistent findings of normal LVEF 60 to 65%, moderate asymmetric LVH, mild to moderate mitral regurgitation, and findings consistent with severe paradoxical low-flow low gradient aortic stenosis.  The mean gradient was 17 mmHg but dimensionless index was low at 0.22 with a low stroke-volume index of 26 and the calculated aortic valve area of 0.6 cm.  In the interim, the patient has discussed his situation at length with his wife as well as his primary care physician, and he has decided that he  wants to move forward with TAVR to try to improve his quality of life and longevity.  Current Medications: Current Meds  Medication Sig   atorvastatin  (LIPITOR) 40 MG tablet Take 40 mg by mouth every evening.   buPROPion  (WELLBUTRIN  XL) 300 MG 24 hr tablet Take 300 mg by mouth daily.   CALCIUM  PO Take 1 tablet by mouth daily. Take one tablet by mouth daily.   Cholecalciferol (VITAMIN D) 125 MCG (5000 UT) CAPS Take 5,000 Units by mouth daily.   ferrous sulfate 325 (65 FE) MG tablet Take 325 mg by mouth every other day.   losartan  (COZAAR ) 50 MG tablet Take 50 mg by mouth daily.   Multiple Vitamin (MULTIVITAMIN WITH MINERALS) TABS tablet Take 1 tablet by mouth daily.   pantoprazole  (PROTONIX ) 40 MG tablet Take 1 tablet (40 mg total) by mouth 2 (two) times daily before a meal.   potassium chloride  (KLOR-CON ) 10 MEQ tablet Take 10 mEq by mouth every other day.   sertraline  (ZOLOFT ) 50 MG tablet Take 50 mg by mouth every evening.     Allergies:   Keflex [cephalexin]   ROS:   Please see the history of present illness.    All other systems reviewed and are negative.  EKGs/Labs/Other Studies Reviewed:    The following studies were reviewed today: Cardiac Studies & Procedures   ______________________________________________________________________________________________     ECHOCARDIOGRAM  ECHOCARDIOGRAM COMPLETE 11/03/2023  Narrative ECHOCARDIOGRAM REPORT    Patient Name:   Peter Gallegos Date of  Exam: 11/03/2023 Medical Rec #:  [id number]           Height:       66.0 in Accession #:    [id number]          Weight:       130.0 lb Date of Birth:  10-19-1932           BSA:          1.665 m Patient Age:    89 years            BP:           120/78 mmHg Patient Gender: M                   HR:           60 bpm. Exam Location:  [address]  Procedure: 2D Echo and 3D Echo (Both Spectral and Color Flow Doppler were utilized during procedure).  Indications:    I35.0 Nonrheumatic  aortic (valve) stenosis  History:        Patient has prior history of Echocardiogram examinations, most recent 06/21/2023. TIA, Signs/Symptoms:Fatigue; Risk Factors:Dyslipidemia. 1st degree AV block. Exertional dyspnea. Subarachnoid hematoma.  Sonographer:    Jon Hacker RCS Referring Phys: [phone] Remberto Lienhard  IMPRESSIONS   1. Left ventricular ejection fraction, by estimation, is 60 to 65%. Left ventricular ejection fraction by 3D volume is 61 %. The left ventricle has normal function. The left ventricle has no regional wall motion abnormalities. There is moderate asymmetric left ventricular hypertrophy of the basal-septal segment. Left ventricular diastolic parameters are indeterminate. 2. Right ventricular systolic function was not well visualized. The right ventricular size is not well visualized. Tricuspid regurgitation signal is inadequate for assessing PA pressure. 3. The mitral valve is normal in structure. Mild to moderate mitral valve regurgitation. No evidence of mitral stenosis. 4. The aortic valve is tricuspid. There is severe calcifcation of the aortic valve. Aortic valve regurgitation is mild. Severe aortic valve stenosis. Mild AS by gradients (Vmax 2.6 m/s, MG ), but severe by AVA (0.7cm^2) and DI (0.22). Low SV index (26 cc/m^2), suspect paradoxical low flow low gradient severe AS  FINDINGS Left Ventricle: Left ventricular ejection fraction, by estimation, is 60 to 65%. Left ventricular ejection fraction by 3D volume is 61 %. The left ventricle has normal function. The left ventricle has no regional wall motion abnormalities. The left ventricular internal cavity size was normal in size. There is moderate asymmetric left ventricular hypertrophy of the basal-septal segment. Left ventricular diastolic parameters are indeterminate.  Right Ventricle: The right ventricular size is not well visualized. Right vetricular wall thickness was not well visualized. Right  ventricular systolic function was not well visualized. Tricuspid regurgitation signal is inadequate for assessing PA pressure.  Left Atrium: Left atrial size was normal in size.  Right Atrium: Right atrial size was not well visualized.  Pericardium: There is no evidence of pericardial effusion.  Mitral Valve: The mitral valve is normal in structure. Mild to moderate mitral valve regurgitation. No evidence of mitral valve stenosis.  Tricuspid Valve: The tricuspid valve is normal in structure. Tricuspid valve regurgitation is trivial.  Aortic Valve: The aortic valve is tricuspid. There is severe calcifcation of the aortic valve. Aortic valve regurgitation is mild. Severe aortic stenosis is present. Aortic valve mean gradient measures 17.0 mmHg. Aortic valve peak gradient measures 26.4 mmHg. Aortic valve area, by VTI measures 0.68 cm.  Pulmonic Valve: The pulmonic valve was grossly normal.  Pulmonic valve regurgitation is trivial.  Aorta: The aortic root and ascending aorta are structurally normal, with no evidence of dilitation.  IAS/Shunts: The interatrial septum was not well visualized.  Additional Comments: 3D was performed not requiring image post processing on an independent workstation and was normal.   LEFT VENTRICLE PLAX 2D LVIDd:         4.24 cm         Diastology LVIDs:         2.80 cm         LV e' medial:    5.11 cm/s LV PW:         0.95 cm         LV E/e' medial:  16.5 LV IVS:        1.17 cm         LV e' lateral:   10.60 cm/s LVOT diam:     2.00 cm         LV E/e' lateral: 8.0 LV SV:         43 LV SV Index:   26 LVOT Area:     3.14 cm        3D Volume EF LV 3D EF:    Left ventricul ar ejection fraction by 3D volume is 61 %.  3D Volume EF: 3D EF:        61 % LV EDV:       139 ml LV ESV:       54 ml LV SV:        85 ml  RIGHT VENTRICLE RV S prime:     12.40 cm/s TAPSE (M-mode): 2.5 cm  LEFT ATRIUM           Index LA diam:      5.10 cm 3.06 cm/m LA  Vol (A2C): 43.2 ml 25.94 ml/m LA Vol (A4C): 86.5 ml 51.94 ml/m AORTIC VALVE AV Area (Vmax):    0.66 cm AV Area (Vmean):   0.59 cm AV Area (VTI):     0.68 cm AV Vmax:           257.00 cm/s AV Vmean:          197.000 cm/s AV VTI:            0.636 m AV Peak Grad:      26.4 mmHg AV Mean Grad:      17.0 mmHg LVOT Vmax:         54.40 cm/s LVOT Vmean:        36.800 cm/s LVOT VTI:          0.137 m LVOT/AV VTI ratio: 0.22  AORTA Ao Root diam: 3.10 cm Ao Asc diam:  3.50 cm  MITRAL VALVE MV Area (PHT): 6.37 cm    SHUNTS MV Decel Time: 119 msec    Systemic VTI:  0.14 m MV E velocity: 84.40 cm/s  Systemic Diam: 2.00 cm MV A velocity: 84.40 cm/s MV E/A ratio:  1.00  Lonni Nanas MD Electronically signed by Lonni Nanas MD Signature Date/Time: 11/03/2023/2:49:06 PM    Final    MONITORS  LONG TERM MONITOR (3-14 DAYS) 10/04/2021  Narrative Cardiac monitor (Zio Patch): Patch Wear Time:  6 days and 23 hours Dominant rhythm sinus with first-degree AV block. Heart rate 30-102 bpm.  Avg HR 68 bpm. No atrial fibrillation, supraventricular tachycardia, ventricular tachycardia, high grade AV block, pauses (3 seconds or longer). Minimum HR 30 bpm, asymptomatic, on 09/26/2021, at 11:20 PM, sinus with  second-degree type I AV block. Total ventricular ectopic burden <1%. Total supraventricular ectopic burden 0%. Patient triggered events: 0.       ______________________________________________________________________________________________      EKG:        Recent Labs: 01/13/2024: ALT 21 01/15/2024: BUN 22; Creatinine, Ser 1.17; Hemoglobin 10.9; Platelets 193; Potassium 3.6; Sodium 142  Recent Lipid Panel No results found for: CHOL, TRIG, HDL, CHOLHDL, VLDL, LDLCALC, LDLDIRECT           Physical Exam:    VS:  BP (!) 154/86 (BP Location: Left Arm, Cuff Size: Normal)   Pulse 65   Ht 5' 4 (1.626 m)   Wt 152 lb 9.6 oz (69.2 kg)   SpO2 96%   BMI  26.19 kg/m     Wt Readings from Last 3 Encounters:  02/16/24 152 lb 9.6 oz (69.2 kg)  01/26/24 149 lb 6 oz (67.8 kg)  01/25/24 148 lb (67.1 kg)     GEN: Pleasant elderly male in no acute distress HEENT: Normal NECK: No JVD; bilateral carotid bruits LYMPHATICS: No lymphadenopathy CARDIAC: RRR, 3/6 harsh crescendo decrescendo murmur at the right upper sternal border RESPIRATORY:  Clear to auscultation without rales, wheezing or rhonchi  ABDOMEN: Soft, non-tender, non-distended MUSCULOSKELETAL:  No edema; No deformity  SKIN: Warm and dry NEUROLOGIC:  Alert and oriented x 3 PSYCHIATRIC:  Normal affect   Assessment & Plan Nonrheumatic aortic valve stenosis The patient has severe, symptomatic, stage D3 (paradoxical low-flow low gradient) aortic stenosis.  This is associated with NYHA functional class II symptoms of exertional dyspnea and fatigue.  I have personally reviewed his echo images and confirm the presence of normal LVEF and a severely calcified restricted aortic valve with findings outlined above (mean gradient 17 mmHg, aortic valve area 0.6 cm, dimensionless index 0.22, low stroke-volume index). I have reviewed the natural history of aortic stenosis with the patient and their family members who are present today. We have discussed the limitations of medical therapy and the poor prognosis associated with symptomatic aortic stenosis. We have reviewed potential treatment options, including palliative medical therapy, conventional surgical aortic valve replacement, and transcatheter aortic valve replacement. We discussed treatment options in the context of the patient's specific comorbid medical conditions.  The patient understands that further evaluation is required and I have recommended left heart catheterization/coronary angiography/possible PCI to evaluate for the presence of obstructive CAD. I have reviewed the risks, indications, and alternatives to cardiac catheterization, possible  angioplasty, and stenting with the patient. Risks include but are not limited to bleeding, infection, vascular injury, stroke, myocardial infection, arrhythmia, kidney injury, radiation-related injury in the case of prolonged fluoroscopy use, emergency cardiac surgery, and death. The patient understands the risks of serious complication is 1-2 in 1000 with diagnostic cardiac cath and 1-2% or less with angioplasty/stenting.  In addition, he will require a gated CTA of the heart as well as a CTA of the chest, abdomen, and pelvis to evaluate for TAVR anatomy.  Once his studies are completed, his case will be reviewed by our multidisciplinary heart valve team and he will be referred for formal cardiac surgical consultation as part of a multidisciplinary approach to his care. Pre-operative laboratory examination Check pre-procedure labs today.        Informed Consent   Shared Decision Making/Informed Consent The risks [stroke (1 in 1000), death (1 in 1000), kidney failure [usually temporary] (1 in 500), bleeding (1 in 200), allergic reaction [possibly serious] (1 in 200)], benefits (diagnostic support and  management of coronary artery disease) and alternatives of a cardiac catheterization were discussed in detail with Mr. Rosenzweig and he is willing to proceed.       Medication Adjustments/Labs and Tests Ordered: Current medicines are reviewed at length with the patient today.  Concerns regarding medicines are outlined above.  Orders Placed This Encounter  Procedures   CBC   Basic metabolic panel with GFR   Ambulatory referral to Cardiothoracic Surgery   No orders of the defined types were placed in this encounter.   Patient Instructions  Medication Instructions:  No medication changes were made at this visit. Continue current regimen.   *If you need a refill on your cardiac medications before your next appointment, please call your pharmacy*  Lab Work: To be completed today: BMP and CBC  If  you have labs (blood work) drawn today and your tests are completely normal, you will receive your results only by: MyChart Message (if you have MyChart) OR A paper copy in the mail If you have any lab test that is abnormal or we need to change your treatment, we will call you to review the results.  Testing/Procedures: Your physician has requested that you have a cardiac catheterization on 02/29/24 at 10:30 AM (you will need to arrive by 8:30 AM). Cardiac catheterization is used to diagnose and/or treat various heart conditions. Doctors may recommend this procedure for a number of different reasons. The most common reason is to evaluate chest pain. Chest pain can be a symptom of coronary artery disease (CAD), and cardiac catheterization can show whether plaque is narrowing or blocking your heart's arteries. This procedure is also used to evaluate the valves, as well as measure the blood flow and oxygen levels in different parts of your heart. For further information please visit [email or web address] Please follow instruction sheet, as given.  Our nurse navigator will be contacting you to arrange the CT scan(s) for the TAVR work-up.  Follow-Up: At [facility], you and your health needs are our priority.  As part of our continuing mission to provide you with exceptional heart care, our providers are all part of one team.  This team includes your primary Cardiologist (physician) and Advanced Practice Providers or APPs (Physician Assistants and Nurse Practitioners) who all work together to provide you with the care you need, when you need it.  Your next appointment:   Per Structural Heart Team  Provider:   Ozell Fell, MD or Izetta Hummer, PA-C    We recommend signing up for the patient portal called MyChart.  Sign up information is provided on this After Visit Summary.  MyChart is used to connect with patients for Virtual Visits (Telemedicine).  Patients are able to view lab/test  results, encounter notes, upcoming appointments, etc.  Non-urgent messages can be sent to your provider as well.   To learn more about what you can do with MyChart, go to [email or web address].   Other Instructions  You have been referred to cardiothoracic surgery for an appointment regarding your TAVR work-up. Someone will be in contact with you soon about this appointment.         Cardiac/Peripheral Catheterization   You are scheduled for a Cardiac Catheterization on Thursday, September 25 with Dr. Ozell Fell.  1. Please arrive at the [facility] (Main Entrance A) at [facility]: [address] [place], [place] [address] at 8:30 AM (This time is 2 hour(s) before your procedure to ensure your preparation). Your procedure is scheduled to begin  at 10:30 AM.  Free valet parking service is available. You will check in at ADMITTING. The support person will be asked to wait in the waiting room.  It is OK to have someone drop you off and come back when you are ready to be discharged.        Special note: Every effort is made to have your procedure done on time. Please understand that emergencies sometimes delay scheduled procedures.  2. Diet: Nothing to eat after midnight.  3. Hydration:You need to be well hydrated before your procedure. On September 25, you may drink approved liquids (see below) until 2 hours before the procedure, with 16 oz of water as your last intake.   List of approved liquids water, clear juice, clear tea, black coffee, fruit juices, non-citric and without pulp, carbonated beverages, Gatorade, Kool -Aid, plain Jello-O and plain ice popsicles.  4. Labs: You will need to have blood drawn on Wednesday, September 12 at [facility] (1st Floor), [address] [place], [place] [address]. You do not need to be fasting.  5. Medication instructions in preparation for your procedure:   Contrast Allergy: No   Stop  taking, Cozaar  (Losartan ) Wednesday, September 24,   On the morning of your procedure, take Aspirin  81 mg and any morning medicines NOT listed above.  You may use sips of water.  6. Plan to go home the same day, you will only stay overnight if medically necessary. 7. You MUST have a responsible adult to drive you home. 8. An adult MUST be with you the first 24 hours after you arrive home. 9. Bring a current list of your medications, and the last time and date medication taken. 10. Bring ID and current insurance cards. 11.Please wear clothes that are easy to get on and off and wear slip-on shoes.  Thank you for allowing us  to care for you!   -- [facility] Cardi[facility] services     Signed, Ozell Fell, MD  02/16/2024 11:03 AM    [facility]

## 2024-02-16 NOTE — Assessment & Plan Note (Signed)
 The patient has severe, symptomatic, stage D3 (paradoxical low-flow low gradient) aortic stenosis.  This is associated with NYHA functional class II symptoms of exertional dyspnea and fatigue.  I have personally reviewed his echo images and confirm the presence of normal LVEF and a severely calcified restricted aortic valve with findings outlined above (mean gradient 17 mmHg, aortic valve area 0.6 cm, dimensionless index 0.22, low stroke-volume index). I have reviewed the natural history of aortic stenosis with the patient and their family members who are present today. We have discussed the limitations of medical therapy and the poor prognosis associated with symptomatic aortic stenosis. We have reviewed potential treatment options, including palliative medical therapy, conventional surgical aortic valve replacement, and transcatheter aortic valve replacement. We discussed treatment options in the context of the patient's specific comorbid medical conditions.  The patient understands that further evaluation is required and I have recommended left heart catheterization/coronary angiography/possible PCI to evaluate for the presence of obstructive CAD. I have reviewed the risks, indications, and alternatives to cardiac catheterization, possible angioplasty, and stenting with the patient. Risks include but are not limited to bleeding, infection, vascular injury, stroke, myocardial infection, arrhythmia, kidney injury, radiation-related injury in the case of prolonged fluoroscopy use, emergency cardiac surgery, and death. The patient understands the risks of serious complication is 1-2 in 1000 with diagnostic cardiac cath and 1-2% or less with angioplasty/stenting.  In addition, he will require a gated CTA of the heart as well as a CTA of the chest, abdomen, and pelvis to evaluate for TAVR anatomy.  Once his studies are completed, his case will be reviewed by our multidisciplinary heart valve team and he will be  referred for formal cardiac surgical consultation as part of a multidisciplinary approach to his care.

## 2024-02-16 NOTE — Progress Notes (Signed)
 Cardiology Office Note:    Date:  02/16/2024   ID:  Peter Gallegos, DOB 01/14/1933, MRN [id number]  PCP:  [name], MD   [facility] Providers Cardiologist:  Madonna Large, DO     Referring MD: [name], MD   Chief Complaint  Patient presents with   Shortness of Breath    History of Present Illness:    Peter Gallegos is a 89 y.o. male presents for follow-up today.   Peter Gallegos is here again with his wife today.  I initially saw him in February 2025 for TAVR evaluation.  At the time I thought it was appropriate to evaluate him for TAVR as he demonstrated findings consistent with severe paradoxical low-flow low gradient aortic stenosis.  However, he was fearful of proceeding and we recommended continued clinical surveillance after discussing all the treatment options.  In the interim, he was hospitalized in August with hematemesis and vasovagal syncope.  Symptoms resolved and hemoglobin improved after discontinuation of Aggrenox .  He had been on that medication for decades after a remote TIA.  The patient remains functionally independent and lives with his wife in [place].  He ambulates sometimes with a cane and sometimes without any assistance.  He is able to walk around in his yard.  He complains of exertional dyspnea and fatigue with activity.  He denies any chest pain or pressure.  The patient is edentulous.  He had a follow-up echocardiogram Nov 03, 2023 which showed consistent findings of normal LVEF 60 to 65%, moderate asymmetric LVH, mild to moderate mitral regurgitation, and findings consistent with severe paradoxical low-flow low gradient aortic stenosis.  The mean gradient was 17 mmHg but dimensionless index was low at 0.22 with a low stroke-volume index of 26 and the calculated aortic valve area of 0.6 cm.  In the interim, the patient has discussed his situation at length with his wife as well as his primary care physician, and he has decided that he  wants to move forward with TAVR to try to improve his quality of life and longevity.  Current Medications: Current Meds  Medication Sig   atorvastatin  (LIPITOR) 40 MG tablet Take 40 mg by mouth every evening.   buPROPion  (WELLBUTRIN  XL) 300 MG 24 hr tablet Take 300 mg by mouth daily.   CALCIUM  PO Take 1 tablet by mouth daily. Take one tablet by mouth daily.   Cholecalciferol (VITAMIN D) 125 MCG (5000 UT) CAPS Take 5,000 Units by mouth daily.   ferrous sulfate 325 (65 FE) MG tablet Take 325 mg by mouth every other day.   losartan  (COZAAR ) 50 MG tablet Take 50 mg by mouth daily.   Multiple Vitamin (MULTIVITAMIN WITH MINERALS) TABS tablet Take 1 tablet by mouth daily.   pantoprazole  (PROTONIX ) 40 MG tablet Take 1 tablet (40 mg total) by mouth 2 (two) times daily before a meal.   potassium chloride  (KLOR-CON ) 10 MEQ tablet Take 10 mEq by mouth every other day.   sertraline  (ZOLOFT ) 50 MG tablet Take 50 mg by mouth every evening.     Allergies:   Keflex [cephalexin]   ROS:   Please see the history of present illness.    All other systems reviewed and are negative.  EKGs/Labs/Other Studies Reviewed:    The following studies were reviewed today: Cardiac Studies & Procedures   ______________________________________________________________________________________________     ECHOCARDIOGRAM  ECHOCARDIOGRAM COMPLETE 11/03/2023  Narrative ECHOCARDIOGRAM REPORT    Patient Name:   Peter Gallegos Date of  Exam: 11/03/2023 Medical Rec #:  [id number]           Height:       66.0 in Accession #:    [id number]          Weight:       130.0 lb Date of Birth:  10-19-1932           BSA:          1.665 m Patient Age:    89 years            BP:           120/78 mmHg Patient Gender: M                   HR:           60 bpm. Exam Location:  [address]  Procedure: 2D Echo and 3D Echo (Both Spectral and Color Flow Doppler were utilized during procedure).  Indications:    I35.0 Nonrheumatic  aortic (valve) stenosis  History:        Patient has prior history of Echocardiogram examinations, most recent 06/21/2023. TIA, Signs/Symptoms:Fatigue; Risk Factors:Dyslipidemia. 1st degree AV block. Exertional dyspnea. Subarachnoid hematoma.  Sonographer:    Jon Hacker RCS Referring Phys: [phone] Remberto Lienhard  IMPRESSIONS   1. Left ventricular ejection fraction, by estimation, is 60 to 65%. Left ventricular ejection fraction by 3D volume is 61 %. The left ventricle has normal function. The left ventricle has no regional wall motion abnormalities. There is moderate asymmetric left ventricular hypertrophy of the basal-septal segment. Left ventricular diastolic parameters are indeterminate. 2. Right ventricular systolic function was not well visualized. The right ventricular size is not well visualized. Tricuspid regurgitation signal is inadequate for assessing PA pressure. 3. The mitral valve is normal in structure. Mild to moderate mitral valve regurgitation. No evidence of mitral stenosis. 4. The aortic valve is tricuspid. There is severe calcifcation of the aortic valve. Aortic valve regurgitation is mild. Severe aortic valve stenosis. Mild AS by gradients (Vmax 2.6 m/s, MG ), but severe by AVA (0.7cm^2) and DI (0.22). Low SV index (26 cc/m^2), suspect paradoxical low flow low gradient severe AS  FINDINGS Left Ventricle: Left ventricular ejection fraction, by estimation, is 60 to 65%. Left ventricular ejection fraction by 3D volume is 61 %. The left ventricle has normal function. The left ventricle has no regional wall motion abnormalities. The left ventricular internal cavity size was normal in size. There is moderate asymmetric left ventricular hypertrophy of the basal-septal segment. Left ventricular diastolic parameters are indeterminate.  Right Ventricle: The right ventricular size is not well visualized. Right vetricular wall thickness was not well visualized. Right  ventricular systolic function was not well visualized. Tricuspid regurgitation signal is inadequate for assessing PA pressure.  Left Atrium: Left atrial size was normal in size.  Right Atrium: Right atrial size was not well visualized.  Pericardium: There is no evidence of pericardial effusion.  Mitral Valve: The mitral valve is normal in structure. Mild to moderate mitral valve regurgitation. No evidence of mitral valve stenosis.  Tricuspid Valve: The tricuspid valve is normal in structure. Tricuspid valve regurgitation is trivial.  Aortic Valve: The aortic valve is tricuspid. There is severe calcifcation of the aortic valve. Aortic valve regurgitation is mild. Severe aortic stenosis is present. Aortic valve mean gradient measures 17.0 mmHg. Aortic valve peak gradient measures 26.4 mmHg. Aortic valve area, by VTI measures 0.68 cm.  Pulmonic Valve: The pulmonic valve was grossly normal.  Pulmonic valve regurgitation is trivial.  Aorta: The aortic root and ascending aorta are structurally normal, with no evidence of dilitation.  IAS/Shunts: The interatrial septum was not well visualized.  Additional Comments: 3D was performed not requiring image post processing on an independent workstation and was normal.   LEFT VENTRICLE PLAX 2D LVIDd:         4.24 cm         Diastology LVIDs:         2.80 cm         LV e' medial:    5.11 cm/s LV PW:         0.95 cm         LV E/e' medial:  16.5 LV IVS:        1.17 cm         LV e' lateral:   10.60 cm/s LVOT diam:     2.00 cm         LV E/e' lateral: 8.0 LV SV:         43 LV SV Index:   26 LVOT Area:     3.14 cm        3D Volume EF LV 3D EF:    Left ventricul ar ejection fraction by 3D volume is 61 %.  3D Volume EF: 3D EF:        61 % LV EDV:       139 ml LV ESV:       54 ml LV SV:        85 ml  RIGHT VENTRICLE RV S prime:     12.40 cm/s TAPSE (M-mode): 2.5 cm  LEFT ATRIUM           Index LA diam:      5.10 cm 3.06 cm/m LA  Vol (A2C): 43.2 ml 25.94 ml/m LA Vol (A4C): 86.5 ml 51.94 ml/m AORTIC VALVE AV Area (Vmax):    0.66 cm AV Area (Vmean):   0.59 cm AV Area (VTI):     0.68 cm AV Vmax:           257.00 cm/s AV Vmean:          197.000 cm/s AV VTI:            0.636 m AV Peak Grad:      26.4 mmHg AV Mean Grad:      17.0 mmHg LVOT Vmax:         54.40 cm/s LVOT Vmean:        36.800 cm/s LVOT VTI:          0.137 m LVOT/AV VTI ratio: 0.22  AORTA Ao Root diam: 3.10 cm Ao Asc diam:  3.50 cm  MITRAL VALVE MV Area (PHT): 6.37 cm    SHUNTS MV Decel Time: 119 msec    Systemic VTI:  0.14 m MV E velocity: 84.40 cm/s  Systemic Diam: 2.00 cm MV A velocity: 84.40 cm/s MV E/A ratio:  1.00  Lonni Nanas MD Electronically signed by Lonni Nanas MD Signature Date/Time: 11/03/2023/2:49:06 PM    Final    MONITORS  LONG TERM MONITOR (3-14 DAYS) 10/04/2021  Narrative Cardiac monitor (Zio Patch): Patch Wear Time:  6 days and 23 hours Dominant rhythm sinus with first-degree AV block. Heart rate 30-102 bpm.  Avg HR 68 bpm. No atrial fibrillation, supraventricular tachycardia, ventricular tachycardia, high grade AV block, pauses (3 seconds or longer). Minimum HR 30 bpm, asymptomatic, on 09/26/2021, at 11:20 PM, sinus with  second-degree type I AV block. Total ventricular ectopic burden <1%. Total supraventricular ectopic burden 0%. Patient triggered events: 0.       ______________________________________________________________________________________________      EKG:        Recent Labs: 01/13/2024: ALT 21 01/15/2024: BUN 22; Creatinine, Ser 1.17; Hemoglobin 10.9; Platelets 193; Potassium 3.6; Sodium 142  Recent Lipid Panel No results found for: CHOL, TRIG, HDL, CHOLHDL, VLDL, LDLCALC, LDLDIRECT           Physical Exam:    VS:  BP (!) 154/86 (BP Location: Left Arm, Cuff Size: Normal)   Pulse 65   Ht 5' 4 (1.626 m)   Wt 152 lb 9.6 oz (69.2 kg)   SpO2 96%   BMI  26.19 kg/m     Wt Readings from Last 3 Encounters:  02/16/24 152 lb 9.6 oz (69.2 kg)  01/26/24 149 lb 6 oz (67.8 kg)  01/25/24 148 lb (67.1 kg)     GEN: Pleasant elderly male in no acute distress HEENT: Normal NECK: No JVD; bilateral carotid bruits LYMPHATICS: No lymphadenopathy CARDIAC: RRR, 3/6 harsh crescendo decrescendo murmur at the right upper sternal border RESPIRATORY:  Clear to auscultation without rales, wheezing or rhonchi  ABDOMEN: Soft, non-tender, non-distended MUSCULOSKELETAL:  No edema; No deformity  SKIN: Warm and dry NEUROLOGIC:  Alert and oriented x 3 PSYCHIATRIC:  Normal affect   Assessment & Plan Nonrheumatic aortic valve stenosis The patient has severe, symptomatic, stage D3 (paradoxical low-flow low gradient) aortic stenosis.  This is associated with NYHA functional class II symptoms of exertional dyspnea and fatigue.  I have personally reviewed his echo images and confirm the presence of normal LVEF and a severely calcified restricted aortic valve with findings outlined above (mean gradient 17 mmHg, aortic valve area 0.6 cm, dimensionless index 0.22, low stroke-volume index). I have reviewed the natural history of aortic stenosis with the patient and their family members who are present today. We have discussed the limitations of medical therapy and the poor prognosis associated with symptomatic aortic stenosis. We have reviewed potential treatment options, including palliative medical therapy, conventional surgical aortic valve replacement, and transcatheter aortic valve replacement. We discussed treatment options in the context of the patient's specific comorbid medical conditions.  The patient understands that further evaluation is required and I have recommended left heart catheterization/coronary angiography/possible PCI to evaluate for the presence of obstructive CAD. I have reviewed the risks, indications, and alternatives to cardiac catheterization, possible  angioplasty, and stenting with the patient. Risks include but are not limited to bleeding, infection, vascular injury, stroke, myocardial infection, arrhythmia, kidney injury, radiation-related injury in the case of prolonged fluoroscopy use, emergency cardiac surgery, and death. The patient understands the risks of serious complication is 1-2 in 1000 with diagnostic cardiac cath and 1-2% or less with angioplasty/stenting.  In addition, he will require a gated CTA of the heart as well as a CTA of the chest, abdomen, and pelvis to evaluate for TAVR anatomy.  Once his studies are completed, his case will be reviewed by our multidisciplinary heart valve team and he will be referred for formal cardiac surgical consultation as part of a multidisciplinary approach to his care. Pre-operative laboratory examination Check pre-procedure labs today.        Informed Consent   Shared Decision Making/Informed Consent The risks [stroke (1 in 1000), death (1 in 1000), kidney failure [usually temporary] (1 in 500), bleeding (1 in 200), allergic reaction [possibly serious] (1 in 200)], benefits (diagnostic support and  management of coronary artery disease) and alternatives of a cardiac catheterization were discussed in detail with Peter Gallegos and he is willing to proceed.       Medication Adjustments/Labs and Tests Ordered: Current medicines are reviewed at length with the patient today.  Concerns regarding medicines are outlined above.  Orders Placed This Encounter  Procedures   CBC   Basic metabolic panel with GFR   Ambulatory referral to Cardiothoracic Surgery   No orders of the defined types were placed in this encounter.   Patient Instructions  Medication Instructions:  No medication changes were made at this visit. Continue current regimen.   *If you need a refill on your cardiac medications before your next appointment, please call your pharmacy*  Lab Work: To be completed today: BMP and CBC  If  you have labs (blood work) drawn today and your tests are completely normal, you will receive your results only by: MyChart Message (if you have MyChart) OR A paper copy in the mail If you have any lab test that is abnormal or we need to change your treatment, we will call you to review the results.  Testing/Procedures: Your physician has requested that you have a cardiac catheterization on 02/29/24 at 10:30 AM (you will need to arrive by 8:30 AM). Cardiac catheterization is used to diagnose and/or treat various heart conditions. Doctors may recommend this procedure for a number of different reasons. The most common reason is to evaluate chest pain. Chest pain can be a symptom of coronary artery disease (CAD), and cardiac catheterization can show whether plaque is narrowing or blocking your heart's arteries. This procedure is also used to evaluate the valves, as well as measure the blood flow and oxygen levels in different parts of your heart. For further information please visit [email or web address] Please follow instruction sheet, as given.  Our nurse navigator will be contacting you to arrange the CT scan(s) for the TAVR work-up.  Follow-Up: At [facility], you and your health needs are our priority.  As part of our continuing mission to provide you with exceptional heart care, our providers are all part of one team.  This team includes your primary Cardiologist (physician) and Advanced Practice Providers or APPs (Physician Assistants and Nurse Practitioners) who all work together to provide you with the care you need, when you need it.  Your next appointment:   Per Structural Heart Team  Provider:   Ozell Fell, MD or Izetta Hummer, PA-C    We recommend signing up for the patient portal called MyChart.  Sign up information is provided on this After Visit Summary.  MyChart is used to connect with patients for Virtual Visits (Telemedicine).  Patients are able to view lab/test  results, encounter notes, upcoming appointments, etc.  Non-urgent messages can be sent to your provider as well.   To learn more about what you can do with MyChart, go to [email or web address].   Other Instructions  You have been referred to cardiothoracic surgery for an appointment regarding your TAVR work-up. Someone will be in contact with you soon about this appointment.         Cardiac/Peripheral Catheterization   You are scheduled for a Cardiac Catheterization on Thursday, September 25 with Dr. Ozell Fell.  1. Please arrive at the [facility] (Main Entrance A) at [facility]: [address] [place], [place] [address] at 8:30 AM (This time is 2 hour(s) before your procedure to ensure your preparation). Your procedure is scheduled to begin  at 10:30 AM.  Free valet parking service is available. You will check in at ADMITTING. The support person will be asked to wait in the waiting room.  It is OK to have someone drop you off and come back when you are ready to be discharged.        Special note: Every effort is made to have your procedure done on time. Please understand that emergencies sometimes delay scheduled procedures.  2. Diet: Nothing to eat after midnight.  3. Hydration:You need to be well hydrated before your procedure. On September 25, you may drink approved liquids (see below) until 2 hours before the procedure, with 16 oz of water as your last intake.   List of approved liquids water, clear juice, clear tea, black coffee, fruit juices, non-citric and without pulp, carbonated beverages, Gatorade, Kool -Aid, plain Jello-O and plain ice popsicles.  4. Labs: You will need to have blood drawn on Wednesday, September 12 at [facility] (1st Floor), [address] [place], [place] [address]. You do not need to be fasting.  5. Medication instructions in preparation for your procedure:   Contrast Allergy: No   Stop  taking, Cozaar  (Losartan ) Wednesday, September 24,   On the morning of your procedure, take Aspirin  81 mg and any morning medicines NOT listed above.  You may use sips of water.  6. Plan to go home the same day, you will only stay overnight if medically necessary. 7. You MUST have a responsible adult to drive you home. 8. An adult MUST be with you the first 24 hours after you arrive home. 9. Bring a current list of your medications, and the last time and date medication taken. 10. Bring ID and current insurance cards. 11.Please wear clothes that are easy to get on and off and wear slip-on shoes.  Thank you for allowing us  to care for you!   -- [facility] Invasive Cardiovascular services     Signed, Ozell Fell, MD  02/16/2024 11:03 AM    [facility]

## 2024-02-16 NOTE — Patient Instructions (Addendum)
 Medication Instructions:  No medication changes were made at this visit. Continue current regimen.   *If you need a refill on your cardiac medications before your next appointment, please call your pharmacy*  Lab Work: To be completed today: BMP and CBC  If you have labs (blood work) drawn today and your tests are completely normal, you will receive your results only by: MyChart Message (if you have MyChart) OR A paper copy in the mail If you have any lab test that is abnormal or we need to change your treatment, we will call you to review the results.  Testing/Procedures: Your physician has requested that you have a cardiac catheterization on 02/29/24 at 10:30 AM (you will need to arrive by 8:30 AM). Cardiac catheterization is used to diagnose and/or treat various heart conditions. Doctors may recommend this procedure for a number of different reasons. The most common reason is to evaluate chest pain. Chest pain can be a symptom of coronary artery disease (CAD), and cardiac catheterization can show whether plaque is narrowing or blocking your heart's arteries. This procedure is also used to evaluate the valves, as well as measure the blood flow and oxygen levels in different parts of your heart. For further information please visit [email or web address] Please follow instruction sheet, as given.  Our nurse navigator will be contacting you to arrange the CT scan(s) for the TAVR work-up.  Follow-Up: At [facility], you and your health needs are our priority.  As part of our continuing mission to provide you with exceptional heart care, our providers are all part of one team.  This team includes your primary Cardiologist (physician) and Advanced Practice Providers or APPs (Physician Assistants and Nurse Practitioners) who all work together to provide you with the care you need, when you need it.  Your next appointment:   Per Structural Heart Team  Provider:   Ozell Fell, MD or  Izetta Hummer, PA-C    We recommend signing up for the patient portal called MyChart.  Sign up information is provided on this After Visit Summary.  MyChart is used to connect with patients for Virtual Visits (Telemedicine).  Patients are able to view lab/test results, encounter notes, upcoming appointments, etc.  Non-urgent messages can be sent to your provider as well.   To learn more about what you can do with MyChart, go to [email or web address].   Other Instructions  You have been referred to cardiothoracic surgery for an appointment regarding your TAVR work-up. Someone will be in contact with you soon about this appointment.         Cardiac/Peripheral Catheterization   You are scheduled for a Cardiac Catheterization on Thursday, September 25 with Dr. Ozell Fell.  1. Please arrive at the [facility] (Main Entrance A) at [facility]: [address] [place], [place] [address] at 8:30 AM (This time is 2 hour(s) before your procedure to ensure your preparation). Your procedure is scheduled to begin at 10:30 AM.  Free valet parking service is available. You will check in at ADMITTING. The support person will be asked to wait in the waiting room.  It is OK to have someone drop you off and come back when you are ready to be discharged.        Special note: Every effort is made to have your procedure done on time. Please understand that emergencies sometimes delay scheduled procedures.  2. Diet: Nothing to eat after midnight.  3. Hydration:You need to be well hydrated before your procedure.  On September 25, you may drink approved liquids (see below) until 2 hours before the procedure, with 16 oz of water as your last intake.   List of approved liquids water, clear juice, clear tea, black coffee, fruit juices, non-citric and without pulp, carbonated beverages, Gatorade, Kool -Aid, plain Jello-O and plain ice popsicles.  4. Labs: You will need to have blood drawn on  Wednesday, September 12 at [facility] (1st Floor), [address], [place], [place] [address]. You do not need to be fasting.  5. Medication instructions in preparation for your procedure:   Contrast Allergy: No   Stop taking, Cozaar  (Losartan ) Wednesday, September 24,   On the morning of your procedure, take Aspirin  81 mg and any morning medicines NOT listed above.  You may use sips of water.  6. Plan to go home the same day, you will only stay overnight if medically necessary. 7. You MUST have a responsible adult to drive you home. 8. An adult MUST be with you the first 24 hours after you arrive home. 9. Bring a current list of your medications, and the last time and date medication taken. 10. Bring ID and current insurance cards. 11.Please wear clothes that are easy to get on and off and wear slip-on shoes.  Thank you for allowing us  to care for you!   -- [place] [facility] Cardi[facility] services

## 2024-02-19 DIAGNOSIS — M6281 Muscle weakness (generalized): Secondary | ICD-10-CM | POA: Diagnosis not present

## 2024-02-19 DIAGNOSIS — Z01812 Encounter for preprocedural laboratory examination: Secondary | ICD-10-CM | POA: Diagnosis not present

## 2024-02-19 LAB — BASIC METABOLIC PANEL WITH GFR
BUN/Creatinine Ratio: 9 — ABNORMAL LOW (ref 10–24)
BUN: 13 mg/dL (ref 8–27)
CO2: 24 mmol/L (ref 20–29)
Calcium: 9.6 mg/dL (ref 8.6–10.2)
Chloride: 105 mmol/L (ref 96–106)
Creatinine, Ser: 1.46 mg/dL — ABNORMAL HIGH (ref 0.76–1.27)
Glucose: 89 mg/dL (ref 70–99)
Potassium: 4.1 mmol/L (ref 3.5–5.2)
Sodium: 143 mmol/L (ref 134–144)
eGFR: 46 mL/min/1.73 — ABNORMAL LOW (ref >59)

## 2024-02-19 LAB — CBC
Hematocrit: 42 % (ref 37.5–51.0)
Hemoglobin: 13.2 g/dL (ref 13.0–17.7)
MCH: 29.9 pg (ref 26.6–33.0)
MCHC: 31.4 g/dL — ABNORMAL LOW (ref 31.5–35.7)
MCV: 95 fL (ref 79–97)
Platelets: 147 x10E3/uL — ABNORMAL LOW (ref 150–450)
RBC: 4.42 x10E6/uL (ref 4.14–5.80)
RDW: 12.8 % (ref 11.6–15.4)
WBC: 6.5 x10E3/uL (ref 3.4–10.8)

## 2024-02-20 ENCOUNTER — Ambulatory Visit: Payer: Self-pay | Admitting: Cardiovascular Disease

## 2024-02-22 ENCOUNTER — Encounter: Payer: Self-pay | Admitting: Cardiovascular Disease

## 2024-02-23 DIAGNOSIS — M6281 Muscle weakness (generalized): Secondary | ICD-10-CM | POA: Diagnosis not present

## 2024-02-27 ENCOUNTER — Telehealth: Payer: Self-pay | Admitting: *Deleted

## 2024-02-27 DIAGNOSIS — M6281 Muscle weakness (generalized): Secondary | ICD-10-CM | POA: Diagnosis not present

## 2024-02-27 NOTE — Telephone Encounter (Signed)
 Cardiac Catheterization scheduled at [facility] for: Thursday February 29, 2024 10:30 AM Arrival time [facility] Main Entrance A at: 8:30 AM  Diet: -Nothing to eat after midnight.  Hydration: -May drink clear liquids until 2 hours before the procedure.  Approved liquids: Water , clear tea, black coffee, fruit juices-non-citric and without pulp,Gatorade, plain Jello/popsicles.   -Please drink 16 oz of water  2 hours before procedure.  Medication instructions: -Hold:  Losartan -day before and day of procedure -per protocol GFR < 60 (46)-pt forgot and took losartan  this morning (9/24) -Other usual morning medications can be taken.  02/22/24 Patient Message indicates to hold aspirin  per Dr Wonda.  Plan to go home the same day, you will only stay overnight if medically necessary.  You must have responsible adult to drive you home.  Someone must be with you the first 24 hours after you arrive home.  Left message for patient to call back to review procedure instructions

## 2024-02-28 NOTE — Telephone Encounter (Signed)
Reviewed procedure instructions with patient's wife (DPR).

## 2024-02-28 NOTE — Telephone Encounter (Signed)
 Pt returning call

## 2024-02-29 ENCOUNTER — Other Ambulatory Visit: Payer: Self-pay

## 2024-02-29 ENCOUNTER — Ambulatory Visit (HOSPITAL_COMMUNITY)
Admission: RE | Disposition: A | Discharge status: Home / Self Care | Payer: Self-pay | Source: Home / Self Care | Attending: Cardiovascular Disease

## 2024-02-29 ENCOUNTER — Ambulatory Visit (HOSPITAL_COMMUNITY)
Admission: RE | Admit: 2024-02-29 | Discharge: 2024-02-29 | Disposition: A | Discharge status: Home / Self Care | Attending: Cardiovascular Disease | Admitting: Cardiovascular Disease

## 2024-02-29 DIAGNOSIS — I350 Nonrheumatic aortic (valve) stenosis: Secondary | ICD-10-CM | POA: Diagnosis not present

## 2024-02-29 DIAGNOSIS — I2510 Atherosclerotic heart disease of native coronary artery without angina pectoris: Secondary | ICD-10-CM | POA: Insufficient documentation

## 2024-02-29 DIAGNOSIS — Z8673 Personal history of transient ischemic attack (TIA), and cerebral infarction without residual deficits: Secondary | ICD-10-CM | POA: Insufficient documentation

## 2024-02-29 HISTORY — PX: LEFT HEART CATH AND CORONARY ANGIOGRAPHY: CATH118249

## 2024-02-29 HISTORY — PX: CORONARY PRESSURE/FFR WITH 3D MAPPING: CATH118309

## 2024-02-29 SURGERY — LEFT HEART CATH AND CORONARY ANGIOGRAPHY
Anesthesia: LOCAL

## 2024-02-29 MED ORDER — LIDOCAINE HCL (PF) 1 % IJ SOLN
INTRAMUSCULAR | Status: AC
  Filled 2024-02-29: qty 30

## 2024-02-29 MED ORDER — LABETALOL HCL 5 MG/ML IV SOLN: 10.0000 mg | INTRAVENOUS | Status: DC | PRN

## 2024-02-29 MED ORDER — LABETALOL HCL 5 MG/ML IV SOLN
INTRAVENOUS | Status: AC
Start: 2024-02-29 — End: 2024-02-29
  Filled 2024-02-29: qty 4

## 2024-02-29 MED ORDER — FREE WATER: 500.0000 mL | Freq: Once | Status: DC

## 2024-02-29 MED ORDER — MIDAZOLAM HCL 2 MG/2ML IJ SOLN
INTRAMUSCULAR | Status: DC | PRN
  Administered 2024-02-29: 1 mg via INTRAVENOUS

## 2024-02-29 MED ORDER — ACETAMINOPHEN 325 MG PO TABS: 650.0000 mg | ORAL_TABLET | ORAL | Status: DC | PRN

## 2024-02-29 MED ORDER — HEPARIN SODIUM (PORCINE) 1000 UNIT/ML IJ SOLN
INTRAMUSCULAR | Status: AC
  Filled 2024-02-29: qty 10

## 2024-02-29 MED ORDER — SODIUM CHLORIDE 0.9% FLUSH: 3.0000 mL | Freq: Two times a day (BID) | INTRAVENOUS | Status: DC

## 2024-02-29 MED ORDER — LIDOCAINE HCL (PF) 1 % IJ SOLN
INTRAMUSCULAR | Status: DC | PRN
  Administered 2024-02-29: 5 mL
  Administered 2024-02-29: 2 mL

## 2024-02-29 MED ORDER — MIDAZOLAM HCL 2 MG/2ML IJ SOLN
INTRAMUSCULAR | Status: AC
  Filled 2024-02-29: qty 2

## 2024-02-29 MED ORDER — FENTANYL CITRATE (PF) 100 MCG/2ML IJ SOLN
INTRAMUSCULAR | Status: DC | PRN
  Administered 2024-02-29: 25 ug via INTRAVENOUS

## 2024-02-29 MED ORDER — FENTANYL CITRATE (PF) 100 MCG/2ML IJ SOLN
INTRAMUSCULAR | Status: AC
  Filled 2024-02-29: qty 2

## 2024-02-29 MED ORDER — IOHEXOL 350 MG/ML SOLN
INTRAVENOUS | Status: DC | PRN
  Administered 2024-02-29: 40 mL

## 2024-02-29 MED ORDER — LABETALOL HCL 5 MG/ML IV SOLN
INTRAVENOUS | Status: AC
  Filled 2024-02-29: qty 4

## 2024-02-29 MED ORDER — ONDANSETRON HCL 4 MG/2ML IJ SOLN: 4.0000 mg | Freq: Four times a day (QID) | INTRAMUSCULAR | Status: DC | PRN

## 2024-02-29 MED ORDER — HYDRALAZINE HCL 20 MG/ML IJ SOLN
INTRAMUSCULAR | Status: DC | PRN
  Administered 2024-02-29: 10 mg via INTRAVENOUS

## 2024-02-29 MED ORDER — LABETALOL HCL 5 MG/ML IV SOLN
INTRAVENOUS | Status: DC | PRN
  Administered 2024-02-29 (×2): 10 mg via INTRAVENOUS

## 2024-02-29 MED ORDER — HEPARIN (PORCINE) IN NACL 1000-0.9 UT/500ML-% IV SOLN
INTRAVENOUS | Status: DC | PRN
  Administered 2024-02-29: 1000 mL

## 2024-02-29 MED ORDER — VERAPAMIL HCL 2.5 MG/ML IV SOLN
INTRAVENOUS | Status: AC
  Filled 2024-02-29: qty 2

## 2024-02-29 MED ORDER — SODIUM CHLORIDE 0.9% FLUSH: 3.0000 mL | INTRAVENOUS | Status: DC | PRN

## 2024-02-29 MED ORDER — HYDRALAZINE HCL 20 MG/ML IJ SOLN
INTRAMUSCULAR | Status: AC
  Filled 2024-02-29: qty 1

## 2024-02-29 MED ORDER — HYDRALAZINE HCL 20 MG/ML IJ SOLN: 10.0000 mg | INTRAMUSCULAR | Status: DC | PRN

## 2024-02-29 MED ORDER — SODIUM CHLORIDE 0.9 % IV SOLN: 250.0000 mL | INTRAVENOUS | Status: DC | PRN

## 2024-02-29 SURGICAL SUPPLY — 10 items
CARD KEY FFR CATHWORX (MISCELLANEOUS) IMPLANT
CATH INFINITI 5FR MULTPACK ANG (CATHETERS) IMPLANT
CLOSURE MYNX CONTROL 5F (Vascular Products) IMPLANT
GLIDESHEATH SLEND SS 6F .021 (SHEATH) IMPLANT
GUIDEWIRE INQWIRE 1.5J.035X260 (WIRE) IMPLANT
KIT MICROPUNCTURE NIT STIFF (SHEATH) IMPLANT
PACK CARDIAC CATHETERIZATION (CUSTOM PROCEDURE TRAY) ×1 IMPLANT
SET ATX-X65L (MISCELLANEOUS) IMPLANT
SHEATH PINNACLE 5F 10CM (SHEATH) IMPLANT
SHEATH PROBE COVER 6X72 (BAG) IMPLANT

## 2024-02-29 NOTE — Interval H&P Note (Signed)
 History and Physical Interval Note:  02/29/2024 11:03 AM  Peter Gallegos  has presented today for surgery, with the diagnosis of aortic stenosis.  The various methods of treatment have been discussed with the patient and family. After consideration of risks, benefits and other options for treatment, the patient has consented to  Procedure(s): LEFT HEART CATH AND CORONARY ANGIOGRAPHY (N/A) as a surgical intervention.  The patient's history has been reviewed, patient examined, no change in status, stable for surgery.  I have reviewed the patient's chart and labs.  Questions were answered to the patient's satisfaction.     Ozell Fell

## 2024-02-29 NOTE — Progress Notes (Signed)
 Patient and daughter was given discharge instructions. Both verbalized understanding.

## 2024-03-01 ENCOUNTER — Encounter (HOSPITAL_COMMUNITY): Payer: Self-pay | Admitting: Cardiovascular Disease

## 2024-03-01 MED FILL — Labetalol HCl IV Soln 5 MG/ML: INTRAVENOUS | Qty: 4 | Status: AC

## 2024-03-01 MED FILL — Verapamil HCl IV Soln 2.5 MG/ML: INTRAVENOUS | Qty: 2 | Status: AC

## 2024-03-08 ENCOUNTER — Other Ambulatory Visit (HOSPITAL_COMMUNITY): Payer: Self-pay

## 2024-03-08 ENCOUNTER — Ambulatory Visit (HOSPITAL_COMMUNITY)
Admission: RE | Admit: 2024-03-08 | Discharge: 2024-03-08 | Disposition: A | Discharge status: Home / Self Care | Source: Ambulatory Visit | Attending: Internal Medicine | Admitting: Internal Medicine

## 2024-03-08 DIAGNOSIS — R911 Solitary pulmonary nodule: Secondary | ICD-10-CM | POA: Diagnosis not present

## 2024-03-08 DIAGNOSIS — I728 Aneurysm of other specified arteries: Secondary | ICD-10-CM | POA: Diagnosis not present

## 2024-03-08 DIAGNOSIS — Z01810 Encounter for preprocedural cardiovascular examination: Secondary | ICD-10-CM | POA: Diagnosis not present

## 2024-03-08 DIAGNOSIS — I350 Nonrheumatic aortic (valve) stenosis: Secondary | ICD-10-CM | POA: Diagnosis not present

## 2024-03-08 DIAGNOSIS — N62 Hypertrophy of breast: Secondary | ICD-10-CM | POA: Diagnosis not present

## 2024-03-08 DIAGNOSIS — K7689 Other specified diseases of liver: Secondary | ICD-10-CM | POA: Diagnosis not present

## 2024-03-08 DIAGNOSIS — I517 Cardiomegaly: Secondary | ICD-10-CM | POA: Diagnosis not present

## 2024-03-08 MED ORDER — IOHEXOL 350 MG/ML SOLN
100.0000 mL | Freq: Once | INTRAVENOUS | Status: AC | PRN
  Administered 2024-03-08: 100 mL via INTRAVENOUS

## 2024-03-08 NOTE — Progress Notes (Signed)
 Procedure Type: Isolated AVR Perioperative Outcome Estimate % Operative Mortality 5.38% Morbidity & Mortality 11.7% Stroke 1.69% Renal Failure 2.58% Reoperation 4.39% Prolonged Ventilation 5.4% Deep Sternal Wound Infection 0.032% Long Hospital Stay (>14 days) 6.2% Short Hospital Stay (<6 days)* 31%

## 2024-03-09 ENCOUNTER — Other Ambulatory Visit: Payer: Self-pay

## 2024-03-09 ENCOUNTER — Encounter (HOSPITAL_COMMUNITY): Payer: Self-pay | Admitting: Emergency Medicine

## 2024-03-09 ENCOUNTER — Emergency Department (HOSPITAL_COMMUNITY)

## 2024-03-09 ENCOUNTER — Emergency Department (HOSPITAL_COMMUNITY): Admission: EM | Admit: 2024-03-09 | Discharge: 2024-03-09 | Disposition: A | Discharge status: Home / Self Care

## 2024-03-09 DIAGNOSIS — W2201XA Walked into wall, initial encounter: Secondary | ICD-10-CM | POA: Diagnosis not present

## 2024-03-09 DIAGNOSIS — M25531 Pain in right wrist: Secondary | ICD-10-CM | POA: Diagnosis not present

## 2024-03-09 DIAGNOSIS — R0781 Pleurodynia: Secondary | ICD-10-CM | POA: Insufficient documentation

## 2024-03-09 DIAGNOSIS — Z743 Need for continuous supervision: Secondary | ICD-10-CM | POA: Diagnosis not present

## 2024-03-09 DIAGNOSIS — R109 Unspecified abdominal pain: Secondary | ICD-10-CM | POA: Diagnosis not present

## 2024-03-09 DIAGNOSIS — W19XXXA Unspecified fall, initial encounter: Secondary | ICD-10-CM

## 2024-03-09 DIAGNOSIS — Z043 Encounter for examination and observation following other accident: Secondary | ICD-10-CM | POA: Diagnosis not present

## 2024-03-09 DIAGNOSIS — R0789 Other chest pain: Secondary | ICD-10-CM

## 2024-03-09 DIAGNOSIS — S52501A Unspecified fracture of the lower end of right radius, initial encounter for closed fracture: Secondary | ICD-10-CM | POA: Diagnosis not present

## 2024-03-09 MED ORDER — LIDOCAINE 5 % EX PTCH
1.0000 | MEDICATED_PATCH | CUTANEOUS | Status: DC
  Administered 2024-03-09: 1 via TRANSDERMAL
  Filled 2024-03-09: qty 1

## 2024-03-09 NOTE — Discharge Instructions (Addendum)
 For pain, you can take 1000 mg of Tylenol  or 1 g of Tylenol  every 6-8 hours.  Do not exceed more than 4000 mg or 4 g in a 24-hour period.   He could also purchase over-the-counter lidocaine  patches for any kind of pain.   There is no evidence of any kind of fracture.  There is no evidence of any kind rib fracture.  Please make sure you take deep breaths multiple times per day to really expand your lungs to prevent pneumonia.   If anything changes, please come back to the ED.

## 2024-03-09 NOTE — ED Notes (Signed)
 Patient reports becoming off balance and having a few falls this year.

## 2024-03-09 NOTE — ED Provider Notes (Signed)
 [place] EMERGENCY DEPARTMENT AT [facility] Provider Note   CSN: [id number] Arrival date & time: 03/09/24  1106     Patient presents with: Peter Gallegos is a 89 y.o. male.    Fall Pertinent negatives include no chest pain, no abdominal pain and no shortness of breath.    Presents because of mechanical fall.  Patient states that he was bending over to pet his dog and subsequently lost his balance and fell against the wall.  Hit the right side of his ribs and stuck his wrist out to catch himself and subsequently having pain in his right ribs as well as right wrist.  Did not hit his head.  No loss conscious.  No cervical thoracic or lumbar pain.  Does not take anticoagulation.  Endorses some pain to palpation of the right wrist as well as some pain to palpation of the right posterior ribs but otherwise denies any shortness of breath.  No hemoptysis.  No abdominal pain.  No chest pain or shortness of breath before or after the event.     Prior to Admission medications   Medication Sig Start Date End Date Taking? Authorizing Provider  atorvastatin  (LIPITOR) 40 MG tablet Take 40 mg by mouth every evening. 06/27/19   [provider]  buPROPion  (WELLBUTRIN  XL) 300 MG 24 hr tablet Take 300 mg by mouth daily.    [provider]  CALCIUM  PO Take 1 tablet by mouth daily. Take one tablet by mouth daily.    [provider]  Cholecalciferol (VITAMIN D) 125 MCG (5000 UT) CAPS Take 5,000 Units by mouth daily.    [provider]  ferrous sulfate 325 (65 FE) MG tablet Take 325 mg by mouth every other day. 01/22/24   [provider]  losartan  (COZAAR ) 50 MG tablet Take 50 mg by mouth daily.    [provider]  Multiple Vitamin (MULTIVITAMIN WITH MINERALS) TABS tablet Take 1 tablet by mouth daily.    [provider]  pantoprazole  (PROTONIX ) 40 MG tablet Take 1 tablet (40 mg total) by mouth 2 (two) times daily before a  meal. 01/15/24   Gherghe, Nilda HERO, MD  potassium chloride  (KLOR-CON ) 10 MEQ tablet Take 10 mEq by mouth every other day.    [provider]  sertraline  (ZOLOFT ) 50 MG tablet Take 50 mg by mouth every evening.    [provider]    Allergies: Keflex [cephalexin]    Review of Systems  Constitutional:  Negative for chills and fever.  HENT:  Negative for ear pain and sore throat.   Eyes:  Negative for pain and visual disturbance.  Respiratory:  Negative for cough and shortness of breath.   Cardiovascular:  Negative for chest pain and palpitations.  Gastrointestinal:  Negative for abdominal pain and vomiting.  Genitourinary:  Negative for dysuria and hematuria.  Musculoskeletal:  Negative for arthralgias and back pain.  Skin:  Negative for color change and rash.  Neurological:  Negative for seizures and syncope.  All other systems reviewed and are negative.   Updated Vital Signs BP (!) 168/88 (BP Location: Left Arm)   Pulse (!) 58   Temp 97.8 F (36.6 C) (Oral)   Resp 16   SpO2 97%   Physical Exam Vitals and nursing note reviewed.  Constitutional:      General: He is not in acute distress.    Appearance: He is well-developed.  HENT:     Head: Normocephalic and  atraumatic.  Eyes:     Conjunctiva/sclera: Conjunctivae normal.  Cardiovascular:     Rate and Rhythm: Normal rate and regular rhythm.     Heart sounds: No murmur heard. Pulmonary:     Effort: Pulmonary effort is normal. No respiratory distress.     Breath sounds: Normal breath sounds.  Abdominal:     Palpations: Abdomen is soft.     Tenderness: There is no abdominal tenderness.  Musculoskeletal:        General: No swelling.       Arms:     Cervical back: Neck supple.       Back:  Skin:    General: Skin is warm and dry.     Capillary Refill: Capillary refill takes less than 2 seconds.  Neurological:     Mental Status: He is alert.  Psychiatric:        Mood and Affect: Mood normal.      (all labs ordered are listed, but only abnormal results are displayed) Labs Reviewed - No data to display  EKG: None  Radiology: DG Ribs Unilateral W/Chest Right Result Date: 03/09/2024 CLINICAL DATA:  Status post fall. EXAM: RIGHT RIBS AND CHEST - 3+ VIEW COMPARISON:  January 13, 2024 FINDINGS: A radiopaque marker was placed at the site of the patient's pain. No fracture or other bone lesions are seen involving the ribs. There is no evidence of pneumothorax or pleural effusion. Both lungs are clear. Heart size and mediastinal contours are within normal limits. There is marked severity calcification of the aortic arch. IMPRESSION: Negative. Electronically Signed   By: Suzen Dials M.D.   On: 03/09/2024 13:24   DG Wrist Complete Right Result Date: 03/09/2024 CLINICAL DATA:  Pain after a fall. EXAM: RIGHT WRIST - COMPLETE 3+ VIEW COMPARISON:  11/22/2020 FINDINGS: Interval plate and screw fixation of the distal radius for fracture seen on the previous exam. No evidence for new acute fracture of the wrist. No subluxation or dislocation. No evidence for hardware complication. IMPRESSION: Interval plate and screw fixation of the distal radius for fracture seen on the previous exam. No acute bony abnormality. Electronically Signed   By: Camellia Candle M.D.   On: 03/09/2024 12:05   CT CORONARY MORPH W/CTA COR W/SCORE W/CA W/CM &/OR WO/CM Result Date: 03/08/2024 CLINICAL DATA:  60M with severe aortic stenosis being evaluated for a TAVR procedure. EXAM: Cardiac TAVR CT TECHNIQUE: A non-contrast, gated CT scan was obtained with axial slices of 2.5 mm through the heart for aortic valve scoring. A 120 kV retrospective, gated, contrast cardiac scan was obtained. Gantry rotation speed was 230 msec and collimation was 0.63 mm. Nitroglycerin was not given. A delayed scan was obtained to exclude left atrial appendage thrombus. The 3D dataset was reconstructed in systole with motion correction. The 3D data set  was reconstructed in 5% intervals of the 0-95% of the R-R cycle. Systolic and diastolic phases were analyzed on a dedicated workstation using MPR, MIP, and VRT modes. The patient received 100 cc of contrast. FINDINGS: Aortic Root: Aortic valve: trileaflet Aortic valve calcium  score: 1111 Aortic annulus: Diameter: 26mm x 22mm Perimeter: 76mm Area: 467mm^2 Calcifications: No calcifications Coronary height: Min Left - 13mm; Min Right - 18mm Sinotubular height: Left cusp - 21mm; Right cusp - 23mm; Noncoronary cusp - 20mm LVOT (as measured 3 mm below the annulus): Diameter: 28mm x 22mm Area: 44mm^2 Calcifications: No calcifications Aortic sinus width: Left cusp - 30mm; Right cusp - 29mm; Noncoronary cusp - 32mm  Sinotubular junction width: 29mm x 28mm Optimum Fluoroscopic Angle for Delivery: LAO 25 CAU 8 Cardiac: Right atrium: Mild enlargement Right ventricle: Moderate dilatation Pulmonary arteries: Normal size Pulmonary veins: Normal configuration Left atrium: Moderate enlargement Left ventricle: Normal size Pericardium: Normal thickness Coronary arteries: Coronary calcium  score 33 IMPRESSION: 1. Tricuspid aortic valve with moderate calcifications (AV calcium  score 1111) 2. Aortic annulus measures 26mm x 22mm in diameter with perimeter 76mm and area 448 mm^2. No annular or LVOT calcifications. Annular measurements are suitable for delivery of a 26mm Edwards Sapien 3 valve 3.  Sufficient coronary to annulus distance. 4.  Optimum Fluoroscopic Angle for Delivery:  LAO 25 CAU 8 5.  Coronary calcium  score 33 Electronically Signed   By: Lonni Nanas M.D.   On: 03/08/2024 17:35     Procedures   Medications Ordered in the ED  lidocaine  (LIDODERM ) 5 % 1 patch (1 patch Transdermal Patch Applied 03/09/24 1232)                                    Medical Decision Making Amount and/or Complexity of Data Reviewed Radiology: ordered.  Risk Prescription drug management.    Presents because of mechanical  fall.  Patient states that he was bending over to pet his dog and subsequently lost his balance and fell against the wall.  Hit the right side of his ribs and stuck his wrist out to catch himself and subsequently having pain in his right ribs as well as right wrist.  Did not hit his head.  No loss conscious.  No cervical thoracic or lumbar pain.  Does not take anticoagulation.  Endorses some pain to palpation of the right wrist as well as some pain to palpation of the right posterior ribs but otherwise denies any shortness of breath.  No hemoptysis.  No abdominal pain.  No chest pain or shortness of breath before or after the event.  Upon exam, patient stable.  ANO x 3 GCS 15.  This was a mechanical fall.  No concern for cardiogenic syncope or other syncope.  Patient lost his balance while bending over.  No laboratory workup or EKG needed.  Does not take anticoagulation.  No head strike.  Mentating well.  No bruising to the head or signs of trauma to the head or neck.  No cervical spine tenderness with palpation as well as with movement.  No indication to obtain CT imaging of head or C-spine.  No thoracic or lumbar pain.  No concerns for any kind of vertebral fracture.  Patient had some palpation at the right wrist.  No scaphoid tenderness.  X-ray of the wrist was unremarkable.  Patient had pain to palpation along the right scapula/posterior aspect of his right ribs.  Lung sounds clear to auscultation bilaterally.  Did obtain dedicated right film x-ray of his right lower ribs as well as chest x-ray.  No obvious fracture seen.  No pneumothorax.  Recommended symptomatic management at home.  Discussed deep breaths multiple times per day to prevent pneumonia.  Pain as well as controlled here so do not think we will have difficult time controlling pain at home in terms of his deep breathing.        Final diagnoses:  Fall, initial encounter  Right wrist pain  Rib pain    ED Discharge Orders      None          Peter Gallegos,  Lavonia SAILOR, MD 03/09/24 [phone]

## 2024-03-09 NOTE — ED Triage Notes (Addendum)
 Patient presents from home post fall. While bending to attend to his dog, he became off balance and fell. He did not hit his head, lose his breath or lose consciousness. He is not on a blood thinner. He did fall on his upper back and now complains of pain around the right shoulder blade/ rib pain.   HX: 2nd degree Heart block / bradycardia  EMS vitals: 140/60 BP 60 HR 100% SPO2 on room air

## 2024-03-10 ENCOUNTER — Ambulatory Visit: Payer: Self-pay | Admitting: Cardiovascular Disease

## 2024-03-11 ENCOUNTER — Emergency Department (HOSPITAL_COMMUNITY)
Admission: EM | Admit: 2024-03-11 | Discharge: 2024-03-11 | Disposition: A | Discharge status: Home / Self Care | Attending: Emergency Medicine | Admitting: Emergency Medicine

## 2024-03-11 ENCOUNTER — Emergency Department (HOSPITAL_COMMUNITY)

## 2024-03-11 ENCOUNTER — Other Ambulatory Visit: Payer: Self-pay

## 2024-03-11 DIAGNOSIS — M4802 Spinal stenosis, cervical region: Secondary | ICD-10-CM | POA: Diagnosis not present

## 2024-03-11 DIAGNOSIS — K5730 Diverticulosis of large intestine without perforation or abscess without bleeding: Secondary | ICD-10-CM | POA: Diagnosis not present

## 2024-03-11 DIAGNOSIS — M546 Pain in thoracic spine: Secondary | ICD-10-CM | POA: Insufficient documentation

## 2024-03-11 DIAGNOSIS — W19XXXA Unspecified fall, initial encounter: Secondary | ICD-10-CM | POA: Diagnosis not present

## 2024-03-11 DIAGNOSIS — R011 Cardiac murmur, unspecified: Secondary | ICD-10-CM | POA: Diagnosis not present

## 2024-03-11 DIAGNOSIS — M545 Low back pain, unspecified: Secondary | ICD-10-CM | POA: Diagnosis not present

## 2024-03-11 DIAGNOSIS — W1830XA Fall on same level, unspecified, initial encounter: Secondary | ICD-10-CM | POA: Diagnosis not present

## 2024-03-11 DIAGNOSIS — N62 Hypertrophy of breast: Secondary | ICD-10-CM | POA: Diagnosis not present

## 2024-03-11 DIAGNOSIS — N281 Cyst of kidney, acquired: Secondary | ICD-10-CM | POA: Insufficient documentation

## 2024-03-11 DIAGNOSIS — R10A1 Flank pain, right side: Secondary | ICD-10-CM | POA: Diagnosis present

## 2024-03-11 DIAGNOSIS — R10A0 Flank pain, unspecified side: Secondary | ICD-10-CM | POA: Insufficient documentation

## 2024-03-11 DIAGNOSIS — J9811 Atelectasis: Secondary | ICD-10-CM | POA: Insufficient documentation

## 2024-03-11 DIAGNOSIS — I700 Atherosclerosis of aorta: Secondary | ICD-10-CM | POA: Insufficient documentation

## 2024-03-11 DIAGNOSIS — I2510 Atherosclerotic heart disease of native coronary artery without angina pectoris: Secondary | ICD-10-CM | POA: Diagnosis not present

## 2024-03-11 DIAGNOSIS — S2241XA Multiple fractures of ribs, right side, initial encounter for closed fracture: Secondary | ICD-10-CM | POA: Diagnosis not present

## 2024-03-11 DIAGNOSIS — I517 Cardiomegaly: Secondary | ICD-10-CM | POA: Insufficient documentation

## 2024-03-11 DIAGNOSIS — Z8673 Personal history of transient ischemic attack (TIA), and cerebral infarction without residual deficits: Secondary | ICD-10-CM | POA: Diagnosis not present

## 2024-03-11 LAB — CBC WITH DIFFERENTIAL/PLATELET
Abs Immature Granulocytes: 0.04 K/uL (ref 0.00–0.07)
Basophils Absolute: 0 K/uL (ref 0.0–0.1)
Basophils Relative: 0 %
Eosinophils Absolute: 0.2 K/uL (ref 0.0–0.5)
Eosinophils Relative: 3 %
HCT: 41.4 % (ref 39.0–52.0)
Hemoglobin: 13.1 g/dL (ref 13.0–17.0)
Immature Granulocytes: 1 %
Lymphocytes Relative: 23 %
Lymphs Abs: 2 K/uL (ref 0.7–4.0)
MCH: 30.3 pg (ref 26.0–34.0)
MCHC: 31.6 g/dL (ref 30.0–36.0)
MCV: 95.8 fL (ref 80.0–100.0)
Monocytes Absolute: 0.7 K/uL (ref 0.1–1.0)
Monocytes Relative: 8 %
Neutro Abs: 5.8 K/uL (ref 1.7–7.7)
Neutrophils Relative %: 65 %
Platelets: 163 K/uL (ref 150–400)
RBC: 4.32 MIL/uL (ref 4.22–5.81)
RDW: 13.5 % (ref 11.5–15.5)
WBC: 8.8 K/uL (ref 4.0–10.5)
nRBC: 0 % (ref 0.0–0.2)

## 2024-03-11 LAB — BASIC METABOLIC PANEL WITH GFR
Anion gap: 10 (ref 5–15)
BUN: 13 mg/dL (ref 8–23)
CO2: 24 mmol/L (ref 22–32)
Calcium: 8.7 mg/dL — ABNORMAL LOW (ref 8.9–10.3)
Chloride: 109 mmol/L (ref 98–111)
Creatinine, Ser: 1.45 mg/dL — ABNORMAL HIGH (ref 0.61–1.24)
GFR, Estimated: 46 mL/min — ABNORMAL LOW (ref >60)
Glucose, Bld: 112 mg/dL — ABNORMAL HIGH (ref 70–99)
Potassium: 4.3 mmol/L (ref 3.5–5.1)
Sodium: 143 mmol/L (ref 135–145)

## 2024-03-11 MED ORDER — IOHEXOL 350 MG/ML SOLN
75.0000 mL | Freq: Once | INTRAVENOUS | Status: AC | PRN
Start: 2024-03-11 — End: 2024-03-11
  Administered 2024-03-11: 75 mL via INTRAVENOUS

## 2024-03-11 MED ORDER — FENTANYL CITRATE PF 50 MCG/ML IJ SOSY
50.0000 ug | PREFILLED_SYRINGE | Freq: Once | INTRAMUSCULAR | Status: AC
  Administered 2024-03-11: 50 ug via INTRAVENOUS
  Filled 2024-03-11: qty 1

## 2024-03-11 MED ORDER — ACETAMINOPHEN 500 MG PO TABS: 1000.0000 mg | ORAL_TABLET | Freq: Once | ORAL | Status: DC

## 2024-03-11 MED ORDER — LIDOCAINE 5 % EX PTCH: 1.0000 | MEDICATED_PATCH | CUTANEOUS | 0 refills | Status: DC

## 2024-03-11 MED ORDER — FENTANYL CITRATE PF 50 MCG/ML IJ SOSY: 50.0000 ug | PREFILLED_SYRINGE | Freq: Once | INTRAMUSCULAR | Status: DC

## 2024-03-11 MED ORDER — LIDOCAINE 5 % EX PTCH
1.0000 | MEDICATED_PATCH | CUTANEOUS | Status: DC
  Administered 2024-03-11: 1 via TRANSDERMAL
  Filled 2024-03-11: qty 1

## 2024-03-11 NOTE — ED Triage Notes (Signed)
 Patient arrives via [facility] for back pain from home. Fell on Saturday seen at [facility], worsening back pain right thoracic region. Patient endorses issues taking deep breaths. According to wife, patient did not have spinal XR. Alert and oriented x4.  EMS vitals 154 palp HR 77 96 on room air 20 RR

## 2024-03-11 NOTE — Discharge Instructions (Signed)
 Please use I-S 10-12 times hourly.

## 2024-03-11 NOTE — ED Provider Notes (Signed)
 [place] [facility] AT [facility] Provider Note   CSN: [id number] Arrival date & time: 03/11/24  1511     Patient presents with: Back Pain   Peter Gallegos is a 89 y.o. male.   Back Pain  Patient is an 89 year old male with PMH of severe aortic stenosis, GERD, and prior TIA with transient aphasia in 2007, presenting to the ED via [facility] with chief complaint of worsening back pain in the setting of recent fall Saturday, 03/09/2024.  Patient reports right flank and mid back pain, worse with movement, and deep inspiration, since this fall.  He reports losing his footing falling sideways into the wall without head trauma or LOC.  Notably, patient seen 10/04 for associated fall with negative CXR, however no CT imaging.  Patient denies recent fevers, chills, nausea, vomiting, headache, neck pain, abdominal pain, changes in urination, changes in bowel movements, focal weakness, saddle anesthesia, and vision changes.   Prior to Admission medications   Medication Sig Start Date End Date Taking? Authorizing Provider  atorvastatin  (LIPITOR) 40 MG tablet Take 40 mg by mouth every evening. 06/27/19   [provider]  buPROPion  (WELLBUTRIN  XL) 300 MG 24 hr tablet Take 300 mg by mouth daily.    [provider]  CALCIUM  PO Take 1 tablet by mouth daily. Take one tablet by mouth daily.    [provider]  Cholecalciferol (VITAMIN D) 125 MCG (5000 UT) CAPS Take 5,000 Units by mouth daily.    [provider]  ferrous sulfate 325 (65 FE) MG tablet Take 325 mg by mouth every other day. 01/22/24   [provider]  losartan  (COZAAR ) 50 MG tablet Take 50 mg by mouth daily.    [provider]  Multiple Vitamin (MULTIVITAMIN WITH MINERALS) TABS tablet Take 1 tablet by mouth daily.    [provider]  pantoprazole  (PROTONIX ) 40 MG tablet Take 1 tablet (40 mg total) by mouth 2 (two) times daily before a meal. 01/15/24    Gherghe, Nilda HERO, MD  potassium chloride  (KLOR-CON ) 10 MEQ tablet Take 10 mEq by mouth every other day.    [provider]  sertraline  (ZOLOFT ) 50 MG tablet Take 50 mg by mouth every evening.    [provider]    Allergies: Keflex [cephalexin]    Review of Systems  Musculoskeletal:  Positive for back pain.    Updated Vital Signs BP (!) 180/94   Pulse 71   Temp 98.5 F (36.9 C) (Oral)   Resp 16   Ht 5' 6 (1.676 m)   Wt 68 kg   SpO2 94%   BMI 24.21 kg/m   Physical Exam Constitutional:      Appearance: Normal appearance.  HENT:     Head: Normocephalic and atraumatic.     Right Ear: Ear canal and external ear normal.     Left Ear: Ear canal and external ear normal.     Nose: Nose normal.     Mouth/Throat:     Mouth: Mucous membranes are moist.     Pharynx: Oropharynx is clear.  Eyes:     Extraocular Movements: Extraocular movements intact.     Conjunctiva/sclera: Conjunctivae normal.     Pupils: Pupils are equal, round, and reactive to light.  Cardiovascular:     Rate and Rhythm: Normal rate and regular rhythm.     Pulses: Normal pulses.     Heart sounds: Murmur heard.  Pulmonary:     Effort: Pulmonary  effort is normal.     Breath sounds: Normal breath sounds.  Abdominal:     General: Abdomen is flat.     Palpations: Abdomen is soft.  Musculoskeletal:     Cervical back: Normal range of motion.     Comments: No C/L spine tenderness along midline, no bony step-offs or deformities  Significant T-spine newness to palpation along midline without appreciable bony step-off or deformity  Right flank bony tenderness overlying lateral ribs  Skin:    General: Skin is warm and dry.     Capillary Refill: Capillary refill takes less than 2 seconds.  Neurological:     General: No focal deficit present.     Mental Status: He is oriented to person, place, and time.     (all labs ordered are listed, but only abnormal results are displayed) Labs Reviewed   BASIC METABOLIC PANEL WITH GFR - Abnormal; Notable for the following components:      Result Value   Glucose, Bld 112 (*)    Creatinine, Ser 1.45 (*)    Calcium  8.7 (*)    GFR, Estimated 46 (*)    All other components within normal limits  CBC WITH DIFFERENTIAL/PLATELET    EKG: None  Radiology: CT L-SPINE NO CHARGE Result Date: 03/11/2024 CLINICAL DATA:  Fall.  Flank pain. EXAM: CT Thoracic and Lumbar spine with no additional contrast TECHNIQUE: Multiplanar CT images of the thoracic and lumbar spine were reconstructed from contemporary CT of the Chest, Abdomen, and Pelvis. RADIATION DOSE REDUCTION: This exam was performed according to the departmental dose-optimization program which includes automated exposure control, adjustment of the mA and/or kV according to patient size and/or use of iterative reconstruction technique. CONTRAST:  No additional COMPARISON:  CT cervical spine 08/30/2023 FINDINGS: CT THORACIC SPINE FINDINGS Alignment: Normal. Vertebrae: Multilevel mild degenerative changes of the spine with no severe osseous neural foraminal or central canal stenosis. No acute fracture or focal pathologic process. Paraspinal and other soft tissues: Negative. Disc levels: Grossly maintained. CT LUMBAR SPINE FINDINGS Segmentation: 5 lumbar type vertebrae. Alignment: Normal. Vertebrae: Diffusely decreased bone density. Multilevel mild to moderate degenerative changes of the spine. No associated severe osseous neural foraminal or central canal stenosis. No acute fracture or focal pathologic process. Paraspinal and other soft tissues: Negative. Disc levels: Intervertebral disc space vacuum phenomenon at the L5-S1 level. Other: Degenerative changes of the visualized lower cervical spine with at least moderate osseous neural foraminal stenosis. Grade 1 anterolisthesis of C7 on T1. Please see separately dictated CT chest abdomen pelvis 03/11/2024. IMPRESSION: No acute displaced fracture or traumatic  listhesis of the thoracolumbar spine. Electronically Signed   By: Morgane  Naveau M.D.   On: 03/11/2024 21:28   CT T-SPINE NO CHARGE Result Date: 03/11/2024 CLINICAL DATA:  Fall.  Flank pain. EXAM: CT Thoracic and Lumbar spine with no additional contrast TECHNIQUE: Multiplanar CT images of the thoracic and lumbar spine were reconstructed from contemporary CT of the Chest, Abdomen, and Pelvis. RADIATION DOSE REDUCTION: This exam was performed according to the departmental dose-optimization program which includes automated exposure control, adjustment of the mA and/or kV according to patient size and/or use of iterative reconstruction technique. CONTRAST:  No additional COMPARISON:  CT cervical spine 08/30/2023 FINDINGS: CT THORACIC SPINE FINDINGS Alignment: Normal. Vertebrae: Multilevel mild degenerative changes of the spine with no severe osseous neural foraminal or central canal stenosis. No acute fracture or focal pathologic process. Paraspinal and other soft tissues: Negative. Disc levels: Grossly maintained. CT LUMBAR SPINE FINDINGS  Segmentation: 5 lumbar type vertebrae. Alignment: Normal. Vertebrae: Diffusely decreased bone density. Multilevel mild to moderate degenerative changes of the spine. No associated severe osseous neural foraminal or central canal stenosis. No acute fracture or focal pathologic process. Paraspinal and other soft tissues: Negative. Disc levels: Intervertebral disc space vacuum phenomenon at the L5-S1 level. Other: Degenerative changes of the visualized lower cervical spine with at least moderate osseous neural foraminal stenosis. Grade 1 anterolisthesis of C7 on T1. Please see separately dictated CT chest abdomen pelvis 03/11/2024. IMPRESSION: No acute displaced fracture or traumatic listhesis of the thoracolumbar spine. Electronically Signed   By: Morgane  Naveau M.D.   On: 03/11/2024 21:28   CT CHEST ABDOMEN PELVIS W CONTRAST Result Date: 03/11/2024 CLINICAL DATA:  fall, flank  pain EXAM: CT CHEST, ABDOMEN, AND PELVIS WITH CONTRAST TECHNIQUE: Multidetector CT imaging of the chest, abdomen and pelvis was performed following the standard protocol during bolus administration of intravenous contrast. RADIATION DOSE REDUCTION: This exam was performed according to the departmental dose-optimization program which includes automated exposure control, adjustment of the mA and/or kV according to patient size and/or use of iterative reconstruction technique. CONTRAST:  75mL OMNIPAQUE  IOHEXOL  350 MG/ML SOLN COMPARISON:  None Available. FINDINGS: CHEST: Cardiovascular: No aortic injury. The thoracic aorta is normal in caliber. The heart is mildly enlarged in size. No significant pericardial effusion. Severe atherosclerotic plaque. The main pulmonary artery is normal in caliber. No central or segmental pulmonary embolus. Limited evaluation more distally due to timing of contrast. Mediastinum/Nodes: No pneumomediastinum. No mediastinal hematoma. The esophagus is unremarkable. The thyroid is unremarkable. The central airways are patent. No mediastinal, hilar, or axillary lymphadenopathy. Lungs/Pleura: Biapical pleural/pulmonary scarring. Right lower lobe atelectasis. No focal consolidation. No pulmonary nodule. No pulmonary mass. No pulmonary contusion or laceration. No pneumatocele formation. No pleural effusion. No pneumothorax. No hemothorax. Musculoskeletal/Chest wall: No chest wall mass.  Bilateral gynecomastia. Acute right posterior 9-11 nondisplaced rib fractures. Please see separately dictated CT thoracolumbar spine. ABDOMEN / PELVIS: Hepatobiliary: Not enlarged. Fluid density lesion of the liver likely represents a simple hepatic cyst. No laceration or subcapsular hematoma. The gallbladder is otherwise unremarkable with no radio-opaque gallstones. No biliary ductal dilatation. Pancreas: Normal pancreatic contour. No main pancreatic duct dilatation. Spleen: Not enlarged. No focal lesion. No  laceration, subcapsular hematoma, or vascular injury. Adrenals/Urinary Tract: No nodularity bilaterally. Bilateral kidneys enhance symmetrically. No hydronephrosis. No contusion, laceration, or subcapsular hematoma. Fluid density lesion left kidney likely represents a simple renal cyst. Simple renal cysts, in the absence of clinically indicated signs/symptoms, require no independent follow-up. Subcentimeter hypodensities are too small to characterize-no further follow-up indicated. No injury to the vascular structures or collecting systems. No hydroureter. The urinary bladder is unremarkable. On delayed imaging, there is no urothelial wall thickening and there are no filling defects in the opacified portions of the bilateral collecting systems or ureters. Stomach/Bowel: No small or large bowel wall thickening or dilatation. Colonic diverticulosis. Stool throughout the majority of the colon other than the redundant sigmoid colon. The appendix is unremarkable. Vasculature/Lymphatics: No abdominal aorta or iliac aneurysm. No active contrast extravasation or pseudoaneurysm. No abdominal, pelvic, inguinal lymphadenopathy. Reproductive: Prostate is unremarkable. Other: No simple free fluid ascites. No pneumoperitoneum. No hemoperitoneum. No mesenteric hematoma identified. No organized fluid collection. Musculoskeletal: No significant soft tissue hematoma. No acute pelvic fracture. Please see separately dictated CT thoracolumbar spine. Other ports and devices: None. IMPRESSION: 1. Acute right posterior 9-11 nondisplaced rib fractures. No associated pneumothorax. 2. No acute intrathoracic, intra-abdominal, intrapelvic  traumatic injury. 3. Please see separately dictated CT thoracolumbar spine. 4. Other imaging findings of potential clinical significance: Cardiomegaly. Aortic Atherosclerosis (ICD10-I70.0) including coronary artery calcification. Electronically Signed   By: Morgane  Naveau M.D.   On: 03/11/2024 21:25      Procedures   Medications Ordered in the ED  fentaNYL  (SUBLIMAZE ) injection 50 mcg (has no administration in time range)  acetaminophen  (TYLENOL ) tablet 1,000 mg (has no administration in time range)  fentaNYL  (SUBLIMAZE ) injection 50 mcg (50 mcg Intravenous Given 03/11/24 1839)  iohexol  (OMNIPAQUE ) 350 MG/ML injection 75 mL (75 mLs Intravenous Contrast Given 03/11/24 2115)     Medical Decision Making Amount and/or Complexity of Data Reviewed Labs: ordered. Radiology: ordered.  Risk OTC drugs. Prescription drug management.    89 year old male presenting to the ED with ongoing right flank and midline back pain without associated neurologic symptoms x 2 days in the setting of recent mechanical ground-level fall with ED evaluation 10/04.  Upon arrival, patient hypertensive 167/108 with known severe AS.  Afebrile.  No tachycardia or tachypnea.  Saturate 96% RA. GCS 15.  Patient uncomfortable appearing during examination with minor movements, given 50 mcg IV fentanyl  with symptomatic improvement on reassessment.  Physical examination significant for midline T-spine tenderness to palpation as well as right flank/rib tenderness to palpation.  CT imaging significant for posterior right ninth through 11 nondisplaced rib fractures.  No pneumothorax appreciated. Thoracic or lumbar spinal fractures or malalignment.  On reassessment, patient resting comfortably with symptomatic improvement following IV analgesia.  Updated regarding imaging findings.  Patient counseled on incentive spirometry use and trialed I-S at bedside following RT education.  Patient pulling greater than 80% predicted volume on IS.  At this time, patient hemodynamically stable and appropriate for discharge.  Final diagnoses:  Closed fracture of multiple ribs of right side, initial encounter    ED Discharge Orders     None          Juanluis Guastella, Elsie, MD 03/11/24 7791    Tonia Chew, MD 03/18/24 1355

## 2024-03-21 DIAGNOSIS — I10 Essential (primary) hypertension: Secondary | ICD-10-CM | POA: Diagnosis not present

## 2024-03-22 DIAGNOSIS — M810 Age-related osteoporosis without current pathological fracture: Secondary | ICD-10-CM | POA: Diagnosis not present

## 2024-03-22 DIAGNOSIS — D649 Anemia, unspecified: Secondary | ICD-10-CM | POA: Diagnosis not present

## 2024-03-22 DIAGNOSIS — S2241XA Multiple fractures of ribs, right side, initial encounter for closed fracture: Secondary | ICD-10-CM | POA: Diagnosis not present

## 2024-03-22 DIAGNOSIS — N1831 Chronic kidney disease, stage 3a: Secondary | ICD-10-CM | POA: Diagnosis not present

## 2024-03-22 DIAGNOSIS — I10 Essential (primary) hypertension: Secondary | ICD-10-CM | POA: Diagnosis not present

## 2024-03-22 DIAGNOSIS — E876 Hypokalemia: Secondary | ICD-10-CM | POA: Diagnosis not present

## 2024-03-22 DIAGNOSIS — I350 Nonrheumatic aortic (valve) stenosis: Secondary | ICD-10-CM | POA: Diagnosis not present

## 2024-04-01 NOTE — Progress Notes (Deleted)
 [address]       [place] [address]             [phone]        Peter Gallegos [facility] Medical Record #[id number] Date of Birth: Jun 19, 1932  Referring: Peter Nottingham, MD Primary Care: Peter Nottingham, MD Primary Cardiologist:[name], DO  Chief Complaint:   No chief complaint on file.   History of Present Illness:     Peter Gallegos is a 89 y.o. male presents for surgical evaluation of ***  Peter Gallegos is here again with his wife today. I initially saw him in February 2025 for TAVR evaluation. At the time I thought it was appropriate to evaluate him for TAVR as he demonstrated findings consistent with severe paradoxical low-flow low gradient aortic stenosis. However, he was fearful of proceeding and we recommended continued clinical surveillance after discussing all the treatment options. In the interim, he was hospitalized in August with hematemesis and vasovagal syncope. Symptoms resolved and hemoglobin improved after discontinuation of Aggrenox . He had been on that medication for decades after a remote TIA. The patient remains functionally independent and lives with his wife in [place]. He ambulates sometimes with a cane and sometimes without any assistance. He is able to walk around in his yard. He complains of exertional dyspnea and fatigue with activity. He denies any chest pain or pressure. The patient is edentulous. He had a follow-up echocardiogram Nov 03, 2023 which showed consistent findings of normal LVEF 60 to 65%, moderate asymmetric LVH, mild to moderate mitral regurgitation, and findings consistent with severe paradoxical low-flow low gradient aortic stenosis. The mean gradient was 17 mmHg but dimensionless index was low at 0.22 with a low stroke-volume index of 26 and the calculated aortic valve area of 0.6 cm. In the interim, the patient has discussed his situation at length with his wife as well as his primary care physician, and he  has decided that he wants to move forward with TAVR to try to improve his quality of life and longevity.      Past Medical History:  Diagnosis Date   Anxiety and depression    Depression    Diverticulosis    Family history of coronary artery disease    GERD (gastroesophageal reflux disease)    History of arm fracture    Rt arm, LT elbow fracture   History of colon polyps    History of ETOH abuse    Quit in 1980   Hypercholesterolemia    Hypertension    PONV (postoperative nausea and vomiting)    Severe aortic stenosis    TIA (transient ischemic attack) 12/04/2005    Past Surgical History:  Procedure Laterality Date   COLONOSCOPY     CORONARY PRESSURE/FFR WITH 3D MAPPING N/A 02/29/2024   Procedure: Coronary Pressure/FFR w/3D Mapping;  Surgeon: Peter Sharper, MD;  Location: [facility] INVASIVE CV LAB;  Service: Cardiovascular;  Laterality: N/A;   ESOPHAGOGASTRODUODENOSCOPY N/A 01/13/2024   Procedure: EGD (ESOPHAGOGASTRODUODENOSCOPY);  Surgeon: Peter Gordy HERO, MD;  Location: [facility] ENDOSCOPY;  Service: Gastroenterology;  Laterality: N/A;   LEFT HEART CATH AND CORONARY ANGIOGRAPHY N/A 02/29/2024   Procedure: LEFT HEART CATH AND CORONARY ANGIOGRAPHY;  Surgeon: Peter Sharper, MD;  Location: [facility] INVASIVE CV LAB;  Service: Cardiovascular;  Laterality: N/A;   OPEN REDUCTION INTERNAL FIXATION (ORIF) DISTAL PHALANX Left 11/21/2013   Procedure: OPEN REDUCTION INTERNAL FIXATION (ORIF) DISTAL PHALANX;  Surgeon: Peter LELON Pagan, MD;  Location: [facility]  OR;  Service: Orthopedics;  Laterality: Left;   ORIF WRIST FRACTURE Right 11/25/2020   Procedure: OPEN REDUCTION INTERNAL FIXATION WRIST FRACTURE;  Surgeon: Peter Fallow, MD;  Location: [facility] OR;  Service: Orthopedics;  Laterality: Right;    Social History:  Social History   Tobacco Use  Smoking Status Former   Types: Pipe   Quit date: 1986   Years since quitting: 39.8  Smokeless Tobacco Never  Tobacco Comments   Quit smoking pipe years ago    Social  History   Substance and Sexual Activity  Alcohol Use No     Allergies  Allergen Reactions   Keflex [Cephalexin] Hives, Diarrhea and Itching      Current Outpatient Medications  Medication Sig Dispense Refill   atorvastatin  (LIPITOR) 40 MG tablet Take 40 mg by mouth every evening.     buPROPion  (WELLBUTRIN  XL) 300 MG 24 hr tablet Take 300 mg by mouth daily.     CALCIUM  PO Take 1 tablet by mouth daily. Take one tablet by mouth daily.     Cholecalciferol (VITAMIN D) 125 MCG (5000 UT) CAPS Take 5,000 Units by mouth daily.     ferrous sulfate 325 (65 FE) MG tablet Take 325 mg by mouth every other day.     lidocaine  (LIDODERM ) 5 % Place 1 patch onto the skin daily. Remove & Discard patch within 12 hours or as directed by MD 30 patch 0   losartan  (COZAAR ) 50 MG tablet Take 50 mg by mouth daily.     Multiple Vitamin (MULTIVITAMIN WITH MINERALS) TABS tablet Take 1 tablet by mouth daily.     pantoprazole  (PROTONIX ) 40 MG tablet Take 1 tablet (40 mg total) by mouth 2 (two) times daily before a meal. 180 tablet 0   potassium chloride  (KLOR-CON ) 10 MEQ tablet Take 10 mEq by mouth every other day.     sertraline  (ZOLOFT ) 50 MG tablet Take 50 mg by mouth every evening.     No current facility-administered medications for this visit.    (Not in a hospital admission)   Family History  Problem Relation Age of Onset   Heart disease Mother    Heart disease Father        Deceased 64 ( Had MI )     Review of Systems:   ROS    Physical Exam: There were no vitals taken for this visit. Physical Exam    Cardiac Studies & Procedures   ______________________________________________________________________________________________ CARDIAC CATHETERIZATION  CARDIAC CATHETERIZATION 02/29/2024  Conclusion 1.  Moderate nonobstructive LAD stenosis of 50% with negative cath works angio FFR of 0.87 2.  Widely patent left main, left circumflex, and RCA with no significant stenoses 3.  Known  severe aortic stenosis  Findings Coronary Findings Diagnostic  Dominance: Right  Left Main Vessel is angiographically normal. The left main is patent with no stenosis  Left Anterior Descending The LAD has a moderate 50% mid vessel lesion interrogated with cath works angio FFR which yielded a result of 0.87 (hemodynamically insignificant). Mid LAD lesion is 50% stenosed.  Left Circumflex Vessel is angiographically normal. The circumflex is patent with no stenosis  Right Coronary Artery Vessel is angiographically normal. Moderate caliber vessel with no stenosis  Intervention  No interventions have been documented.     ECHOCARDIOGRAM  ECHOCARDIOGRAM COMPLETE 11/03/2023  Narrative ECHOCARDIOGRAM REPORT    Patient Name:   HELMER DULL Date of Exam: 11/03/2023 Medical Rec #:  [id number]  Height:       66.0 in Accession #:    [id number]          Weight:       130.0 lb Date of Birth:  Jun 13, 1932           BSA:          1.665 m Patient Age:    89 years            BP:           120/78 mmHg Patient Gender: M                   HR:           60 bpm. Exam Location:  [address]  Procedure: 2D Echo and 3D Echo (Both Spectral and Color Flow Doppler were utilized during procedure).  Indications:    I35.0 Nonrheumatic aortic (valve) stenosis  History:        Patient has prior history of Echocardiogram examinations, most recent 06/21/2023. TIA, Signs/Symptoms:Fatigue; Risk Factors:Dyslipidemia. 1st degree AV block. Exertional dyspnea. Subarachnoid hematoma.  Sonographer:    Jon Hacker RCS Referring Phys: [phone] MICHAEL COOPER  IMPRESSIONS   1. Left ventricular ejection fraction, by estimation, is 60 to 65%. Left ventricular ejection fraction by 3D volume is 61 %. The left ventricle has normal function. The left ventricle has no regional wall motion abnormalities. There is moderate asymmetric left ventricular hypertrophy of the basal-septal segment. Left  ventricular diastolic parameters are indeterminate. 2. Right ventricular systolic function was not well visualized. The right ventricular size is not well visualized. Tricuspid regurgitation signal is inadequate for assessing PA pressure. 3. The mitral valve is normal in structure. Mild to moderate mitral valve regurgitation. No evidence of mitral stenosis. 4. The aortic valve is tricuspid. There is severe calcifcation of the aortic valve. Aortic valve regurgitation is mild. Severe aortic valve stenosis. Mild AS by gradients (Vmax 2.6 m/s, MG ), but severe by AVA (0.7cm^2) and DI (0.22). Low SV index (26 cc/m^2), suspect paradoxical low flow low gradient severe AS  FINDINGS Left Ventricle: Left ventricular ejection fraction, by estimation, is 60 to 65%. Left ventricular ejection fraction by 3D volume is 61 %. The left ventricle has normal function. The left ventricle has no regional wall motion abnormalities. The left ventricular internal cavity size was normal in size. There is moderate asymmetric left ventricular hypertrophy of the basal-septal segment. Left ventricular diastolic parameters are indeterminate.  Right Ventricle: The right ventricular size is not well visualized. Right vetricular wall thickness was not well visualized. Right ventricular systolic function was not well visualized. Tricuspid regurgitation signal is inadequate for assessing PA pressure.  Left Atrium: Left atrial size was normal in size.  Right Atrium: Right atrial size was not well visualized.  Pericardium: There is no evidence of pericardial effusion.  Mitral Valve: The mitral valve is normal in structure. Mild to moderate mitral valve regurgitation. No evidence of mitral valve stenosis.  Tricuspid Valve: The tricuspid valve is normal in structure. Tricuspid valve regurgitation is trivial.  Aortic Valve: The aortic valve is tricuspid. There is severe calcifcation of the aortic valve. Aortic valve  regurgitation is mild. Severe aortic stenosis is present. Aortic valve mean gradient measures 17.0 mmHg. Aortic valve peak gradient measures 26.4 mmHg. Aortic valve area, by VTI measures 0.68 cm.  Pulmonic Valve: The pulmonic valve was grossly normal. Pulmonic valve regurgitation is trivial.  Aorta: The aortic root and ascending aorta are structurally normal, with  no evidence of dilitation.  IAS/Shunts: The interatrial septum was not well visualized.  Additional Comments: 3D was performed not requiring image post processing on an independent workstation and was normal.   LEFT VENTRICLE PLAX 2D LVIDd:         4.24 cm         Diastology LVIDs:         2.80 cm         LV e' medial:    5.11 cm/s LV PW:         0.95 cm         LV E/e' medial:  16.5 LV IVS:        1.17 cm         LV e' lateral:   10.60 cm/s LVOT diam:     2.00 cm         LV E/e' lateral: 8.0 LV SV:         43 LV SV Index:   26 LVOT Area:     3.14 cm        3D Volume EF LV 3D EF:    Left ventricul ar ejection fraction by 3D volume is 61 %.  3D Volume EF: 3D EF:        61 % LV EDV:       139 ml LV ESV:       54 ml LV SV:        85 ml  RIGHT VENTRICLE RV S prime:     12.40 cm/s TAPSE (M-mode): 2.5 cm  LEFT ATRIUM           Index LA diam:      5.10 cm 3.06 cm/m LA Vol (A2C): 43.2 ml 25.94 ml/m LA Vol (A4C): 86.5 ml 51.94 ml/m AORTIC VALVE AV Area (Vmax):    0.66 cm AV Area (Vmean):   0.59 cm AV Area (VTI):     0.68 cm AV Vmax:           257.00 cm/s AV Vmean:          197.000 cm/s AV VTI:            0.636 m AV Peak Grad:      26.4 mmHg AV Mean Grad:      17.0 mmHg LVOT Vmax:         54.40 cm/s LVOT Vmean:        36.800 cm/s LVOT VTI:          0.137 m LVOT/AV VTI ratio: 0.22  AORTA Ao Root diam: 3.10 cm Ao Asc diam:  3.50 cm  MITRAL VALVE MV Area (PHT): 6.37 cm    SHUNTS MV Decel Time: 119 msec    Systemic VTI:  0.14 m MV E velocity: 84.40 cm/s  Systemic Diam: 2.00 cm MV A velocity:  84.40 cm/s MV E/A ratio:  1.00  Lonni Nanas MD Electronically signed by Lonni Nanas MD Signature Date/Time: 11/03/2023/2:49:06 PM    Final    MONITORS  LONG TERM MONITOR (3-14 DAYS) 10/04/2021  Narrative Cardiac monitor (Zio Patch): Patch Wear Time:  6 days and 23 hours Dominant rhythm sinus with first-degree AV block. Heart rate 30-102 bpm.  Avg HR 68 bpm. No atrial fibrillation, supraventricular tachycardia, ventricular tachycardia, high grade AV block, pauses (3 seconds or longer). Minimum HR 30 bpm, asymptomatic, on 09/26/2021, at 11:20 PM, sinus with second-degree type I AV block. Total ventricular ectopic burden <1%. Total supraventricular ectopic burden 0%. Patient triggered  events: 0.   CT SCANS  CT CORONARY MORPH W/CTA COR W/SCORE 03/08/2024  Addendum 03/11/2024 10:13 PM ADDENDUM REPORT: 03/11/2024 22:11  EXAM: OVER-READ INTERPRETATION  CT CHEST  The following report is an over-read performed by radiologist Dr. Morgane Naveauof [facility] on 03/11/2024. This over-read does not include interpretation of cardiac or coronary anatomy or pathology. The heart morphology CTA coronary and calcium  interpretation by the cardiologist is attached.  COMPARISON:  None.  FINDINGS: Atherosclerotic plaque.  Aortic valve leaflet calcification.  Left upper lobe pulmonary micronodule (307:11).  No lymphadenopathy.  Fluid density lesions within the liver likely represent simple hepatic cysts.  No acute osseous abnormality.  No acute soft tissue abnormality.  Bilateral gynecomastia.  IMPRESSION: 1. Less than 6 mm left solid pulmonary nodule within the upper lobe. Fleischner criteria are not applicable for immunocompromised patients or patients with cancer history. For those patients, provide follow-up management at your discretion. For lung cancer screening, adhere to Lung-RADS guidelines. These guidelines do not apply to immunocompromised  patients and patients with cancer. Follow up in patients with significant comorbidities as clinically warranted. For lung cancer screening, adhere to Lung-RADS guidelines. Reference: Radiology. 2017; 284(1):228-43. 2. Aortic Atherosclerosis (ICD10-I70.0), aortic valve leaflet calcification-correlate for aortic stenosis.   Electronically Signed By: Morgane  Naveau M.D. On: 03/11/2024 22:11  Narrative CLINICAL DATA:  58M with severe aortic stenosis being evaluated for a TAVR procedure.  EXAM: Cardiac TAVR CT  TECHNIQUE: A non-contrast, gated CT scan was obtained with axial slices of 2.5 mm through the heart for aortic valve scoring. A 120 kV retrospective, gated, contrast cardiac scan was obtained. Gantry rotation speed was 230 msec and collimation was 0.63 mm. Nitroglycerin was not given. A delayed scan was obtained to exclude left atrial appendage thrombus. The 3D dataset was reconstructed in systole with motion correction. The 3D data set was reconstructed in 5% intervals of the 0-95% of the R-R cycle. Systolic and diastolic phases were analyzed on a dedicated workstation using MPR, MIP, and VRT modes. The patient received 100 cc of contrast.  FINDINGS: Aortic Root:  Aortic valve: trileaflet  Aortic valve calcium  score: 1111  Aortic annulus:  Diameter: 26mm x 22mm  Perimeter: 76mm  Area: 442mm^2  Calcifications: No calcifications  Coronary height: Min Left - 13mm; Min Right - 18mm  Sinotubular height: Left cusp - 21mm; Right cusp - 23mm; Noncoronary cusp - 20mm  LVOT (as measured 3 mm below the annulus):  Diameter: 28mm x 22mm  Area: 447mm^2  Calcifications: No calcifications  Aortic sinus width: Left cusp - 30mm; Right cusp - 29mm; Noncoronary cusp - 32mm  Sinotubular junction width: 29mm x 28mm  Optimum Fluoroscopic Angle for Delivery: LAO 25 CAU 8  Cardiac:  Right atrium: Mild enlargement  Right ventricle: Moderate dilatation  Pulmonary  arteries: Normal size  Pulmonary veins: Normal configuration  Left atrium: Moderate enlargement  Left ventricle: Normal size  Pericardium: Normal thickness  Coronary arteries: Coronary calcium  score 33  IMPRESSION: 1. Tricuspid aortic valve with moderate calcifications (AV calcium  score 1111)  2. Aortic annulus measures 26mm x 22mm in diameter with perimeter 76mm and area 448 mm^2. No annular or LVOT calcifications. Annular measurements are suitable for delivery of a 26mm Edwards Sapien 3 valve  3.  Sufficient coronary to annulus distance.  4.  Optimum Fluoroscopic Angle for Delivery:  LAO 25 CAU 8  5.  Coronary calcium  score 33  Electronically Signed: By: Lonni Nanas M.D. On: 03/08/2024 17:35  ______________________________________________________________________________________________      ECG ***    I have independently reviewed the above radiologic studies and discussed with the patient   Recent Lab Findings: Lab Results  Component Value Date   WBC 8.8 03/11/2024   HGB 13.1 03/11/2024   HCT 41.4 03/11/2024   PLT 163 03/11/2024   GLUCOSE 112 (H) 03/11/2024   ALT 21 01/13/2024   AST 21 01/13/2024   NA 143 03/11/2024   K 4.3 03/11/2024   CL 109 03/11/2024   CREATININE 1.45 (H) 03/11/2024   BUN 13 03/11/2024   CO2 24 03/11/2024   INR 0.97 11/20/2013      Assessment / Plan:   89 y.o. male with severe aortic stenosis.  STS score: ***.  NYHA Class ***.  The risks and benefits of *** TAVR were discussed in detail.  We also discussed possibility of an emergent sternotomy to address any procedural complications.  Based on our discussion, we collectively decided that an emergent sternotomy would *** be indicated.  The patient is *** agreeable to proceed.  Based on my review of her LHC, echo, and CTA, I agree with the multidisciplinary plan to proceed with a *** TAVR.      I  spent {CHL ONC TIME VISIT - DTPQU:[id number]} counseling the  patient face to face.   Linnie MALVA Rayas 04/01/2024 7:58 AM

## 2024-04-02 ENCOUNTER — Encounter: Payer: Self-pay | Admitting: Cardiology

## 2024-04-03 ENCOUNTER — Telehealth: Payer: Self-pay | Admitting: Cardiology

## 2024-04-03 NOTE — Telephone Encounter (Signed)
 Please confirm that he is on Losartan  100mg  po qday, EMR and last ER note mentions 50mg  dose.   If maxed on losartan  may start hydralazine  10mg  po tid.   Avoid AV nodal agents given his baseline conduction disease.   IF he has lightheaded, dizziness, syncope/near syncope, CP, or heart failure symptoms please go to ER.   Saranya Harlin Dovray, DO, FACC

## 2024-04-03 NOTE — Telephone Encounter (Signed)
 Patient identification verified by 2 forms.   Called and spoke to patient's wife.  Patient's wife states:  -Pt has been on 100 mg daily for 1 week (tomorrow) (it was increased by PCP).   Patient's wife denies:  -Improvement of blood pressure with increase.   Interventions/Plan: -Will get message to Dr. Michele to verify he would like Losartan  discontinued and Hydralazine  10 mg three times daily send in.   Reviewed ED warning signs/precautions  Patient's wife agrees with plan, no questions at this time

## 2024-04-03 NOTE — Telephone Encounter (Signed)
 Pt's wife would like a c/b regarding MyChart message. Please advise

## 2024-04-04 MED ORDER — HYDRALAZINE HCL 10 MG PO TABS: 10.0000 mg | ORAL_TABLET | Freq: Three times a day (TID) | ORAL | 3 refills | Status: DC

## 2024-04-04 NOTE — Telephone Encounter (Signed)
 Spoke with Dr. Michele. Pt is to continue current medications and add Hydralazine .  Pt's wife informed of symptoms to look out for. She verbalized understanding, no further questions expressed at this time.

## 2024-04-04 NOTE — Telephone Encounter (Signed)
 Spoke with Dr. Michele yesterday. Pt is to start Hydralazine  10 mg three times daily.   Pt's wife starts he will start taking the Hydralazine  when they pick up the medication.   To clarify, pt is to take Hydralazine  in addition to current medications?

## 2024-04-04 NOTE — Telephone Encounter (Signed)
 Attempted to call pt.  Call went to voicemail.  Pt advised will send MyChart message regarding Dr Tyree question.

## 2024-04-04 NOTE — Telephone Encounter (Signed)
 Pt is requesting a callback regarding her stating she didn't get a callback once medication was called in to pharmacy like they discussed. Please advise.

## 2024-04-05 ENCOUNTER — Encounter: Admitting: Thoracic Surgery (Cardiothoracic Vascular Surgery)

## 2024-04-05 ENCOUNTER — Ambulatory Visit
Attending: Thoracic Surgery (Cardiothoracic Vascular Surgery) | Admitting: Thoracic Surgery (Cardiothoracic Vascular Surgery)

## 2024-04-05 VITALS — BP 143/83 | HR 70 | Resp 18 | Ht 66.0 in | Wt 150.0 lb

## 2024-04-05 DIAGNOSIS — I350 Nonrheumatic aortic (valve) stenosis: Secondary | ICD-10-CM

## 2024-04-05 NOTE — Progress Notes (Signed)
 [address]       [place] [address]             [phone]        [facility] Medical Record #[id number] Date of Birth: 10-Aug-1932  Referring: [name], MD Primary Care: [name], MD Primary Cardiologist:[name], DO  Chief Complaint:    Chief Complaint  Patient presents with   Aortic Stenosis    Review TAVR work up    History of Present Illness:     Tyrell Seifer is a 89 y.o. male presents for surgical evaluation of severe aortic stenosis.  He was noted to have low-flow low gradient aortic stenosis.  He also has a history of a first-degree AV block, and TIA.  Earlier this summer he was admitted following a syncopal event after having an episode of hematemesis.  He currently admits to some shortness of breath with exertion.      Past Medical History:  Diagnosis Date   Anxiety and depression    Depression    Diverticulosis    Family history of coronary artery disease    GERD (gastroesophageal reflux disease)    History of arm fracture    Rt arm, LT elbow fracture   History of colon polyps    History of ETOH abuse    Quit in 1980   Hypercholesterolemia    Hypertension    PONV (postoperative nausea and vomiting)    Severe aortic stenosis    TIA (transient ischemic attack) 12/04/2005    Past Surgical History:  Procedure Laterality Date   COLONOSCOPY     CORONARY PRESSURE/FFR WITH 3D MAPPING N/A 02/29/2024   Procedure: Coronary Pressure/FFR w/3D Mapping;  Surgeon: [name], MD;  Location: [facility] INVASIVE CV LAB;  Service: Cardiovascular;  Laterality: N/A;   ESOPHAGOGASTRODUODENOSCOPY N/A 01/13/2024   Procedure: EGD (ESOPHAGOGASTRODUODENOSCOPY);  Surgeon: [name], MD;  Location: [facility] ENDOSCOPY;  Service: Gastroenterology;  Laterality: N/A;   LEFT HEART CATH AND CORONARY ANGIOGRAPHY N/A 02/29/2024   Procedure: LEFT HEART CATH AND CORONARY ANGIOGRAPHY;  Surgeon: [name], MD;  Location: [facility]  INVASIVE CV LAB;  Service: Cardiovascular;  Laterality: N/A;   OPEN REDUCTION INTERNAL FIXATION (ORIF) DISTAL PHALANX Left 11/21/2013   Procedure: OPEN REDUCTION INTERNAL FIXATION (ORIF) DISTAL PHALANX;  Surgeon: [name], MD;  Location: [facility] OR;  Service: Orthopedics;  Laterality: Left;   ORIF WRIST FRACTURE Right 11/25/2020   Procedure: OPEN REDUCTION INTERNAL FIXATION WRIST FRACTURE;  Surgeon: [name], MD;  Location: [facility] OR;  Service: Orthopedics;  Laterality: Right;    Social History:  Social History   Tobacco Use  Smoking Status Former   Types: Pipe   Quit date: 1986   Years since quitting: 39.8  Smokeless Tobacco Never  Tobacco Comments   Quit smoking pipe years ago    Social History   Substance and Sexual Activity  Alcohol Use No     Allergies  Allergen Reactions   Keflex [Cephalexin] Hives, Diarrhea and Itching      Current Outpatient Medications  Medication Sig Dispense Refill   atorvastatin  (LIPITOR) 40 MG tablet Take 40 mg by mouth every evening.     buPROPion  (WELLBUTRIN  XL) 300 MG 24 hr tablet Take 300 mg by mouth daily.     CALCIUM  PO Take 1 tablet by mouth daily. Take one tablet by mouth daily.     Cholecalciferol (VITAMIN D) 125 MCG (5000 UT) CAPS Take 5,000  Units by mouth daily.     hydrALAZINE  (APRESOLINE ) 10 MG tablet Take 1 tablet (10 mg total) by mouth 3 (three) times daily. 270 tablet 3   losartan  (COZAAR ) 100 MG tablet Take 100 mg by mouth daily.     Multiple Vitamin (MULTIVITAMIN WITH MINERALS) TABS tablet Take 1 tablet by mouth daily.     pantoprazole  (PROTONIX ) 40 MG tablet Take 1 tablet (40 mg total) by mouth 2 (two) times daily before a meal. 180 tablet 0   sertraline  (ZOLOFT ) 50 MG tablet Take 50 mg by mouth every evening.     No current facility-administered medications for this visit.    (Not in a hospital admission)   Family History  Problem Relation Age of Onset   Heart disease Mother    Heart disease Father         Deceased 15 ( Had MI )     Review of Systems:   Review of Systems  Constitutional:  Positive for malaise/fatigue.  Respiratory:  Positive for shortness of breath.   Cardiovascular:  Negative for chest pain.  Neurological:  Negative for dizziness.      Physical Exam: BP (!) 143/83   Pulse 70   Resp 18   Ht 5' 6 (1.676 m)   Wt 150 lb (68 kg)   SpO2 95%   BMI 24.21 kg/m  Physical Exam Constitutional:      General: He is not in acute distress.    Appearance: He is not ill-appearing.  HENT:     Head: Normocephalic and atraumatic.  Eyes:     Extraocular Movements: Extraocular movements intact.  Cardiovascular:     Rate and Rhythm: Normal rate.  Pulmonary:     Effort: Pulmonary effort is normal. No respiratory distress.  Abdominal:     General: Abdomen is flat. There is no distension.  Musculoskeletal:        General: Normal range of motion.     Cervical back: Normal range of motion.  Skin:    General: Skin is warm and dry.  Neurological:     General: No focal deficit present.     Mental Status: He is alert and oriented to person, place, and time.       Cardiac Studies & Procedures   ______________________________________________________________________________________________ CARDIAC CATHETERIZATION  CARDIAC CATHETERIZATION 02/29/2024  Conclusion 1.  Moderate nonobstructive LAD stenosis of 50% with negative cath works angio FFR of 0.87 2.  Widely patent left main, left circumflex, and RCA with no significant stenoses 3.  Known severe aortic stenosis  Findings Coronary Findings Diagnostic  Dominance: Right  Left Main Vessel is angiographically normal. The left main is patent with no stenosis  Left Anterior Descending The LAD has a moderate 50% mid vessel lesion interrogated with cath works angio FFR which yielded a result of 0.87 (hemodynamically insignificant). Mid LAD lesion is 50% stenosed.  Left Circumflex Vessel is angiographically normal. The  circumflex is patent with no stenosis  Right Coronary Artery Vessel is angiographically normal. Moderate caliber vessel with no stenosis  Intervention  No interventions have been documented.     ECHOCARDIOGRAM  ECHOCARDIOGRAM COMPLETE 11/03/2023  Narrative ECHOCARDIOGRAM REPORT    Patient Name:   ADAEL CULBREATH Date of Exam: 11/03/2023 Medical Rec #:  [id number]           Height:       66.0 in Accession #:    [id number]          Weight:  130.0 lb Date of Birth:  Sep 07, 1932           BSA:          1.665 m Patient Age:    89 years            BP:           120/78 mmHg Patient Gender: M                   HR:           60 bpm. Exam Location:  [address]  Procedure: 2D Echo and 3D Echo (Both Spectral and Color Flow Doppler were utilized during procedure).  Indications:    I35.0 Nonrheumatic aortic (valve) stenosis  History:        Patient has prior history of Echocardiogram examinations, most recent 06/21/2023. TIA, Signs/Symptoms:Fatigue; Risk Factors:Dyslipidemia. 1st degree AV block. Exertional dyspnea. Subarachnoid hematoma.  Sonographer:    Jon Hacker RCS Referring Phys: [phone] MICHAEL COOPER  IMPRESSIONS   1. Left ventricular ejection fraction, by estimation, is 60 to 65%. Left ventricular ejection fraction by 3D volume is 61 %. The left ventricle has normal function. The left ventricle has no regional wall motion abnormalities. There is moderate asymmetric left ventricular hypertrophy of the basal-septal segment. Left ventricular diastolic parameters are indeterminate. 2. Right ventricular systolic function was not well visualized. The right ventricular size is not well visualized. Tricuspid regurgitation signal is inadequate for assessing PA pressure. 3. The mitral valve is normal in structure. Mild to moderate mitral valve regurgitation. No evidence of mitral stenosis. 4. The aortic valve is tricuspid. There is severe calcifcation of the aortic valve.  Aortic valve regurgitation is mild. Severe aortic valve stenosis. Mild AS by gradients (Vmax 2.6 m/s, MG ), but severe by AVA (0.7cm^2) and DI (0.22). Low SV index (26 cc/m^2), suspect paradoxical low flow low gradient severe AS  FINDINGS Left Ventricle: Left ventricular ejection fraction, by estimation, is 60 to 65%. Left ventricular ejection fraction by 3D volume is 61 %. The left ventricle has normal function. The left ventricle has no regional wall motion abnormalities. The left ventricular internal cavity size was normal in size. There is moderate asymmetric left ventricular hypertrophy of the basal-septal segment. Left ventricular diastolic parameters are indeterminate.  Right Ventricle: The right ventricular size is not well visualized. Right vetricular wall thickness was not well visualized. Right ventricular systolic function was not well visualized. Tricuspid regurgitation signal is inadequate for assessing PA pressure.  Left Atrium: Left atrial size was normal in size.  Right Atrium: Right atrial size was not well visualized.  Pericardium: There is no evidence of pericardial effusion.  Mitral Valve: The mitral valve is normal in structure. Mild to moderate mitral valve regurgitation. No evidence of mitral valve stenosis.  Tricuspid Valve: The tricuspid valve is normal in structure. Tricuspid valve regurgitation is trivial.  Aortic Valve: The aortic valve is tricuspid. There is severe calcifcation of the aortic valve. Aortic valve regurgitation is mild. Severe aortic stenosis is present. Aortic valve mean gradient measures 17.0 mmHg. Aortic valve peak gradient measures 26.4 mmHg. Aortic valve area, by VTI measures 0.68 cm.  Pulmonic Valve: The pulmonic valve was grossly normal. Pulmonic valve regurgitation is trivial.  Aorta: The aortic root and ascending aorta are structurally normal, with no evidence of dilitation.  IAS/Shunts: The interatrial septum was not well  visualized.  Additional Comments: 3D was performed not requiring image post processing on an independent workstation and was normal.  LEFT VENTRICLE PLAX 2D LVIDd:         4.24 cm         Diastology LVIDs:         2.80 cm         LV e' medial:    5.11 cm/s LV PW:         0.95 cm         LV E/e' medial:  16.5 LV IVS:        1.17 cm         LV e' lateral:   10.60 cm/s LVOT diam:     2.00 cm         LV E/e' lateral: 8.0 LV SV:         43 LV SV Index:   26 LVOT Area:     3.14 cm        3D Volume EF LV 3D EF:    Left ventricul ar ejection fraction by 3D volume is 61 %.  3D Volume EF: 3D EF:        61 % LV EDV:       139 ml LV ESV:       54 ml LV SV:        85 ml  RIGHT VENTRICLE RV S prime:     12.40 cm/s TAPSE (M-mode): 2.5 cm  LEFT ATRIUM           Index LA diam:      5.10 cm 3.06 cm/m LA Vol (A2C): 43.2 ml 25.94 ml/m LA Vol (A4C): 86.5 ml 51.94 ml/m AORTIC VALVE AV Area (Vmax):    0.66 cm AV Area (Vmean):   0.59 cm AV Area (VTI):     0.68 cm AV Vmax:           257.00 cm/s AV Vmean:          197.000 cm/s AV VTI:            0.636 m AV Peak Grad:      26.4 mmHg AV Mean Grad:      17.0 mmHg LVOT Vmax:         54.40 cm/s LVOT Vmean:        36.800 cm/s LVOT VTI:          0.137 m LVOT/AV VTI ratio: 0.22  AORTA Ao Root diam: 3.10 cm Ao Asc diam:  3.50 cm  MITRAL VALVE MV Area (PHT): 6.37 cm    SHUNTS MV Decel Time: 119 msec    Systemic VTI:  0.14 m MV E velocity: 84.40 cm/s  Systemic Diam: 2.00 cm MV A velocity: 84.40 cm/s MV E/A ratio:  1.00  Lonni Nanas MD Electronically signed by Lonni Nanas MD Signature Date/Time: 11/03/2023/2:49:06 PM    Final    MONITORS  LONG TERM MONITOR (3-14 DAYS) 10/04/2021  Narrative Cardiac monitor (Zio Patch): Patch Wear Time:  6 days and 23 hours Dominant rhythm sinus with first-degree AV block. Heart rate 30-102 bpm.  Avg HR 68 bpm. No atrial fibrillation, supraventricular tachycardia,  ventricular tachycardia, high grade AV block, pauses (3 seconds or longer). Minimum HR 30 bpm, asymptomatic, on 09/26/2021, at 11:20 PM, sinus with second-degree type I AV block. Total ventricular ectopic burden <1%. Total supraventricular ectopic burden 0%. Patient triggered events: 0.   CT SCANS  CT CORONARY MORPH W/CTA COR W/SCORE 03/08/2024  Addendum 03/11/2024 10:13 PM ADDENDUM REPORT: 03/11/2024 22:11  EXAM: OVER-READ INTERPRETATION  CT CHEST  The following  report is an over-read performed by radiologist Dr. Morgane Naveauof [facility] on 03/11/2024. This over-read does not include interpretation of cardiac or coronary anatomy or pathology. The heart morphology CTA coronary and calcium  interpretation by the cardiologist is attached.  COMPARISON:  None.  FINDINGS: Atherosclerotic plaque.  Aortic valve leaflet calcification.  Left upper lobe pulmonary micronodule (307:11).  No lymphadenopathy.  Fluid density lesions within the liver likely represent simple hepatic cysts.  No acute osseous abnormality.  No acute soft tissue abnormality.  Bilateral gynecomastia.  IMPRESSION: 1. Less than 6 mm left solid pulmonary nodule within the upper lobe. Fleischner criteria are not applicable for immunocompromised patients or patients with cancer history. For those patients, provide follow-up management at your discretion. For lung cancer screening, adhere to Lung-RADS guidelines. These guidelines do not apply to immunocompromised patients and patients with cancer. Follow up in patients with significant comorbidities as clinically warranted. For lung cancer screening, adhere to Lung-RADS guidelines. Reference: Radiology. 2017; 284(1):228-43. 2. Aortic Atherosclerosis (ICD10-I70.0), aortic valve leaflet calcification-correlate for aortic stenosis.   Electronically Signed By: Morgane  Naveau M.D. On: 03/11/2024 22:11  Narrative CLINICAL DATA:  47M with severe  aortic stenosis being evaluated for a TAVR procedure.  EXAM: Cardiac TAVR CT  TECHNIQUE: A non-contrast, gated CT scan was obtained with axial slices of 2.5 mm through the heart for aortic valve scoring. A 120 kV retrospective, gated, contrast cardiac scan was obtained. Gantry rotation speed was 230 msec and collimation was 0.63 mm. Nitroglycerin was not given. A delayed scan was obtained to exclude left atrial appendage thrombus. The 3D dataset was reconstructed in systole with motion correction. The 3D data set was reconstructed in 5% intervals of the 0-95% of the R-R cycle. Systolic and diastolic phases were analyzed on a dedicated workstation using MPR, MIP, and VRT modes. The patient received 100 cc of contrast.  FINDINGS: Aortic Root:  Aortic valve: trileaflet  Aortic valve calcium  score: 1111  Aortic annulus:  Diameter: 26mm x 22mm  Perimeter: 76mm  Area: 474mm^2  Calcifications: No calcifications  Coronary height: Min Left - 13mm; Min Right - 18mm  Sinotubular height: Left cusp - 21mm; Right cusp - 23mm; Noncoronary cusp - 20mm  LVOT (as measured 3 mm below the annulus):  Diameter: 28mm x 22mm  Area: 435mm^2  Calcifications: No calcifications  Aortic sinus width: Left cusp - 30mm; Right cusp - 29mm; Noncoronary cusp - 32mm  Sinotubular junction width: 29mm x 28mm  Optimum Fluoroscopic Angle for Delivery: LAO 25 CAU 8  Cardiac:  Right atrium: Mild enlargement  Right ventricle: Moderate dilatation  Pulmonary arteries: Normal size  Pulmonary veins: Normal configuration  Left atrium: Moderate enlargement  Left ventricle: Normal size  Pericardium: Normal thickness  Coronary arteries: Coronary calcium  score 33  IMPRESSION: 1. Tricuspid aortic valve with moderate calcifications (AV calcium  score 1111)  2. Aortic annulus measures 26mm x 22mm in diameter with perimeter 76mm and area 448 mm^2. No annular or LVOT calcifications.  Annular measurements are suitable for delivery of a 26mm Edwards Sapien 3 valve  3.  Sufficient coronary to annulus distance.  4.  Optimum Fluoroscopic Angle for Delivery:  LAO 25 CAU 8  5.  Coronary calcium  score 33  Electronically Signed: By: Lonni Nanas M.D. On: 03/08/2024 17:35     ______________________________________________________________________________________________      ECG Normal sinus rhythm with first-degree AV block.    I have independently reviewed the above radiologic studies and discussed with the patient  Recent Lab Findings: Lab Results  Component Value Date   WBC 8.8 03/11/2024   HGB 13.1 03/11/2024   HCT 41.4 03/11/2024   PLT 163 03/11/2024   GLUCOSE 112 (H) 03/11/2024   ALT 21 01/13/2024   AST 21 01/13/2024   NA 143 03/11/2024   K 4.3 03/11/2024   CL 109 03/11/2024   CREATININE 1.45 (H) 03/11/2024   BUN 13 03/11/2024   CO2 24 03/11/2024   INR 0.97 11/20/2013      Assessment / Plan:   89 y.o. male with severe aortic stenosis.  STS score: 5.38.  NYHA Class III.  The risks and benefits of transfemoral TAVR were discussed in detail.  We also discussed possibility of an emergent sternotomy to address any procedural complications.  Based on our discussion, we collectively decided that an emergent sternotomy would not be indicated.  The patient is agreeable to proceed.  Based on my review of her LHC, echo, and CTA, I agree with the multidisciplinary plan to proceed with a 26mm S3 TAVR.      I  spent 40 minutes counseling the patient face to face.   Linnie MALVA Rayas 04/05/2024 2:56 PM

## 2024-04-05 NOTE — H&P (View-Only) (Signed)
 [address]       [place] [address]             [phone]        [facility] Medical Record #[id number] Date of Birth: 10-Aug-1932  Referring: [name], MD Primary Care: [name], MD Primary Cardiologist:[name], DO  Chief Complaint:    Chief Complaint  Patient presents with   Aortic Stenosis    Review TAVR work up    History of Present Illness:     Peter Gallegos is a 89 y.o. male presents for surgical evaluation of severe aortic stenosis.  He was noted to have low-flow low gradient aortic stenosis.  He also has a history of a first-degree AV block, and TIA.  Earlier this summer he was admitted following a syncopal event after having an episode of hematemesis.  He currently admits to some shortness of breath with exertion.      Past Medical History:  Diagnosis Date   Anxiety and depression    Depression    Diverticulosis    Family history of coronary artery disease    GERD (gastroesophageal reflux disease)    History of arm fracture    Rt arm, LT elbow fracture   History of colon polyps    History of ETOH abuse    Quit in 1980   Hypercholesterolemia    Hypertension    PONV (postoperative nausea and vomiting)    Severe aortic stenosis    TIA (transient ischemic attack) 12/04/2005    Past Surgical History:  Procedure Laterality Date   COLONOSCOPY     CORONARY PRESSURE/FFR WITH 3D MAPPING N/A 02/29/2024   Procedure: Coronary Pressure/FFR w/3D Mapping;  Surgeon: [name], MD;  Location: [facility] INVASIVE CV LAB;  Service: Cardiovascular;  Laterality: N/A;   ESOPHAGOGASTRODUODENOSCOPY N/A 01/13/2024   Procedure: EGD (ESOPHAGOGASTRODUODENOSCOPY);  Surgeon: [name], MD;  Location: [facility] ENDOSCOPY;  Service: Gastroenterology;  Laterality: N/A;   LEFT HEART CATH AND CORONARY ANGIOGRAPHY N/A 02/29/2024   Procedure: LEFT HEART CATH AND CORONARY ANGIOGRAPHY;  Surgeon: [name], MD;  Location: [facility]  INVASIVE CV LAB;  Service: Cardiovascular;  Laterality: N/A;   OPEN REDUCTION INTERNAL FIXATION (ORIF) DISTAL PHALANX Left 11/21/2013   Procedure: OPEN REDUCTION INTERNAL FIXATION (ORIF) DISTAL PHALANX;  Surgeon: [name], MD;  Location: [facility] OR;  Service: Orthopedics;  Laterality: Left;   ORIF WRIST FRACTURE Right 11/25/2020   Procedure: OPEN REDUCTION INTERNAL FIXATION WRIST FRACTURE;  Surgeon: [name], MD;  Location: [facility] OR;  Service: Orthopedics;  Laterality: Right;    Social History:  Social History   Tobacco Use  Smoking Status Former   Types: Pipe   Quit date: 1986   Years since quitting: 39.8  Smokeless Tobacco Never  Tobacco Comments   Quit smoking pipe years ago    Social History   Substance and Sexual Activity  Alcohol Use No     Allergies  Allergen Reactions   Keflex [Cephalexin] Hives, Diarrhea and Itching      Current Outpatient Medications  Medication Sig Dispense Refill   atorvastatin  (LIPITOR) 40 MG tablet Take 40 mg by mouth every evening.     buPROPion  (WELLBUTRIN  XL) 300 MG 24 hr tablet Take 300 mg by mouth daily.     CALCIUM  PO Take 1 tablet by mouth daily. Take one tablet by mouth daily.     Cholecalciferol (VITAMIN D) 125 MCG (5000 UT) CAPS Take 5,000  Units by mouth daily.     hydrALAZINE  (APRESOLINE ) 10 MG tablet Take 1 tablet (10 mg total) by mouth 3 (three) times daily. 270 tablet 3   losartan  (COZAAR ) 100 MG tablet Take 100 mg by mouth daily.     Multiple Vitamin (MULTIVITAMIN WITH MINERALS) TABS tablet Take 1 tablet by mouth daily.     pantoprazole  (PROTONIX ) 40 MG tablet Take 1 tablet (40 mg total) by mouth 2 (two) times daily before a meal. 180 tablet 0   sertraline  (ZOLOFT ) 50 MG tablet Take 50 mg by mouth every evening.     No current facility-administered medications for this visit.    (Not in a hospital admission)   Family History  Problem Relation Age of Onset   Heart disease Mother    Heart disease Father         Deceased 15 ( Had MI )     Review of Systems:   Review of Systems  Constitutional:  Positive for malaise/fatigue.  Respiratory:  Positive for shortness of breath.   Cardiovascular:  Negative for chest pain.  Neurological:  Negative for dizziness.      Physical Exam: BP (!) 143/83   Pulse 70   Resp 18   Ht 5' 6 (1.676 m)   Wt 150 lb (68 kg)   SpO2 95%   BMI 24.21 kg/m  Physical Exam Constitutional:      General: He is not in acute distress.    Appearance: He is not ill-appearing.  HENT:     Head: Normocephalic and atraumatic.  Eyes:     Extraocular Movements: Extraocular movements intact.  Cardiovascular:     Rate and Rhythm: Normal rate.  Pulmonary:     Effort: Pulmonary effort is normal. No respiratory distress.  Abdominal:     General: Abdomen is flat. There is no distension.  Musculoskeletal:        General: Normal range of motion.     Cervical back: Normal range of motion.  Skin:    General: Skin is warm and dry.  Neurological:     General: No focal deficit present.     Mental Status: He is alert and oriented to person, place, and time.       Cardiac Studies & Procedures   ______________________________________________________________________________________________ CARDIAC CATHETERIZATION  CARDIAC CATHETERIZATION 02/29/2024  Conclusion 1.  Moderate nonobstructive LAD stenosis of 50% with negative cath works angio FFR of 0.87 2.  Widely patent left main, left circumflex, and RCA with no significant stenoses 3.  Known severe aortic stenosis  Findings Coronary Findings Diagnostic  Dominance: Right  Left Main Vessel is angiographically normal. The left main is patent with no stenosis  Left Anterior Descending The LAD has a moderate 50% mid vessel lesion interrogated with cath works angio FFR which yielded a result of 0.87 (hemodynamically insignificant). Mid LAD lesion is 50% stenosed.  Left Circumflex Vessel is angiographically normal. The  circumflex is patent with no stenosis  Right Coronary Artery Vessel is angiographically normal. Moderate caliber vessel with no stenosis  Intervention  No interventions have been documented.     ECHOCARDIOGRAM  ECHOCARDIOGRAM COMPLETE 11/03/2023  Narrative ECHOCARDIOGRAM REPORT    Patient Name:   Peter Gallegos Date of Exam: 11/03/2023 Medical Rec #:  [id number]           Height:       66.0 in Accession #:    [id number]          Weight:  130.0 lb Date of Birth:  Sep 07, 1932           BSA:          1.665 m Patient Age:    89 years            BP:           120/78 mmHg Patient Gender: M                   HR:           60 bpm. Exam Location:  [address]  Procedure: 2D Echo and 3D Echo (Both Spectral and Color Flow Doppler were utilized during procedure).  Indications:    I35.0 Nonrheumatic aortic (valve) stenosis  History:        Patient has prior history of Echocardiogram examinations, most recent 06/21/2023. TIA, Signs/Symptoms:Fatigue; Risk Factors:Dyslipidemia. 1st degree AV block. Exertional dyspnea. Subarachnoid hematoma.  Sonographer:    Jon Hacker RCS Referring Phys: [phone] MICHAEL COOPER  IMPRESSIONS   1. Left ventricular ejection fraction, by estimation, is 60 to 65%. Left ventricular ejection fraction by 3D volume is 61 %. The left ventricle has normal function. The left ventricle has no regional wall motion abnormalities. There is moderate asymmetric left ventricular hypertrophy of the basal-septal segment. Left ventricular diastolic parameters are indeterminate. 2. Right ventricular systolic function was not well visualized. The right ventricular size is not well visualized. Tricuspid regurgitation signal is inadequate for assessing PA pressure. 3. The mitral valve is normal in structure. Mild to moderate mitral valve regurgitation. No evidence of mitral stenosis. 4. The aortic valve is tricuspid. There is severe calcifcation of the aortic valve.  Aortic valve regurgitation is mild. Severe aortic valve stenosis. Mild AS by gradients (Vmax 2.6 m/s, MG ), but severe by AVA (0.7cm^2) and DI (0.22). Low SV index (26 cc/m^2), suspect paradoxical low flow low gradient severe AS  FINDINGS Left Ventricle: Left ventricular ejection fraction, by estimation, is 60 to 65%. Left ventricular ejection fraction by 3D volume is 61 %. The left ventricle has normal function. The left ventricle has no regional wall motion abnormalities. The left ventricular internal cavity size was normal in size. There is moderate asymmetric left ventricular hypertrophy of the basal-septal segment. Left ventricular diastolic parameters are indeterminate.  Right Ventricle: The right ventricular size is not well visualized. Right vetricular wall thickness was not well visualized. Right ventricular systolic function was not well visualized. Tricuspid regurgitation signal is inadequate for assessing PA pressure.  Left Atrium: Left atrial size was normal in size.  Right Atrium: Right atrial size was not well visualized.  Pericardium: There is no evidence of pericardial effusion.  Mitral Valve: The mitral valve is normal in structure. Mild to moderate mitral valve regurgitation. No evidence of mitral valve stenosis.  Tricuspid Valve: The tricuspid valve is normal in structure. Tricuspid valve regurgitation is trivial.  Aortic Valve: The aortic valve is tricuspid. There is severe calcifcation of the aortic valve. Aortic valve regurgitation is mild. Severe aortic stenosis is present. Aortic valve mean gradient measures 17.0 mmHg. Aortic valve peak gradient measures 26.4 mmHg. Aortic valve area, by VTI measures 0.68 cm.  Pulmonic Valve: The pulmonic valve was grossly normal. Pulmonic valve regurgitation is trivial.  Aorta: The aortic root and ascending aorta are structurally normal, with no evidence of dilitation.  IAS/Shunts: The interatrial septum was not well  visualized.  Additional Comments: 3D was performed not requiring image post processing on an independent workstation and was normal.  LEFT VENTRICLE PLAX 2D LVIDd:         4.24 cm         Diastology LVIDs:         2.80 cm         LV e' medial:    5.11 cm/s LV PW:         0.95 cm         LV E/e' medial:  16.5 LV IVS:        1.17 cm         LV e' lateral:   10.60 cm/s LVOT diam:     2.00 cm         LV E/e' lateral: 8.0 LV SV:         43 LV SV Index:   26 LVOT Area:     3.14 cm        3D Volume EF LV 3D EF:    Left ventricul ar ejection fraction by 3D volume is 61 %.  3D Volume EF: 3D EF:        61 % LV EDV:       139 ml LV ESV:       54 ml LV SV:        85 ml  RIGHT VENTRICLE RV S prime:     12.40 cm/s TAPSE (M-mode): 2.5 cm  LEFT ATRIUM           Index LA diam:      5.10 cm 3.06 cm/m LA Vol (A2C): 43.2 ml 25.94 ml/m LA Vol (A4C): 86.5 ml 51.94 ml/m AORTIC VALVE AV Area (Vmax):    0.66 cm AV Area (Vmean):   0.59 cm AV Area (VTI):     0.68 cm AV Vmax:           257.00 cm/s AV Vmean:          197.000 cm/s AV VTI:            0.636 m AV Peak Grad:      26.4 mmHg AV Mean Grad:      17.0 mmHg LVOT Vmax:         54.40 cm/s LVOT Vmean:        36.800 cm/s LVOT VTI:          0.137 m LVOT/AV VTI ratio: 0.22  AORTA Ao Root diam: 3.10 cm Ao Asc diam:  3.50 cm  MITRAL VALVE MV Area (PHT): 6.37 cm    SHUNTS MV Decel Time: 119 msec    Systemic VTI:  0.14 m MV E velocity: 84.40 cm/s  Systemic Diam: 2.00 cm MV A velocity: 84.40 cm/s MV E/A ratio:  1.00  Lonni Nanas MD Electronically signed by Lonni Nanas MD Signature Date/Time: 11/03/2023/2:49:06 PM    Final    MONITORS  LONG TERM MONITOR (3-14 DAYS) 10/04/2021  Narrative Cardiac monitor (Zio Patch): Patch Wear Time:  6 days and 23 hours Dominant rhythm sinus with first-degree AV block. Heart rate 30-102 bpm.  Avg HR 68 bpm. No atrial fibrillation, supraventricular tachycardia,  ventricular tachycardia, high grade AV block, pauses (3 seconds or longer). Minimum HR 30 bpm, asymptomatic, on 09/26/2021, at 11:20 PM, sinus with second-degree type I AV block. Total ventricular ectopic burden <1%. Total supraventricular ectopic burden 0%. Patient triggered events: 0.   CT SCANS  CT CORONARY MORPH W/CTA COR W/SCORE 03/08/2024  Addendum 03/11/2024 10:13 PM ADDENDUM REPORT: 03/11/2024 22:11  EXAM: OVER-READ INTERPRETATION  CT CHEST  The following  report is an over-read performed by radiologist Dr. Morgane Naveauof [facility] on 03/11/2024. This over-read does not include interpretation of cardiac or coronary anatomy or pathology. The heart morphology CTA coronary and calcium  interpretation by the cardiologist is attached.  COMPARISON:  None.  FINDINGS: Atherosclerotic plaque.  Aortic valve leaflet calcification.  Left upper lobe pulmonary micronodule (307:11).  No lymphadenopathy.  Fluid density lesions within the liver likely represent simple hepatic cysts.  No acute osseous abnormality.  No acute soft tissue abnormality.  Bilateral gynecomastia.  IMPRESSION: 1. Less than 6 mm left solid pulmonary nodule within the upper lobe. Fleischner criteria are not applicable for immunocompromised patients or patients with cancer history. For those patients, provide follow-up management at your discretion. For lung cancer screening, adhere to Lung-RADS guidelines. These guidelines do not apply to immunocompromised patients and patients with cancer. Follow up in patients with significant comorbidities as clinically warranted. For lung cancer screening, adhere to Lung-RADS guidelines. Reference: Radiology. 2017; 284(1):228-43. 2. Aortic Atherosclerosis (ICD10-I70.0), aortic valve leaflet calcification-correlate for aortic stenosis.   Electronically Signed By: Morgane  Naveau M.D. On: 03/11/2024 22:11  Narrative CLINICAL DATA:  47M with severe  aortic stenosis being evaluated for a TAVR procedure.  EXAM: Cardiac TAVR CT  TECHNIQUE: A non-contrast, gated CT scan was obtained with axial slices of 2.5 mm through the heart for aortic valve scoring. A 120 kV retrospective, gated, contrast cardiac scan was obtained. Gantry rotation speed was 230 msec and collimation was 0.63 mm. Nitroglycerin was not given. A delayed scan was obtained to exclude left atrial appendage thrombus. The 3D dataset was reconstructed in systole with motion correction. The 3D data set was reconstructed in 5% intervals of the 0-95% of the R-R cycle. Systolic and diastolic phases were analyzed on a dedicated workstation using MPR, MIP, and VRT modes. The patient received 100 cc of contrast.  FINDINGS: Aortic Root:  Aortic valve: trileaflet  Aortic valve calcium  score: 1111  Aortic annulus:  Diameter: 26mm x 22mm  Perimeter: 76mm  Area: 474mm^2  Calcifications: No calcifications  Coronary height: Min Left - 13mm; Min Right - 18mm  Sinotubular height: Left cusp - 21mm; Right cusp - 23mm; Noncoronary cusp - 20mm  LVOT (as measured 3 mm below the annulus):  Diameter: 28mm x 22mm  Area: 435mm^2  Calcifications: No calcifications  Aortic sinus width: Left cusp - 30mm; Right cusp - 29mm; Noncoronary cusp - 32mm  Sinotubular junction width: 29mm x 28mm  Optimum Fluoroscopic Angle for Delivery: LAO 25 CAU 8  Cardiac:  Right atrium: Mild enlargement  Right ventricle: Moderate dilatation  Pulmonary arteries: Normal size  Pulmonary veins: Normal configuration  Left atrium: Moderate enlargement  Left ventricle: Normal size  Pericardium: Normal thickness  Coronary arteries: Coronary calcium  score 33  IMPRESSION: 1. Tricuspid aortic valve with moderate calcifications (AV calcium  score 1111)  2. Aortic annulus measures 26mm x 22mm in diameter with perimeter 76mm and area 448 mm^2. No annular or LVOT calcifications.  Annular measurements are suitable for delivery of a 26mm Edwards Sapien 3 valve  3.  Sufficient coronary to annulus distance.  4.  Optimum Fluoroscopic Angle for Delivery:  LAO 25 CAU 8  5.  Coronary calcium  score 33  Electronically Signed: By: Lonni Nanas M.D. On: 03/08/2024 17:35     ______________________________________________________________________________________________      ECG Normal sinus rhythm with first-degree AV block.    I have independently reviewed the above radiologic studies and discussed with the patient  Recent Lab Findings: Lab Results  Component Value Date   WBC 8.8 03/11/2024   HGB 13.1 03/11/2024   HCT 41.4 03/11/2024   PLT 163 03/11/2024   GLUCOSE 112 (H) 03/11/2024   ALT 21 01/13/2024   AST 21 01/13/2024   NA 143 03/11/2024   K 4.3 03/11/2024   CL 109 03/11/2024   CREATININE 1.45 (H) 03/11/2024   BUN 13 03/11/2024   CO2 24 03/11/2024   INR 0.97 11/20/2013      Assessment / Plan:   89 y.o. male with severe aortic stenosis.  STS score: 5.38.  NYHA Class III.  The risks and benefits of transfemoral TAVR were discussed in detail.  We also discussed possibility of an emergent sternotomy to address any procedural complications.  Based on our discussion, we collectively decided that an emergent sternotomy would not be indicated.  The patient is agreeable to proceed.  Based on my review of her LHC, echo, and CTA, I agree with the multidisciplinary plan to proceed with a 26mm S3 TAVR.      I  spent 40 minutes counseling the patient face to face.   Linnie MALVA Rayas 04/05/2024 2:56 PM

## 2024-04-08 ENCOUNTER — Other Ambulatory Visit: Payer: Self-pay

## 2024-04-08 DIAGNOSIS — I350 Nonrheumatic aortic (valve) stenosis: Secondary | ICD-10-CM

## 2024-04-08 NOTE — Telephone Encounter (Signed)
 Agree, continue hydralazine  10 mg p.o. 3 times daily (new start) and losartan  100 mg p.o. daily.  Dr. Geni Skorupski

## 2024-04-10 ENCOUNTER — Other Ambulatory Visit: Payer: Self-pay | Admitting: Physician Assistant

## 2024-04-10 ENCOUNTER — Telehealth: Payer: Self-pay | Admitting: Internal Medicine

## 2024-04-10 NOTE — Telephone Encounter (Signed)
 Gastric ulcer has been adequately treated Can stop pantoprazole  if using no NSAIDs Ok for 81 mg ASA, but if higher dose ASA or any NSAID would remain on pantoprazole  for gastric protection in setting of ulcer disease history EGD remains recommended for EMR of duodenal polyp (adenoma) with Dr. Wilhelmenia Thanks JMP

## 2024-04-10 NOTE — Telephone Encounter (Signed)
 Pt had an appt scheduled with Dr. CAFFIE to discuss possible EMR but had to cancel due to having an aortic valve replacement the same day. Wife states they saw PCP and he wanted to know why the pt is still taking protonix  bid as it can cause issues with bone density. Pt was taking protonix  after gi bleed and he is also not taking anything to help with bone density. Please advise thoughts on pantoprazole  and if pt should still be taking med. Wife knows Jan schedule will be out soon and we can then  reschedule the appt with Dr. CAFFIE.

## 2024-04-10 NOTE — Telephone Encounter (Signed)
Inbound call from patients wife stating husband is having surgery so is going to have to cancel follow up appointment with Dr.Mansourat on 04/23/2024. Wife is not sure why he was seeing a different Provider since he is a Dr.Pyrtle patient but would also like to speak to nurse due to PCP advising them to get clarification on why patient is still taking pantoprazole  if medication is to be taken only for 4-6 weeks and medication affects bones.  Patients wife requesting a call back  Please advise  Thank you

## 2024-04-10 NOTE — Telephone Encounter (Signed)
 Spoke with pts wife and she is aware of recommendations per Dr. Albertus. Will call pt back to schedule appt with Dr. CAFFIE when Jan schedule available.

## 2024-04-11 NOTE — Telephone Encounter (Signed)
 Pt rescheduled to see Dr. CAFFIE to discuss EMR 06/14/24@1 :30pm.   Left message for pt to call back.

## 2024-04-12 ENCOUNTER — Encounter (HOSPITAL_COMMUNITY)
Admission: RE | Admit: 2024-04-12 | Discharge: 2024-04-12 | Disposition: A | Discharge status: Home / Self Care | Source: Ambulatory Visit | Attending: Internal Medicine | Admitting: Internal Medicine

## 2024-04-12 ENCOUNTER — Other Ambulatory Visit: Payer: Self-pay

## 2024-04-12 ENCOUNTER — Ambulatory Visit (HOSPITAL_COMMUNITY)
Admission: RE | Admit: 2024-04-12 | Discharge: 2024-04-12 | Disposition: A | Discharge status: Home / Self Care | Source: Ambulatory Visit | Attending: Internal Medicine | Admitting: Internal Medicine

## 2024-04-12 DIAGNOSIS — I350 Nonrheumatic aortic (valve) stenosis: Secondary | ICD-10-CM | POA: Insufficient documentation

## 2024-04-12 DIAGNOSIS — I441 Atrioventricular block, second degree: Secondary | ICD-10-CM | POA: Insufficient documentation

## 2024-04-12 DIAGNOSIS — Z01818 Encounter for other preprocedural examination: Secondary | ICD-10-CM | POA: Insufficient documentation

## 2024-04-12 LAB — CBC
HCT: 46.7 % (ref 39.0–52.0)
Hemoglobin: 15 g/dL (ref 13.0–17.0)
MCH: 29.8 pg (ref 26.0–34.0)
MCHC: 32.1 g/dL (ref 30.0–36.0)
MCV: 92.8 fL (ref 80.0–100.0)
Platelets: 155 K/uL (ref 150–400)
RBC: 5.03 MIL/uL (ref 4.22–5.81)
RDW: 13.8 % (ref 11.5–15.5)
WBC: 7.6 K/uL (ref 4.0–10.5)
nRBC: 0 % (ref 0.0–0.2)

## 2024-04-12 LAB — COMPREHENSIVE METABOLIC PANEL WITH GFR
ALT: 46 U/L — ABNORMAL HIGH (ref 0–44)
AST: 45 U/L — ABNORMAL HIGH (ref 15–41)
Albumin: 3.7 g/dL (ref 3.5–5.0)
Alkaline Phosphatase: 67 U/L (ref 38–126)
Anion gap: 12 (ref 5–15)
BUN: 11 mg/dL (ref 8–23)
CO2: 27 mmol/L (ref 22–32)
Calcium: 9.2 mg/dL (ref 8.9–10.3)
Chloride: 104 mmol/L (ref 98–111)
Creatinine, Ser: 1.38 mg/dL — ABNORMAL HIGH (ref 0.61–1.24)
GFR, Estimated: 49 mL/min — ABNORMAL LOW (ref >60)
Glucose, Bld: 94 mg/dL (ref 70–99)
Potassium: 4 mmol/L (ref 3.5–5.1)
Sodium: 143 mmol/L (ref 135–145)
Total Bilirubin: 0.7 mg/dL (ref 0.0–1.2)
Total Protein: 6.3 g/dL — ABNORMAL LOW (ref 6.5–8.1)

## 2024-04-12 LAB — SURGICAL PCR SCREEN
MRSA, PCR: NEGATIVE
Staphylococcus aureus: NEGATIVE

## 2024-04-12 LAB — URINALYSIS, ROUTINE W REFLEX MICROSCOPIC
Bilirubin Urine: NEGATIVE
Glucose, UA: NEGATIVE mg/dL
Hgb urine dipstick: NEGATIVE
Ketones, ur: NEGATIVE mg/dL
Leukocytes,Ua: NEGATIVE
Nitrite: NEGATIVE
Protein, ur: NEGATIVE mg/dL
Specific Gravity, Urine: 1.013 (ref 1.005–1.030)
pH: 6 (ref 5.0–8.0)

## 2024-04-12 LAB — PROTIME-INR
INR: 1 (ref 0.8–1.2)
Prothrombin Time: 13.9 s (ref 11.4–15.2)

## 2024-04-12 NOTE — Telephone Encounter (Signed)
 Left detailed message on voicemail regarding new rescheduled appt for pt.

## 2024-04-12 NOTE — Progress Notes (Signed)
 All consents signed by patient at PAT lab appointment. Pt was sent home with printed copy of surgical instructions and CHG soap/CHG soap instructions. All instructions reviewed with patient and questions answered.  Patients chart send to anesthesia for review. Pt denies any respiratory illness/infection in the last two months.

## 2024-04-15 MED ORDER — DEXMEDETOMIDINE HCL IN NACL 400 MCG/100ML IV SOLN
0.1000 ug/kg/h | INTRAVENOUS | Status: AC
  Administered 2024-04-16: 1 ug/kg/h via INTRAVENOUS
  Filled 2024-04-15: qty 100

## 2024-04-15 MED ORDER — POTASSIUM CHLORIDE 2 MEQ/ML IV SOLN
80.0000 meq | INTRAVENOUS | Status: DC
  Filled 2024-04-15: qty 40

## 2024-04-15 MED ORDER — NOREPINEPHRINE 4 MG/250ML-% IV SOLN
0.0000 ug/min | INTRAVENOUS | Status: AC
  Administered 2024-04-16: 1 ug/min via INTRAVENOUS
  Filled 2024-04-15: qty 250

## 2024-04-15 MED ORDER — CEFAZOLIN SODIUM-DEXTROSE 2-4 GM/100ML-% IV SOLN
2.0000 g | INTRAVENOUS | Status: AC
  Administered 2024-04-16: 2 g via INTRAVENOUS
  Filled 2024-04-15: qty 100

## 2024-04-15 MED ORDER — MAGNESIUM SULFATE 50 % IJ SOLN
40.0000 meq | INTRAMUSCULAR | Status: DC
  Filled 2024-04-15: qty 9.85

## 2024-04-15 MED ORDER — HEPARIN 30,000 UNITS/1000 ML (OHS) CELLSAVER SOLUTION
Status: DC
  Filled 2024-04-15: qty 1000

## 2024-04-16 ENCOUNTER — Other Ambulatory Visit: Payer: Self-pay

## 2024-04-16 ENCOUNTER — Encounter (HOSPITAL_COMMUNITY): Admission: RE | Disposition: E | Payer: Self-pay | Source: Home / Self Care | Attending: Internal Medicine

## 2024-04-16 ENCOUNTER — Inpatient Hospital Stay (HOSPITAL_BASED_OUTPATIENT_CLINIC_OR_DEPARTMENT_OTHER): Admitting: Certified Registered Nurse Anesthetist

## 2024-04-16 ENCOUNTER — Inpatient Hospital Stay (HOSPITAL_COMMUNITY): Payer: Self-pay | Admitting: Physician Assistant

## 2024-04-16 ENCOUNTER — Inpatient Hospital Stay (HOSPITAL_COMMUNITY)
Admission: RE | Admit: 2024-04-16 | Discharge: 2024-05-06 | DRG: 266 | Disposition: E | Attending: Internal Medicine | Admitting: Internal Medicine

## 2024-04-16 ENCOUNTER — Encounter (HOSPITAL_COMMUNITY): Payer: Self-pay | Admitting: Internal Medicine

## 2024-04-16 ENCOUNTER — Inpatient Hospital Stay (HOSPITAL_COMMUNITY)

## 2024-04-16 ENCOUNTER — Ambulatory Visit: Admitting: Gastroenterology

## 2024-04-16 DIAGNOSIS — Z006 Encounter for examination for normal comparison and control in clinical research program: Secondary | ICD-10-CM

## 2024-04-16 DIAGNOSIS — K219 Gastro-esophageal reflux disease without esophagitis: Secondary | ICD-10-CM | POA: Diagnosis present

## 2024-04-16 DIAGNOSIS — I3139 Other pericardial effusion (noninflammatory): Secondary | ICD-10-CM | POA: Diagnosis not present

## 2024-04-16 DIAGNOSIS — I97418 Intraoperative hemorrhage and hematoma of a circulatory system organ or structure complicating other circulatory system procedure: Secondary | ICD-10-CM | POA: Diagnosis not present

## 2024-04-16 DIAGNOSIS — Z87891 Personal history of nicotine dependence: Secondary | ICD-10-CM | POA: Diagnosis not present

## 2024-04-16 DIAGNOSIS — I350 Nonrheumatic aortic (valve) stenosis: Secondary | ICD-10-CM

## 2024-04-16 DIAGNOSIS — I959 Hypotension, unspecified: Secondary | ICD-10-CM | POA: Diagnosis not present

## 2024-04-16 DIAGNOSIS — I110 Hypertensive heart disease with heart failure: Secondary | ICD-10-CM

## 2024-04-16 DIAGNOSIS — Z860100 Personal history of colon polyps, unspecified: Secondary | ICD-10-CM | POA: Diagnosis not present

## 2024-04-16 DIAGNOSIS — Z8249 Family history of ischemic heart disease and other diseases of the circulatory system: Secondary | ICD-10-CM | POA: Diagnosis not present

## 2024-04-16 DIAGNOSIS — F32A Depression, unspecified: Secondary | ICD-10-CM | POA: Diagnosis present

## 2024-04-16 DIAGNOSIS — I5033 Acute on chronic diastolic (congestive) heart failure: Secondary | ICD-10-CM

## 2024-04-16 DIAGNOSIS — I314 Cardiac tamponade: Secondary | ICD-10-CM

## 2024-04-16 DIAGNOSIS — E7800 Pure hypercholesterolemia, unspecified: Secondary | ICD-10-CM | POA: Diagnosis present

## 2024-04-16 DIAGNOSIS — I7409 Other arterial embolism and thrombosis of abdominal aorta: Secondary | ICD-10-CM | POA: Diagnosis not present

## 2024-04-16 DIAGNOSIS — Z8673 Personal history of transient ischemic attack (TIA), and cerebral infarction without residual deficits: Secondary | ICD-10-CM | POA: Diagnosis not present

## 2024-04-16 DIAGNOSIS — Z79899 Other long term (current) drug therapy: Secondary | ICD-10-CM | POA: Diagnosis not present

## 2024-04-16 DIAGNOSIS — R5381 Other malaise: Secondary | ICD-10-CM | POA: Diagnosis present

## 2024-04-16 DIAGNOSIS — I080 Rheumatic disorders of both mitral and aortic valves: Secondary | ICD-10-CM | POA: Diagnosis present

## 2024-04-16 DIAGNOSIS — R5383 Other fatigue: Secondary | ICD-10-CM | POA: Diagnosis present

## 2024-04-16 DIAGNOSIS — I713 Abdominal aortic aneurysm, ruptured, unspecified: Secondary | ICD-10-CM | POA: Diagnosis not present

## 2024-04-16 DIAGNOSIS — Y838 Other surgical procedures as the cause of abnormal reaction of the patient, or of later complication, without mention of misadventure at the time of the procedure: Secondary | ICD-10-CM | POA: Diagnosis not present

## 2024-04-16 DIAGNOSIS — F1011 Alcohol abuse, in remission: Secondary | ICD-10-CM | POA: Diagnosis present

## 2024-04-16 DIAGNOSIS — Z881 Allergy status to other antibiotic agents status: Secondary | ICD-10-CM | POA: Diagnosis not present

## 2024-04-16 DIAGNOSIS — I440 Atrioventricular block, first degree: Secondary | ICD-10-CM | POA: Diagnosis present

## 2024-04-16 HISTORY — PX: PERICARDIOCENTESIS: CATH118255

## 2024-04-16 HISTORY — PX: INTRAOPERATIVE TRANSTHORACIC ECHOCARDIOGRAM: SHX6523

## 2024-04-16 LAB — POCT I-STAT 7, (LYTES, BLD GAS, ICA,H+H)
Acid-base deficit: 9 mmol/L — ABNORMAL HIGH (ref 0.0–2.0)
Bicarbonate: 14.5 mmol/L — ABNORMAL LOW (ref 20.0–28.0)
Calcium, Ion: 1.81 mmol/L (ref 1.15–1.40)
HCT: 28 % — ABNORMAL LOW (ref 39.0–52.0)
Hemoglobin: 9.5 g/dL — ABNORMAL LOW (ref 13.0–17.0)
O2 Saturation: 100 %
Potassium: 3.6 mmol/L (ref 3.5–5.1)
Sodium: 150 mmol/L — ABNORMAL HIGH (ref 135–145)
TCO2: 15 mmol/L — ABNORMAL LOW (ref 22–32)
pCO2 arterial: 23.1 mmHg — ABNORMAL LOW (ref 32–48)
pH, Arterial: 7.407 (ref 7.35–7.45)
pO2, Arterial: 483 mmHg — ABNORMAL HIGH (ref 83–108)

## 2024-04-16 LAB — POCT ACTIVATED CLOTTING TIME
Activated Clotting Time: 101 s
Activated Clotting Time: 337 s

## 2024-04-16 LAB — ECHO TEE
AR max vel: 0.65 cm2
AV Area VTI: 0.63 cm2
AV Area mean vel: 0.55 cm2
AV Mean grad: 22 mmHg
AV Peak grad: 34.3 mmHg
Ao pk vel: 2.93 m/s
Area-P 1/2: 6.32 cm2
S' Lateral: 2.7 cm

## 2024-04-16 SURGERY — TRANSCATHETER AORTIC VALVE REPLACEMENT, TRANSFEMORAL (CATHLAB)
Anesthesia: General

## 2024-04-16 MED ORDER — HEPARIN SODIUM (PORCINE) 1000 UNIT/ML IJ SOLN
INTRAMUSCULAR | Status: DC | PRN
  Administered 2024-04-16: 10000 [IU] via INTRAVENOUS

## 2024-04-16 MED ORDER — CHLORHEXIDINE GLUCONATE 4 % EX SOLN: 60.0000 mL | Freq: Once | CUTANEOUS | Status: DC

## 2024-04-16 MED ORDER — CALCIUM CHLORIDE 10 % IV SOLN
INTRAVENOUS | Status: DC | PRN
  Administered 2024-04-16: 800 mg via INTRAVENOUS

## 2024-04-16 MED ORDER — PROTAMINE SULFATE 10 MG/ML IV SOLN
INTRAVENOUS | Status: DC | PRN
  Administered 2024-04-16: 100 mg via INTRAVENOUS

## 2024-04-16 MED ORDER — ROCURONIUM BROMIDE 10 MG/ML (PF) SYRINGE
PREFILLED_SYRINGE | INTRAVENOUS | Status: DC | PRN
  Administered 2024-04-16: 100 mg via INTRAVENOUS

## 2024-04-16 MED ORDER — IODIXANOL 320 MG/ML IV SOLN
INTRAVENOUS | Status: DC | PRN
  Administered 2024-04-16: 55 mL

## 2024-04-16 MED ORDER — EPINEPHRINE 1 MG/10ML IV SOSY
PREFILLED_SYRINGE | INTRAVENOUS | Status: DC | PRN
  Administered 2024-04-16 (×6): 1 mg via INTRAVENOUS

## 2024-04-16 MED ORDER — CHLORHEXIDINE GLUCONATE 0.12 % MT SOLN
15.0000 mL | Freq: Once | OROMUCOSAL | Status: AC
  Administered 2024-04-16: 15 mL via OROMUCOSAL
  Filled 2024-04-16: qty 15

## 2024-04-16 MED ORDER — PROTAMINE SULFATE 10 MG/ML IV SOLN
INTRAVENOUS | Status: AC
  Filled 2024-04-16: qty 10

## 2024-04-16 MED ORDER — CHLORHEXIDINE GLUCONATE 4 % EX SOLN
30.0000 mL | CUTANEOUS | Status: DC
  Filled 2024-04-16: qty 30

## 2024-04-16 MED ORDER — ALBUMIN HUMAN 5 % IV SOLN: INTRAVENOUS | Status: DC | PRN

## 2024-04-16 MED ORDER — SODIUM CHLORIDE 0.9 % IV SOLN: INTRAVENOUS | Status: DC

## 2024-04-16 MED ORDER — SODIUM BICARBONATE 8.4 % IV SOLN
INTRAVENOUS | Status: DC | PRN
  Administered 2024-04-16 (×4): 50 meq via INTRAVENOUS

## 2024-04-16 MED ORDER — SODIUM BICARBONATE 8.4 % IV SOLN
INTRAVENOUS | Status: AC
  Filled 2024-04-16: qty 150

## 2024-04-16 MED ORDER — PROPOFOL 10 MG/ML IV BOLUS
INTRAVENOUS | Status: DC | PRN
  Administered 2024-04-16: 50 ug via INTRAVENOUS

## 2024-04-16 MED ORDER — LIDOCAINE HCL (PF) 1 % IJ SOLN
INTRAMUSCULAR | Status: AC
  Filled 2024-04-16: qty 30

## 2024-04-16 MED ORDER — EPINEPHRINE 1 MG/10ML IV SOSY
PREFILLED_SYRINGE | INTRAVENOUS | Status: AC
Start: 2024-04-16 — End: 2024-04-16
  Filled 2024-04-16: qty 40

## 2024-04-16 MED ORDER — HEPARIN (PORCINE) IN NACL 1000-0.9 UT/500ML-% IV SOLN
INTRAVENOUS | Status: DC | PRN
  Administered 2024-04-16 (×3): 500 mL

## 2024-04-16 MED ORDER — SODIUM CHLORIDE 0.9% IV SOLUTION: Freq: Once | INTRAVENOUS | Status: DC

## 2024-04-16 MED ORDER — SUCCINYLCHOLINE CHLORIDE 200 MG/10ML IV SOSY
PREFILLED_SYRINGE | INTRAVENOUS | Status: DC | PRN
  Administered 2024-04-16: 100 mg via INTRAVENOUS

## 2024-04-16 MED ORDER — LIDOCAINE HCL (PF) 1 % IJ SOLN
INTRAMUSCULAR | Status: DC | PRN
  Administered 2024-04-16: 12 mL
  Administered 2024-04-16: 5 mL
  Administered 2024-04-16: 12 mL

## 2024-04-16 MED ORDER — EPINEPHRINE HCL 5 MG/250ML IV SOLN IN NS
INTRAVENOUS | Status: DC | PRN
  Administered 2024-04-16: 6 ug/min via INTRAVENOUS

## 2024-04-16 MED ORDER — VASOPRESSIN 20 UNITS/100 ML INFUSION FOR SHOCK
INTRAVENOUS | Status: DC | PRN
  Administered 2024-04-16: .04 [IU]/h via INTRAVENOUS

## 2024-04-16 MED ORDER — CALCIUM CHLORIDE 10 % IV SOLN
INTRAVENOUS | Status: AC
  Filled 2024-04-16: qty 10

## 2024-04-16 MED ORDER — VASOPRESSIN 20 UNIT/ML IV SOLN
INTRAVENOUS | Status: DC | PRN
  Administered 2024-04-16: 5 [IU] via INTRAVENOUS

## 2024-04-16 SURGICAL SUPPLY — 31 items
BAG SNAP BAND KOVER 36X36 (MISCELLANEOUS) ×2 IMPLANT
CABLE ADAPT PACING TEMP 12FT (ADAPTER) IMPLANT
CATH 26 ULTRA DELIVERY (CATHETERS) IMPLANT
CATH DIAG 6FR PIGTAIL ANGLED (CATHETERS) IMPLANT
CATH INFINITI 5FR ANG PIGTAIL (CATHETERS) IMPLANT
CATH INFINITI 6F AL1 (CATHETERS) IMPLANT
CLOSURE PERCLOSE PROSTYLE (Vascular Products) IMPLANT
CRIMPER (MISCELLANEOUS) IMPLANT
DEVICE INFLATION ATRION QL2530 (MISCELLANEOUS) IMPLANT
ELECT DEFIB PAD ADLT CADENCE (PAD) IMPLANT
KIT SAPIAN 3 ULTRA RESILIA 26 (Valve) IMPLANT
PACK CARDIAC CATHETERIZATION (CUSTOM PROCEDURE TRAY) ×1 IMPLANT
SET ATX-X65L (MISCELLANEOUS) IMPLANT
SHEATH INTRODUCER SET 20-26 (SHEATH) IMPLANT
SHEATH PINNACLE 6F 10CM (SHEATH) IMPLANT
SHEATH PINNACLE 8F 10CM (SHEATH) IMPLANT
SHIELD CATH-GARD CONTAMINATION (MISCELLANEOUS) IMPLANT
STOPCOCK MORSE 400PSI 3WAY (MISCELLANEOUS) ×2 IMPLANT
SYR CONTROL 10ML ANGIOGRAPHIC (SYRINGE) IMPLANT
TRANSDUCER W/STOPCOCK (MISCELLANEOUS) IMPLANT
TRAY PERICARDIOCENTESIS 6FX60 (TRAY / TRAY PROCEDURE) IMPLANT
TUBING ART PRESS 72 MALE/FEM (TUBING) IMPLANT
TUBING CIL FLEX 10 FLL-RA (TUBING) IMPLANT
WIRE AMPLATZ SS-J .035X180CM (WIRE) IMPLANT
WIRE EMERALD 3MM-J .035X150CM (WIRE) IMPLANT
WIRE EMERALD 3MM-J .035X260CM (WIRE) IMPLANT
WIRE EMERALD ST .035X260CM (WIRE) IMPLANT
WIRE MICRO SET 5FR 12 (WIRE) IMPLANT
WIRE MICRO SET SILHO 5FR 7 (SHEATH) IMPLANT
WIRE PACING TEMP ST TIP 5 (CATHETERS) IMPLANT
WIRE SAFARI SM CURVE 275 (WIRE) IMPLANT

## 2024-04-17 ENCOUNTER — Encounter (HOSPITAL_COMMUNITY): Payer: Self-pay | Admitting: Internal Medicine

## 2024-04-20 LAB — BPAM RBC
Blood Product Expiration Date: 202511242359
Blood Product Expiration Date: 202511242359
Blood Product Expiration Date: 202511242359
Blood Product Expiration Date: 202511252359
ISSUE DATE / TIME: 202511111104
ISSUE DATE / TIME: 202511111104
ISSUE DATE / TIME: 202511111113
ISSUE DATE / TIME: 202511111113
Unit Type and Rh: 9500
Unit Type and Rh: 9500
Unit Type and Rh: 9500
Unit Type and Rh: 9500

## 2024-04-20 LAB — TYPE AND SCREEN
ABO/RH(D): O NEG
Antibody Screen: NEGATIVE
Unit division: 0
Unit division: 0
Unit division: 0
Unit division: 0

## 2024-04-29 ENCOUNTER — Ambulatory Visit: Admitting: Physician Assistant

## 2024-05-06 NOTE — Op Note (Signed)
 [facility]  TAVR OPERATIVE NOTE     Date of Procedure:                04/07/2024   Preoperative Diagnosis:      Severe Aortic Stenosis    Postoperative Diagnosis:Same and Acute on chronic diastolic heart failure   Procedure:        Transcatheter Aortic Valve Replacement - Transfemoral Approach             Edwards Sapien 3 Resilia THV (size 26mm)              Co-Surgeons:                        Linnie Rayas, MD and Lurena Red, MD Anesthesiologist:                  Erma   Echocardiographer:              Croitoru   Pre-operative Echo Findings: Low flow low gradient aortic stenosis Normal left ventricular systolic function   Post-operative Echo Findings: Pericardial effusion Aortic root rupture        BRIEF CLINICAL NOTE AND INDICATIONS FOR SURGERY    89 y.o. male with severe aortic stenosis.  STS score: 5.38.  NYHA Class III.  The risks and benefits of transfemoral TAVR were discussed in detail.  We also discussed possibility of an emergent sternotomy to address any procedural complications.  Based on our discussion, we collectively decided that an emergent sternotomy would not be indicated.  The patient is agreeable to proceed.  Based on my review of her LHC, echo, and CTA, I agree with the multidisciplinary plan to proceed with a 26mm S3 TAVR.    DETAILS OF THE OPERATIVE PROCEDURE   PREPARATION:   The patient is brought to the operating room on the above mentioned date and central monitoring was established by the anesthesia team. The patient is placed in the supine position on the operating table.  Intravenous antibiotics are administered. Conscious sedation is used.    Baseline transthoracic echocardiogram was performed. The patient's chest, abdomen, both groins, and both lower extremities are prepared and draped in a sterile manner. A time out procedure is performed.     PERIPHERAL ACCESS:   Using the modified Seldinger technique, right internal  jugular venous access was obtained with placement of a 6Fr sheath.  A temporary pacing wire was placed with good capture.  The femoral arterial access were obtained with placement of a 6 Fr sheath in the left femoral artery using u/s guidance.  A pigtail diagnostic catheter was passed through the femoral arterial sheath under fluoroscopic guidance into the aortic root.     TRANSFEMORAL ACCESS:  A micropuncture kit was used to gain access to the right common femoral artery using u/s guidance. Position confirmed with angiography. Pre-closure with double ProGlide closure devices. The patient was heparinized systemically and ACT verified > 250 seconds.     A 14 Fr transfemoral E-sheath was introduced into the right common femoral artery after progressively dilating over an Amplatz superstiff wire. An AL-1 catheter was used to direct a straight-tip exchange length wire across the native aortic valve into the left ventricle. This was exchanged out for a pigtail catheter and position was confirmed in the LV apex. Simultaneous left ventricular, aortic, and left ventricular end-diastolic pressures were recorded.  The pigtail catheter was then exchanged for an Safari wire in the LV  apex.     TRANSCATHETER HEART VALVE DEPLOYMENT:  An Edwards Sapien 3 THV (size 26 mm) was prepared and crimped per manufacturer's guidelines, and the proper orientation of the valve is confirmed on the [employer] delivery system. The valve was advanced through the introducer sheath using normal technique until in an appropriate position in the abdominal aorta beyond the sheath tip. The balloon was then retracted and using the fine-tuning wheel was centered on the valve. The valve was then advanced across the aortic arch using appropriate flexion of the catheter. The valve was carefully positioned across the aortic valve annulus. The Commander catheter was retracted using normal technique. Once final position of the valve has been  confirmed by angiographic assessment, the valve is deployed while temporarily holding ventilation and during rapid ventricular pacing to maintain systolic blood pressure < 50 mmHg and pulse pressure < 10 mmHg. The balloon inflation is held for >3 seconds after reaching full deployment volume. Once the balloon has fully deflated the balloon is retracted into the ascending aorta and valve function is assessed using TTE. A pericardial effusion was noted on exam, and the patient was hypotensive.  He was rapidly intubated and placed on chemical support by anesthesia staff.  Simultaneously, we placed a pericardial drain with immediate return of blood.  We continued to aspirate blood from the pericardial space with limited change in size on TTE.  A TEE probe was placed, and there was a hematoma around the root with an apparent small rupture as well.  At this point, we reversed the heparin  with protamine, and continued to aspirate blood from the pericardial space.  There was still a large amount of blood, which then began to clot.    We then began to perform an anterior lateral thoracotomy in the 4th intercostal space to attempt to evacuate the pericardial clot.  By this time, the patient have little function on TEE.  Since he was not a candidate for sternotomy, we elected to stop any further interventions.    Time of death: 11:22a

## 2024-05-06 NOTE — Anesthesia Postprocedure Evaluation (Signed)
 Anesthesia Post Note  Patient: Peter Gallegos  Procedure(s) Performed: Transcatheter Aortic Valve Replacement, Transfemoral ECHOCARDIOGRAM, TRANSTHORACIC PERICARDIOCENTESIS     Patient location: OR. Anesthesia Type: General Anesthetic complications: no Comments: See intra-op record for details. Pericardial tamponade following valve deployment resulting in cardiovascular collapse. Time of death called at 1122   No notable events documented.  Last Vitals:  Vitals:   05/01/2024 1137 04/25/2024 1142  BP:    Pulse: (!) 0 (!) 0  Resp:    Temp:    SpO2:                [name]

## 2024-05-06 NOTE — Anesthesia Preprocedure Evaluation (Signed)
 Anesthesia Evaluation  Patient identified by MRN, date of birth, ID band Patient awake    Reviewed: Allergy & Precautions, NPO status , Patient's Chart, lab work & pertinent test results  History of Anesthesia Complications (+) PONV and history of anesthetic complications  Airway Mallampati: III  TM Distance: >3 FB Neck ROM: Full   Comment: Large beard Dental  (+) Edentulous Upper, Edentulous Lower   Pulmonary former smoker   Pulmonary exam normal breath sounds clear to auscultation       Cardiovascular hypertension, Pt. on medications Normal cardiovascular exam+ dysrhythmias + Valvular Problems/Murmurs AS  Rhythm:Regular Rate:Normal  Sigmoid septum, no evidence of obstruction while at rest on most recent echo  Normal LV/RV, severe AS.   TTE: IMPRESSIONS     1. Left ventricular ejection fraction, by estimation, is 60 to 65%. Left  ventricular ejection fraction by 3D volume is 61 %. The left ventricle has  normal function. The left ventricle has no regional wall motion  abnormalities. There is moderate  asymmetric left ventricular hypertrophy of the basal-septal segment. Left  ventricular diastolic parameters are indeterminate.   2. Right ventricular systolic function was not well visualized. The right  ventricular size is not well visualized. Tricuspid regurgitation signal is  inadequate for assessing PA pressure.   3. The mitral valve is normal in structure. Mild to moderate mitral valve  regurgitation. No evidence of mitral stenosis.   4. The aortic valve is tricuspid. There is severe calcifcation of the  aortic valve. Aortic valve regurgitation is mild. Severe aortic valve  stenosis. Mild AS by gradients (Vmax 2.6 m/s, MG ), but severe by  AVA (0.7cm^2) and DI (0.22). Low SV index   (26 cc/m^2), suspect paradoxical low flow low gradient severe AS      Neuro/Psych  PSYCHIATRIC DISORDERS Anxiety Depression     TIA   GI/Hepatic ,GERD  ,,(+)     Substance abuse: hx etOH abuse remote past.    Endo/Other  negative endocrine ROS    Renal/GU negative Renal ROS  negative genitourinary   Musculoskeletal   Abdominal   Peds  Hematology  (+) Blood dyscrasia, anemia   Anesthesia Other Findings   Reproductive/Obstetrics negative OB ROS                              Anesthesia Physical Anesthesia Plan  ASA: 4  Anesthesia Plan: MAC   Post-op Pain Management:    Induction:   PONV Risk Score and Plan: 2 and Propofol  infusion and TIVA  Airway Management Planned: Natural Airway and Simple Face Mask  Additional Equipment: None  Intra-op Plan:   Post-operative Plan:   Informed Consent: I have reviewed the patients History and Physical, chart, labs and discussed the procedure including the risks, benefits and alternatives for the proposed anesthesia with the patient or authorized representative who has indicated his/her understanding and acceptance.   Patient has DNR.  Discussed DNR with patient and Suspend DNR.   Dental advisory given  Plan Discussed with: CRNA  Anesthesia Plan Comments:          Anesthesia Quick Evaluation

## 2024-05-06 NOTE — Progress Notes (Signed)
  Echocardiogram Echocardiogram Transesophageal has been performed.  Peter Gallegos 04/07/2024, 11:38 AM

## 2024-05-06 NOTE — Progress Notes (Signed)
 This chaplain responded to MD page for family spiritual support. The chaplain understands from the MD the Pt. died during the procedure.   The Pt. wife-Darlene declined spiritual care and asked for assistance phoning [employer] with the news of the Pt. death. The chaplain completed the request and understands [facility] has reached out to the family.  This chaplain is available for F/U spiritual care as needed.  Chaplain Leeroy Hummer [phone]

## 2024-05-06 NOTE — Progress Notes (Signed)
 Pt. Belong bag retrieved from PACU - by MYRTIS Dirks, RN. This RN opened the bag to take inventory: Green sweat shirt, White T shirt, flannel ( red) bottoms, brown slippers, black pair of sox. This was done with witness K. Cornelius- no valuables appreciated. Tinnie Daring, RN made aware- will contact Mrs. Georgina and make her aware.

## 2024-05-06 NOTE — Transfer of Care (Signed)
 Immediate Anesthesia Transfer of Care Note  Patient: Peter Gallegos  Procedure(s) Performed: Transcatheter Aortic Valve Replacement, Transfemoral ECHOCARDIOGRAM, TRANSTHORACIC PERICARDIOCENTESIS  Patient Location: deceased Anesthesia Type:deceased  Level of Consciousness: Patient remains intubated per anesthesia plan  Airway & Oxygen Therapy: deceased  Post-op Assessment: deceased  Post vital signs: unstable  Last Vitals:  Vitals Value Taken Time  BP na   Temp na   Pulse na   Resp na   SpO2 na     Last Pain:  Vitals:   04/13/2024 0808  TempSrc:   PainSc: 0-No pain         Complications: No notable events documented.

## 2024-05-06 NOTE — Progress Notes (Signed)
 Pt post mortem care performed by team.Pt. transferred to morgue; the were not any belongings identified with patient or at time of transfer of care.

## 2024-05-06 NOTE — Op Note (Signed)
 [facility]  TAVR OPERATIVE NOTE   Date of Procedure:  04/24/2024  Preoperative Diagnosis: Severe Aortic Stenosis   Postoperative Diagnosis: Aortic root rupture/injury with pericardial tamponade  Procedure:   Transcatheter Aortic Valve Replacement - Transfemoral Approach  Edwards Sapien 3 Resilia THV (size 26 mm, model # [id number])   Co-Surgeons:  Linnie Rayas, MD and Lurena Red, MD Anesthesiologist:  Erma  Echocardiographer:  Croitoru  Pre-operative Echo Findings: Severe aortic stenosis Normal left ventricular systolic function  Post-operative Echo Findings: No paravalvular leak Normal left ventricular systolic function Pericardial effusion Aortic root hematoma  Left Heart Catheterization Findings: Left ventricular end-diastolic pressure of   BRIEF CLINICAL NOTE AND INDICATIONS FOR SURGERY  The patient is an 89 year old male with a history of hypertension, hyperlipidemia, TIA, and low-flow low gradient aortic stenosis who was referred for elective transcatheter valve replacement with a 26 mm SAPIEN 3 valve from the right transfemoral approach.  Due to his underlying conduction disease a right IJ temporary pacemaker was planned as well.  During the course of the patient's preoperative work up they have been evaluated comprehensively by a multidisciplinary team of specialists coordinated through the [facility] in the [facility].  They have been demonstrated to suffer from symptomatic severe aortic stenosis as noted above. The patient has been counseled extensively as to the relative risks and benefits of all options for the treatment of severe aortic stenosis including long term medical therapy, conventional surgery for aortic valve replacement, and transcatheter aortic valve replacement.  The patient has been independently evaluated by Dr. Rayas with CT surgery and they are felt to be at high  risk for conventional surgical aortic valve replacement. The surgeon indicated the patient would be a poor candidate for conventional surgery. Based upon review of all of the patient's preoperative diagnostic tests they are felt to be candidate for transcatheter aortic valve replacement using the transfemoral approach as an alternative to high risk conventional surgery.    Following the decision to proceed with transcatheter aortic valve replacement, a discussion has been held regarding what types of management strategies would be attempted intraoperatively in the event of life-threatening complications, including whether or not the patient would be considered a candidate for the use of cardiopulmonary bypass and/or conversion to open sternotomy for attempted surgical intervention.  The patient has been advised of a variety of complications that might develop peculiar to this approach including but not limited to risks of death, stroke, paravalvular leak, aortic dissection or other major vascular complications, aortic annulus rupture, device embolization, cardiac rupture or perforation, acute myocardial infarction, arrhythmia, heart block or bradycardia requiring permanent pacemaker placement, congestive heart failure, respiratory failure, renal failure, pneumonia, infection, other late complications related to structural valve deterioration or migration, or other complications that might ultimately cause a temporary or permanent loss of functional independence or other long term morbidity.  The patient provides full informed consent for the procedure as described and all questions were answered preoperatively.    DETAILS OF THE OPERATIVE PROCEDURE  PREPARATION:   The patient is brought to the operating room on the above mentioned date and central monitoring was established by the anesthesia team. The patient is placed in the supine position on the operating table.  Intravenous antibiotics are administered.  Conscious sedation is used.   Baseline transthoracic echocardiogram was performed. The patient's chest, abdomen, both groins, and both lower extremities are prepared and draped in a sterile manner. A time  out procedure is performed.   PERIPHERAL ACCESS:   Using the modified Seldinger technique, femoral arterial and venous access were obtained with placement of a 6 Fr sheath in the left common femoral artery and a 6 Fr sheath in the right internal jugular vein using u/s guidance.  A pigtail diagnostic catheter was passed through the femoral arterial sheath under fluoroscopic guidance into the aortic root.  A temporary transvenous pacemaker catheter was passed through the jugular venous sheath under fluoroscopic guidance into the right ventricle.  The pacemaker was tested to ensure stable lead placement and pacemaker capture. Aortic root angiography was performed in order to determine the optimal angiographic angle for valve deployment.  TRANSFEMORAL ACCESS:  A micropuncture kit was used to gain access to the right common femoral artery using u/s guidance. Position confirmed with angiography. Pre-closure with double ProGlide closure devices. The patient was heparinized systemically and ACT verified > 250 seconds.    A 14 Fr transfemoral E-sheath was introduced into the right common femoral artery after progressively dilating over an Amplatz superstiff wire. An AL-1 catheter was used to direct a straight-tip exchange length wire across the native aortic valve into the left ventricle. This was exchanged out for a pigtail catheter and position was confirmed in the LV apex. Simultaneous left ventricular, aortic, and left ventricular end-diastolic pressures were recorded.  The pigtail catheter was then exchanged for an Safari wire in the LV apex.    TRANSCATHETER HEART VALVE DEPLOYMENT:  An Edwards Sapien 3 THV (size 26 mm) was prepared and crimped per manufacturer's guidelines, and the proper orientation of  the valve is confirmed on the [employer] delivery system. The valve was advanced through the introducer sheath using normal technique until in an appropriate position in the abdominal aorta beyond the sheath tip. The balloon was then retracted and using the fine-tuning wheel was centered on the valve. The valve was then advanced across the aortic arch using appropriate flexion of the catheter. The valve was carefully positioned across the aortic valve annulus. The Commander catheter was retracted using normal technique. Once final position of the valve has been confirmed by angiographic assessment, the valve is deployed while temporarily holding ventilation and during rapid ventricular pacing to maintain systolic blood pressure < 50 mmHg and pulse pressure < 10 mmHg. The balloon inflation is held for >3 seconds after reaching full deployment volume. Once the balloon has fully deflated the balloon is retracted into the ascending aorta and valve function is assessed using TTE. There is felt to be no paravalvular leak and no central aortic insufficiency.  Further echocardiographic imaging demonstrated an enlarging pericardial effusion.  Because of this along micropuncture needle was used to gain access to the pericardial space and a drain was placed.  We then actively evacuated blood from the pericardial space.  A TEE was then performed which showed some improvement in the pericardial effusion.  This allowed us  to assess for other etiologies of the pericardial effusion other than a wire perforation.  An aortic root hematoma was seen.  At this point in time anesthesia intubated the patient and manage them on pressors.  Given the fact the patient was not open sternotomy candidate we thought it best to try to reverse the anticoagulation in hopes of in hopes of her perhaps stabilizing the ongoing bleeding from the aortic root.  Protamine was administered and the 16 French femoral sheath was removed from the body  with hemostasis obtained.  Unfortunately we saw no improvement in  the patient's hemodynamics and worsening cardiac output based on the patient's no low heart rate and impaired contractile function.  Dr. Shyrl then tried to obtain access to the pericardium in hopes of manually evacuating what we thought was probably clot in the pericardium.  His limited sternotomy did not allow him adequate access to the pericardium.  The patient had already been judged to not be a bailout candidate however we did discuss the option of an open sternotomy to evacuate what we thought was clotted blood in the pericardium.  At this point in time we did not think there was a great chance for meaningful salvage.  Further resuscitative efforts were therefore suspended.  Time of death was 11:22AM on 04-21-2024.    Enos Muhl K Diem Dicocco MD 04/21/2024 12:44 PM

## 2024-05-06 NOTE — Interval H&P Note (Signed)
 History and Physical Interval Note:  04/10/2024 9:38 AM  Peter Gallegos  has presented today for surgery, with the diagnosis of Severe Aortic Stenosis.  The various methods of treatment have been discussed with the patient and family. After consideration of risks, benefits and other options for treatment, the patient has consented to  Procedure(s): Transcatheter Aortic Valve Replacement, Transfemoral (N/A) ECHOCARDIOGRAM, TRANSTHORACIC (N/A) as a surgical intervention.  The patient's history has been reviewed, patient examined, no change in status, stable for surgery.  I have reviewed the patient's chart and labs.  Questions were answered to the patient's satisfaction.     [name] MALVA Rayas

## 2024-05-06 DEATH — deceased

## 2024-05-15 ENCOUNTER — Ambulatory Visit: Admitting: Podiatry

## 2024-05-23 ENCOUNTER — Ambulatory Visit (HOSPITAL_COMMUNITY)

## 2024-05-23 ENCOUNTER — Ambulatory Visit: Admitting: Physician Assistant

## 2024-06-06 NOTE — Death Summary Note (Signed)
 Death Summary:  Please see H&P for admission details.   Hospital course: The patient is an 89 year old male with a history of hypertension, hyperlipidemia, TIA, and low-flow low gradient aortic stenosis who was referred for elective transcatheter valve replacement with a 26 mm SAPIEN 3 valve from the right transfemoral approach. Due to his underlying conduction disease a right IJ temporary pacemaker was planned as well.   During TAVR procedure, patient developed aortic root rupture and a large pericardial effusion.  Pericardiocentesis was performed.  Fortunately patient became hypotensive.  Despite multiple blood transfusions and pressors, patient's condition continued to deteriorate.  We reversed the patient's heparinization with protamine .  We attempted to obtain surgical access to the pericardium in order to manually evacuate the thrombus but surgical access could not be performed feasibly given the position of the patient's pericardium and his chest.  Further maneuvers were therefore deferred.  The patient's time of death was 11:22 AM on April 26, 2024.

## 2024-06-14 ENCOUNTER — Ambulatory Visit: Admitting: Gastroenterology
